# Patient Record
Sex: Male | Born: 1960 | Race: Black or African American | Hispanic: No | Smoking: Current every day smoker
Health system: Southern US, Community
[De-identification: ages and names within clinical notes are randomized; demographics above are authoritative.]

## PROBLEM LIST (undated history)

## (undated) ENCOUNTER — Ambulatory Visit: Discharge status: Absent without leave | Payer: No Typology Code available for payment source

## (undated) DIAGNOSIS — M549 Dorsalgia, unspecified: Secondary | ICD-10-CM

## (undated) DIAGNOSIS — G8929 Other chronic pain: Secondary | ICD-10-CM

## (undated) DIAGNOSIS — F329 Major depressive disorder, single episode, unspecified: Secondary | ICD-10-CM

## (undated) DIAGNOSIS — G4730 Sleep apnea, unspecified: Secondary | ICD-10-CM

## (undated) DIAGNOSIS — F4310 Post-traumatic stress disorder, unspecified: Secondary | ICD-10-CM

## (undated) DIAGNOSIS — I10 Essential (primary) hypertension: Secondary | ICD-10-CM

## (undated) DIAGNOSIS — J449 Chronic obstructive pulmonary disease, unspecified: Secondary | ICD-10-CM

## (undated) DIAGNOSIS — C801 Malignant (primary) neoplasm, unspecified: Secondary | ICD-10-CM

## (undated) DIAGNOSIS — F32A Depression, unspecified: Secondary | ICD-10-CM

## (undated) HISTORY — PX: COLONOSCOPY: SHX174

## (undated) HISTORY — PX: TONSILLECTOMY: SUR1361

## (undated) HISTORY — PX: APPENDECTOMY: SHX54

---

## 2001-02-22 ENCOUNTER — Encounter: Payer: Self-pay | Admitting: Emergency Medicine

## 2001-02-22 ENCOUNTER — Emergency Department (HOSPITAL_COMMUNITY): Admission: EM | Admit: 2001-02-22 | Discharge: 2001-02-22 | Payer: Self-pay | Admitting: Emergency Medicine

## 2001-04-25 ENCOUNTER — Emergency Department (HOSPITAL_COMMUNITY): Admission: EM | Admit: 2001-04-25 | Discharge: 2001-04-25 | Payer: Self-pay | Admitting: Emergency Medicine

## 2001-05-13 ENCOUNTER — Emergency Department (HOSPITAL_COMMUNITY): Admission: EM | Admit: 2001-05-13 | Discharge: 2001-05-13 | Payer: Self-pay | Admitting: *Deleted

## 2001-10-05 ENCOUNTER — Emergency Department (HOSPITAL_COMMUNITY): Admission: EM | Admit: 2001-10-05 | Discharge: 2001-10-05 | Payer: Self-pay | Admitting: Emergency Medicine

## 2002-03-24 ENCOUNTER — Emergency Department (HOSPITAL_COMMUNITY): Admission: EM | Admit: 2002-03-24 | Discharge: 2002-03-25 | Payer: Self-pay | Admitting: *Deleted

## 2002-04-03 ENCOUNTER — Emergency Department (HOSPITAL_COMMUNITY): Admission: EM | Admit: 2002-04-03 | Discharge: 2002-04-03 | Payer: Self-pay | Admitting: Internal Medicine

## 2003-04-05 ENCOUNTER — Encounter: Payer: Self-pay | Admitting: Emergency Medicine

## 2003-04-05 ENCOUNTER — Emergency Department (HOSPITAL_COMMUNITY): Admission: EM | Admit: 2003-04-05 | Discharge: 2003-04-05 | Payer: Self-pay | Admitting: Emergency Medicine

## 2003-11-25 ENCOUNTER — Emergency Department (HOSPITAL_COMMUNITY): Admission: EM | Admit: 2003-11-25 | Discharge: 2003-11-25 | Payer: Self-pay | Admitting: Emergency Medicine

## 2004-08-13 ENCOUNTER — Emergency Department (HOSPITAL_COMMUNITY): Admission: EM | Admit: 2004-08-13 | Discharge: 2004-08-13 | Payer: Self-pay

## 2004-08-16 ENCOUNTER — Emergency Department (HOSPITAL_COMMUNITY): Admission: EM | Admit: 2004-08-16 | Discharge: 2004-08-16 | Payer: Self-pay | Admitting: Emergency Medicine

## 2006-11-14 ENCOUNTER — Emergency Department (HOSPITAL_COMMUNITY): Admission: EM | Admit: 2006-11-14 | Discharge: 2006-11-14 | Payer: Self-pay | Admitting: Emergency Medicine

## 2007-05-17 ENCOUNTER — Emergency Department (HOSPITAL_COMMUNITY): Admission: EM | Admit: 2007-05-17 | Discharge: 2007-05-17 | Payer: Self-pay | Admitting: Emergency Medicine

## 2007-05-19 ENCOUNTER — Emergency Department (HOSPITAL_COMMUNITY): Admission: EM | Admit: 2007-05-19 | Discharge: 2007-05-19 | Payer: Self-pay | Admitting: Emergency Medicine

## 2008-02-23 ENCOUNTER — Emergency Department (HOSPITAL_COMMUNITY): Admission: EM | Admit: 2008-02-23 | Discharge: 2008-02-23 | Payer: Self-pay | Admitting: Emergency Medicine

## 2008-02-28 ENCOUNTER — Emergency Department (HOSPITAL_COMMUNITY): Admission: EM | Admit: 2008-02-28 | Discharge: 2008-02-28 | Payer: Self-pay | Admitting: Emergency Medicine

## 2008-12-23 ENCOUNTER — Emergency Department (HOSPITAL_COMMUNITY): Admission: EM | Admit: 2008-12-23 | Discharge: 2008-12-23 | Payer: Self-pay | Admitting: Emergency Medicine

## 2011-07-06 ENCOUNTER — Encounter: Payer: Self-pay | Admitting: *Deleted

## 2011-07-06 ENCOUNTER — Emergency Department (HOSPITAL_COMMUNITY)
Admission: EM | Admit: 2011-07-06 | Discharge: 2011-07-07 | Disposition: A | Discharge status: Rehabilitation Facility | Payer: Non-veteran care | Attending: Emergency Medicine | Admitting: Emergency Medicine

## 2011-07-06 ENCOUNTER — Emergency Department (HOSPITAL_COMMUNITY): Payer: Non-veteran care

## 2011-07-06 DIAGNOSIS — G8929 Other chronic pain: Secondary | ICD-10-CM | POA: Insufficient documentation

## 2011-07-06 DIAGNOSIS — F17200 Nicotine dependence, unspecified, uncomplicated: Secondary | ICD-10-CM | POA: Insufficient documentation

## 2011-07-06 DIAGNOSIS — F1010 Alcohol abuse, uncomplicated: Secondary | ICD-10-CM | POA: Insufficient documentation

## 2011-07-06 DIAGNOSIS — M549 Dorsalgia, unspecified: Secondary | ICD-10-CM | POA: Insufficient documentation

## 2011-07-06 DIAGNOSIS — F1910 Other psychoactive substance abuse, uncomplicated: Secondary | ICD-10-CM

## 2011-07-06 HISTORY — DX: Essential (primary) hypertension: I10

## 2011-07-06 HISTORY — DX: Chronic obstructive pulmonary disease, unspecified: J44.9

## 2011-07-06 LAB — BASIC METABOLIC PANEL
CO2: 25 mEq/L (ref 19–32)
Chloride: 100 mEq/L (ref 96–112)
Creatinine, Ser: 0.7 mg/dL (ref 0.50–1.35)
GFR calc Af Amer: >90 mL/min (ref >90)
Potassium: 3.9 mEq/L (ref 3.5–5.1)
Sodium: 139 mEq/L (ref 135–145)

## 2011-07-06 LAB — CBC
MCHC: 35 g/dL (ref 30.0–36.0)
Platelets: 180 10*3/uL (ref 150–400)
RDW: 14.6 % (ref 11.5–15.5)
WBC: 8.9 10*3/uL (ref 4.0–10.5)

## 2011-07-06 LAB — DIFFERENTIAL
Basophils Absolute: 0.1 10*3/uL (ref 0.0–0.1)
Basophils Relative: 1 % (ref 0–1)
Lymphocytes Relative: 30 % (ref 12–46)
Neutro Abs: 5.4 10*3/uL (ref 1.7–7.7)
Neutrophils Relative %: 60 % (ref 43–77)

## 2011-07-06 LAB — URINALYSIS, ROUTINE W REFLEX MICROSCOPIC
Bilirubin Urine: NEGATIVE
Leukocytes, UA: NEGATIVE
Nitrite: NEGATIVE
Specific Gravity, Urine: 1.02 (ref 1.005–1.030)
Urobilinogen, UA: 0.2 mg/dL (ref 0.0–1.0)

## 2011-07-06 LAB — HEPATIC FUNCTION PANEL
ALT: 47 U/L (ref 0–53)
AST: 56 U/L — ABNORMAL HIGH (ref 0–37)
Alkaline Phosphatase: 88 U/L (ref 39–117)
Bilirubin, Direct: 0.1 mg/dL (ref 0.0–0.3)
Total Protein: 7.9 g/dL (ref 6.0–8.3)

## 2011-07-06 LAB — ETHANOL: Alcohol, Ethyl (B): 273 mg/dL — ABNORMAL HIGH (ref 0–11)

## 2011-07-06 LAB — RAPID URINE DRUG SCREEN, HOSP PERFORMED
Barbiturates: NOT DETECTED
Cocaine: POSITIVE — AB

## 2011-07-06 MED ORDER — THIAMINE HCL 100 MG/ML IJ SOLN
100.0000 mg | Freq: Every day | INTRAMUSCULAR | Status: DC
  Administered 2011-07-06: 100 mg via INTRAVENOUS
  Filled 2011-07-06: qty 2

## 2011-07-06 MED ORDER — FOLIC ACID 1 MG PO TABS
1.0000 mg | ORAL_TABLET | Freq: Every day | ORAL | Status: DC
  Administered 2011-07-06: 1 mg via ORAL
  Filled 2011-07-06: qty 1

## 2011-07-06 MED ORDER — ACETAMINOPHEN 325 MG PO TABS: 650.0000 mg | ORAL_TABLET | ORAL | Status: DC | PRN

## 2011-07-06 MED ORDER — VITAMIN B-1 100 MG PO TABS
100.0000 mg | ORAL_TABLET | Freq: Every day | ORAL | Status: DC
  Filled 2011-07-06: qty 1

## 2011-07-06 MED ORDER — ZOLPIDEM TARTRATE 5 MG PO TABS: 5.0000 mg | ORAL_TABLET | Freq: Every evening | ORAL | Status: DC | PRN

## 2011-07-06 MED ORDER — LORAZEPAM 1 MG PO TABS: 1.0000 mg | ORAL_TABLET | Freq: Four times a day (QID) | ORAL | Status: DC | PRN

## 2011-07-06 MED ORDER — IBUPROFEN 400 MG PO TABS: 600.0000 mg | ORAL_TABLET | Freq: Three times a day (TID) | ORAL | Status: DC | PRN

## 2011-07-06 MED ORDER — LORAZEPAM 2 MG/ML IJ SOLN: 1.0000 mg | Freq: Four times a day (QID) | INTRAMUSCULAR | Status: DC | PRN

## 2011-07-06 MED ORDER — ALUM & MAG HYDROXIDE-SIMETH 200-200-20 MG/5ML PO SUSP: 30.0000 mL | ORAL | Status: DC | PRN

## 2011-07-06 MED ORDER — LORAZEPAM 1 MG PO TABS: 1.0000 mg | ORAL_TABLET | Freq: Three times a day (TID) | ORAL | Status: DC | PRN

## 2011-07-06 MED ORDER — ONDANSETRON HCL 4 MG PO TABS: 4.0000 mg | ORAL_TABLET | Freq: Three times a day (TID) | ORAL | Status: DC | PRN

## 2011-07-06 MED ORDER — NICOTINE 21 MG/24HR TD PT24
21.0000 mg | MEDICATED_PATCH | Freq: Every day | TRANSDERMAL | Status: DC
  Administered 2011-07-06: 21 mg via TRANSDERMAL
  Filled 2011-07-06: qty 1

## 2011-07-06 MED ORDER — THERA M PLUS PO TABS
1.0000 | ORAL_TABLET | Freq: Every day | ORAL | Status: DC
  Administered 2011-07-06: 1 via ORAL
  Filled 2011-07-06: qty 1

## 2011-07-06 NOTE — ED Notes (Signed)
Resting quietly in bed with eyes closed. Resp even and unlabored. No s/s pain or discomfort noted.

## 2011-07-06 NOTE — ED Notes (Signed)
Shon Baton (ACT) is coming to see the PT.

## 2011-07-06 NOTE — ED Notes (Signed)
Pt standing eating lunch. Officer at bedside. Pt made aware of process regarding eval

## 2011-07-06 NOTE — ED Notes (Signed)
Remains A&Ox3. Skin w/p/d. Resp even and unlabored. Denies pain. NAD noted at this time.

## 2011-07-06 NOTE — ED Provider Notes (Signed)
History     CSN: [id number] Arrival date & time: 07/06/2011  1:16 PM  Chief Complaint  Patient presents with  . Psychiatric Evaluation    (Consider location/radiation/quality/duration/timing/severity/associated sxs/prior treatment) HPI Comments: Patient presents via [facility] for psychiatric evaluation. Involuntary commitment papers were filed by the patient's mother. Patient's mother states that he has a hard time remembering things and has a severe drug problem. States he uses alcohol as well as every drug on the market. CT most had a hard time remembering when he is under the influence of substances. He at times is violence. When talking to the patient he states that he intermittently has thoughts of self harm as well as harm to others although he has no specific plans or individuals - states has these thoughts when intoxicated which he was on arrival. He has no medical complaints. His last drink was today.  The history is provided by the patient and the police. No language interpreter was used.    Past Medical History  Diagnosis Date  . COPD (chronic obstructive pulmonary disease)   . Hypertension     Past Surgical History  Procedure Date  . Appendectomy   . Tonsillectomy     History reviewed. No pertinent family history.  History  Substance Use Topics  . Smoking status: Current Everyday Smoker -- 1.5 packs/day  . Smokeless tobacco: Not on file  . Alcohol Use: Yes     4 40 oz beers per day.       Review of Systems  Constitutional: Negative for fever, activity change, appetite change and fatigue.  HENT: Negative for congestion, sore throat, rhinorrhea, neck pain and neck stiffness.   Respiratory: Negative for cough, chest tightness and shortness of breath.   Cardiovascular: Negative for chest pain and palpitations.  Gastrointestinal: Negative for nausea, vomiting and abdominal pain.  Genitourinary: Negative for dysuria, urgency, frequency and flank pain.    Musculoskeletal: Positive for back pain (chronic). Negative for gait problem.  Neurological: Negative for dizziness, weakness, light-headedness and headaches.       +memory difficulty  Psychiatric/Behavioral: Positive for suicidal ideas (vague), confusion and agitation. Negative for hallucinations. The patient is not nervous/anxious.   All other systems reviewed and are negative.    Allergies  Review of patient's allergies indicates no known allergies.  Home Medications  No current outpatient prescriptions on file.  BP 155/84  Pulse 85  Temp(Src) 98.2 F (36.8 C) (Oral)  Resp 20  Ht 5\' 8"  (1.727 m)  Wt 163 lb (73.936 kg)  BMI 24.78 kg/m2  SpO2 99%  Physical Exam  Nursing note and vitals reviewed. Constitutional: He is oriented to person, place, and time. He appears well-developed and well-nourished. No distress.       Alcohol on the breath  HENT:  Head: Normocephalic and atraumatic.  Mouth/Throat: Oropharynx is clear and moist.  Eyes: Conjunctivae and EOM are normal. Pupils are equal, round, and reactive to light.  Neck: Normal range of motion. Neck supple.  Cardiovascular: Normal rate, regular rhythm, normal heart sounds and intact distal pulses.  Exam reveals no gallop and no friction rub.   No murmur heard. Pulmonary/Chest: Effort normal and breath sounds normal. No respiratory distress. He has no wheezes. He has no rales.  Abdominal: Soft. Bowel sounds are normal. There is no tenderness.  Musculoskeletal: Normal range of motion. He exhibits no tenderness.  Neurological: He is alert and oriented to person, place, and time.  Skin: Skin is warm and dry. No rash  noted.    ED Course  Procedures (including critical care time)  Labs Reviewed  BASIC METABOLIC PANEL - Abnormal; Notable for the following:    Glucose, Bld 110 (*)    All other components within normal limits  ETHANOL - Abnormal; Notable for the following:    Alcohol, Ethyl (B) 273 (*)    All other  components within normal limits  URINE RAPID DRUG SCREEN (HOSP PERFORMED) - Abnormal; Notable for the following:    Cocaine POSITIVE (*)    All other components within normal limits  HEPATIC FUNCTION PANEL - Abnormal; Notable for the following:    AST 56 (*)    All other components within normal limits  CBC  DIFFERENTIAL  URINALYSIS, ROUTINE W REFLEX MICROSCOPIC   Ct Head Wo Contrast  07/06/2011  *RADIOLOGY REPORT*  Clinical Data: 50 year old male - altered mental status.  CT HEAD WITHOUT CONTRAST  Technique:  Contiguous axial images were obtained from the base of the skull through the vertex without contrast.  Comparison: None  Findings: No acute intracranial abnormalities are identified, including mass lesion or mass effect, hydrocephalus, extra-axial fluid collection, midline shift, hemorrhage, or acute infarction.  The visualized bony calvarium is unremarkable.  IMPRESSION: No evidence of intracranial abnormality.  Original Report Authenticated By: Rosendo Gros, M.D.     1. Alcohol abuse   2. Polysubstance abuse       MDM  Medical clearance labs were obtained. Given the increased confusion I also obtained a head CT. Head CT was negative. Medical clearance labs reviewed relatively unremarkable except for an alcohol level of 270. The patient did arrive intoxicated. I feel that the majority of his symptoms are likely intoxication related. I will consult the behavioral health team for further evaluation. I do not feel the patient is a serious threat to himself or others as he only has vague thoughts when intoxicated. He has no medical complaints. The patient was brought by involuntary commitment papers. Patient has no criteria for this and the paperwork will be rescinded. When the patient discussed his overall care with the behavioral health team he states he wishes to have help with detox. At this time we are awaiting to hear back from 2 facilities on whether or not there is an inpatient bed  for which he can receive detox. Once again the patient is not a harm to himself or others. Psych holding orders were placed. Patient was also placed in ciwa precautions. Patient will be transferred to the facility who accepts him for inpatient rehabilitation        Dayton Bailiff, MD 07/06/11 2118

## 2011-07-06 NOTE — ED Notes (Signed)
Pt brought in by police for involuntary committment. Pt drinks approximately 4 40 oz beers per day. Per patients mother he has a hard time remembering things. Pt states that he likes drinking.

## 2011-07-08 ENCOUNTER — Emergency Department: Payer: Self-pay | Admitting: Emergency Medicine

## 2011-07-13 LAB — COMPREHENSIVE METABOLIC PANEL
CO2: 27
Calcium: 9.1
Creatinine, Ser: 0.63
GFR calc non Af Amer: >60
Glucose, Bld: 104 — ABNORMAL HIGH
Total Protein: 7.1

## 2011-07-13 LAB — DIFFERENTIAL
Lymphocytes Relative: 26
Lymphs Abs: 2.1
Monocytes Relative: 9
Neutro Abs: 5
Neutrophils Relative %: 61

## 2011-07-13 LAB — CBC
Hemoglobin: 13.9
MCHC: 33.7
MCV: 87.9
RBC: 4.69
RDW: 14.4 — ABNORMAL HIGH

## 2011-07-13 LAB — APTT: aPTT: 31

## 2011-07-13 LAB — PROTIME-INR
INR: 0.9
Prothrombin Time: 12.1

## 2011-12-04 ENCOUNTER — Emergency Department (HOSPITAL_COMMUNITY)
Admission: EM | Admit: 2011-12-04 | Discharge: 2011-12-05 | Disposition: A | Discharge status: Home / Self Care | Payer: Non-veteran care | Attending: Emergency Medicine | Admitting: Emergency Medicine

## 2011-12-04 ENCOUNTER — Encounter (HOSPITAL_COMMUNITY): Payer: Self-pay | Admitting: *Deleted

## 2011-12-04 DIAGNOSIS — F17200 Nicotine dependence, unspecified, uncomplicated: Secondary | ICD-10-CM | POA: Insufficient documentation

## 2011-12-04 DIAGNOSIS — F329 Major depressive disorder, single episode, unspecified: Secondary | ICD-10-CM | POA: Insufficient documentation

## 2011-12-04 DIAGNOSIS — F1010 Alcohol abuse, uncomplicated: Secondary | ICD-10-CM | POA: Insufficient documentation

## 2011-12-04 DIAGNOSIS — I10 Essential (primary) hypertension: Secondary | ICD-10-CM | POA: Insufficient documentation

## 2011-12-04 DIAGNOSIS — J4489 Other specified chronic obstructive pulmonary disease: Secondary | ICD-10-CM | POA: Insufficient documentation

## 2011-12-04 DIAGNOSIS — F1410 Cocaine abuse, uncomplicated: Secondary | ICD-10-CM | POA: Insufficient documentation

## 2011-12-04 DIAGNOSIS — J449 Chronic obstructive pulmonary disease, unspecified: Secondary | ICD-10-CM | POA: Insufficient documentation

## 2011-12-04 DIAGNOSIS — F3289 Other specified depressive episodes: Secondary | ICD-10-CM | POA: Insufficient documentation

## 2011-12-04 HISTORY — DX: Post-traumatic stress disorder, unspecified: F43.10

## 2011-12-04 HISTORY — DX: Depression, unspecified: F32.A

## 2011-12-04 HISTORY — DX: Major depressive disorder, single episode, unspecified: F32.9

## 2011-12-04 LAB — COMPREHENSIVE METABOLIC PANEL
ALT: 31 U/L (ref 0–53)
AST: 31 U/L (ref 0–37)
Alkaline Phosphatase: 88 U/L (ref 39–117)
CO2: 30 mEq/L (ref 19–32)
Calcium: 8.7 mg/dL (ref 8.4–10.5)
Chloride: 101 mEq/L (ref 96–112)
GFR calc non Af Amer: 75 mL/min — ABNORMAL LOW (ref >90)
Potassium: 3.9 mEq/L (ref 3.5–5.1)
Sodium: 138 mEq/L (ref 135–145)

## 2011-12-04 LAB — CBC
MCH: 27.9 pg (ref 26.0–34.0)
Platelets: 213 10*3/uL (ref 150–400)
RBC: 4.88 MIL/uL (ref 4.22–5.81)
WBC: 7.9 10*3/uL (ref 4.0–10.5)

## 2011-12-04 LAB — ETHANOL: Alcohol, Ethyl (B): 20 mg/dL — ABNORMAL HIGH (ref 0–11)

## 2011-12-04 LAB — RAPID URINE DRUG SCREEN, HOSP PERFORMED
Barbiturates: NOT DETECTED
Cocaine: POSITIVE — AB
Tetrahydrocannabinol: NOT DETECTED

## 2011-12-04 MED ORDER — FOLIC ACID 1 MG PO TABS
1.0000 mg | ORAL_TABLET | Freq: Every day | ORAL | Status: DC
  Administered 2011-12-04 – 2011-12-05 (×2): 1 mg via ORAL
  Filled 2011-12-04 (×2): qty 1

## 2011-12-04 MED ORDER — IBUPROFEN 600 MG PO TABS: 600.0000 mg | ORAL_TABLET | Freq: Three times a day (TID) | ORAL | Status: DC | PRN

## 2011-12-04 MED ORDER — LORAZEPAM 1 MG PO TABS
0.0000 mg | ORAL_TABLET | Freq: Four times a day (QID) | ORAL | Status: DC
  Administered 2011-12-04: 1 mg via ORAL

## 2011-12-04 MED ORDER — LORAZEPAM 1 MG PO TABS
1.0000 mg | ORAL_TABLET | Freq: Four times a day (QID) | ORAL | Status: DC | PRN
  Filled 2011-12-04: qty 1

## 2011-12-04 MED ORDER — LORAZEPAM 2 MG/ML IJ SOLN: 1.0000 mg | Freq: Four times a day (QID) | INTRAMUSCULAR | Status: DC | PRN

## 2011-12-04 MED ORDER — THIAMINE HCL 100 MG/ML IJ SOLN: 100.0000 mg | Freq: Every day | INTRAMUSCULAR | Status: DC

## 2011-12-04 MED ORDER — LORAZEPAM 1 MG PO TABS
0.0000 mg | ORAL_TABLET | Freq: Two times a day (BID) | ORAL | Status: DC
Start: 2011-12-06 — End: 2011-12-05

## 2011-12-04 MED ORDER — VITAMIN B-1 100 MG PO TABS
100.0000 mg | ORAL_TABLET | Freq: Every day | ORAL | Status: DC
  Administered 2011-12-04 – 2011-12-05 (×2): 100 mg via ORAL
  Filled 2011-12-04 (×2): qty 1

## 2011-12-04 MED ORDER — ONDANSETRON HCL 4 MG PO TABS
4.0000 mg | ORAL_TABLET | Freq: Three times a day (TID) | ORAL | Status: DC | PRN
Start: 2011-12-04 — End: 2011-12-05

## 2011-12-04 MED ORDER — NICOTINE 21 MG/24HR TD PT24
21.0000 mg | MEDICATED_PATCH | Freq: Every day | TRANSDERMAL | Status: DC
  Administered 2011-12-04 – 2011-12-05 (×2): 21 mg via TRANSDERMAL
  Filled 2011-12-04 (×2): qty 1

## 2011-12-04 MED ORDER — ALUM & MAG HYDROXIDE-SIMETH 200-200-20 MG/5ML PO SUSP: 30.0000 mL | ORAL | Status: DC | PRN

## 2011-12-04 MED ORDER — ADULT MULTIVITAMIN W/MINERALS CH
1.0000 | ORAL_TABLET | Freq: Every day | ORAL | Status: DC
  Administered 2011-12-04 – 2011-12-05 (×2): 1 via ORAL
  Filled 2011-12-04 (×2): qty 1

## 2011-12-04 NOTE — ED Notes (Signed)
This Tech watched as patient undressed. Two patient belonging bags were searched and patient was wanded by security.

## 2011-12-04 NOTE — ED Notes (Signed)
Pt states "I need to get into detox or something, I feel like I want to hurt somebody, I've been to the [place] before for 20 days"

## 2011-12-04 NOTE — ED Provider Notes (Signed)
History     CSN: [id number]  Arrival date & time 12/04/11  1023   First MD Initiated Contact with Patient 12/04/11 1046      Chief Complaint  Patient presents with  . Medical Clearance    (Consider location/radiation/quality/duration/timing/severity/associated sxs/prior treatment) HPI History provided by pt.   Pt presents w/ homicidal ideation x 3-4 days.  Does not want to hurt anyone in particular.  Attributes to being paranoid that someone is out to hurt him.  Denies SI though was hospitalized at [facility] for SI 2 months ago.  Pt is treated for depression and PTSD; does not know what medications he takes.  Has had recent medication changes.  Also requests alcohol detox.  Drinks approx one 6 pack of beer a day, most recent drink a 24oz beer this morning.  No h/o DTs.  Has been detoxed once in the past, several years ago.  Admits to using cocaine but does not abuse any other illicit drugs.    Past Medical History  Diagnosis Date  . COPD (chronic obstructive pulmonary disease)   . Hypertension   . PTSD (post-traumatic stress disorder)     Past Surgical History  Procedure Date  . Appendectomy   . Tonsillectomy     History reviewed. No pertinent family history.  History  Substance Use Topics  . Smoking status: Current Everyday Smoker -- 1.5 packs/day  . Smokeless tobacco: Not on file  . Alcohol Use: Yes     4 40 oz beers per day.       Review of Systems  All other systems reviewed and are negative.    Allergies  Review of patient's allergies indicates no known allergies.  Home Medications   Current Outpatient Rx  Name Route Sig Dispense Refill  . HYDROCODONE-ACETAMINOPHEN 5-500 MG PO TABS Oral Take 1 tablet by mouth every 6 (six) hours as needed.      BP 143/85  Pulse 90  Temp(Src) 98.2 F (36.8 C) (Oral)  Resp 16  SpO2 100%  Physical Exam  Nursing note and vitals reviewed. Constitutional: He is oriented to person, place, and time. He appears well-developed  and well-nourished. No distress.  HENT:  Head: Normocephalic and atraumatic.  Eyes:       Normal appearance  Neck: Normal range of motion.  Pulmonary/Chest: Effort normal.  Musculoskeletal: Normal range of motion.  Neurological: He is alert and oriented to person, place, and time.  Psychiatric: His behavior is normal.       Depressed mood    ED Course  Procedures (including critical care time)  Labs Reviewed  COMPREHENSIVE METABOLIC PANEL - Abnormal; Notable for the following:    GFR calc non Af Amer 75 (*)    GFR calc Af Amer 87 (*)    All other components within normal limits  ETHANOL - Abnormal; Notable for the following:    Alcohol, Ethyl (B) 192 (*)    All other components within normal limits  URINE RAPID DRUG SCREEN (HOSP PERFORMED) - Abnormal; Notable for the following:    Cocaine POSITIVE (*)    All other components within normal limits  ETHANOL - Abnormal; Notable for the following:    Alcohol, Ethyl (B) 20 (*)    All other components within normal limits  CBC   No results found.   1. Alcohol abuse       MDM  Pt presents w/ c/o homicidal ideation and is requesting alcohol detox as well.  He has been cleared medically.  ACT team consulted and holding orders including alcohol withdrawal protocol written.  Pt moved to psych ED.  Dr. Web aware of patient.         Otilio Miu, [place] 12/04/11 2001

## 2011-12-04 NOTE — ED Notes (Signed)
Pt information has been sent to the [facility] for review. CSW/ACT to follow up with [facility] to ensure pt has been added to the wait list.

## 2011-12-04 NOTE — ED Notes (Signed)
Pt in stating he wants to hurt someone, states he is hearing voices and they are telling him to hurt someone because someone will hurt him, states his thought to hurt someone is not specific, also wants ETOH detox, states he is compliant with his medication but has been drinking with it

## 2011-12-04 NOTE — BH Assessment (Signed)
Assessment Note   Jason Rojas is an 51 y.o. male. Pt here at [facility] stating he wants to hurt someone. Pt denies plan or victim. Sts he feels paranoid and has urges to harm others. Pt has no history of harm to others. States he is hearing voices and they are telling him to hurt someone because someone will hurt him. Auditory hallucinations have been on-going x3 days. He has a history of hearing voices. He is currently taking meds for the psychotic symptoms, however; unable to provide the name, dosage, frequency of the medication. Pt however, reports compliance with the unknown psychotrophic medication. He has passive suicidal thoughts. No specified plan or intent. He also wants ETOH detox. Pt drinking a 6 pack of beer daily x10 yrs. His last drink was this morning and he drank (1) 24oz beer. Patient also uses cocaine 1x per month; last used this morning. Pt is unable to contract for safety against himself and others. He has VA medical benefits and will be referred to the [facility] in [place].  LCSW verified bed availability. Staff at the [facility] state that they have approx. 3 beds available at this time. Referral in process...Marland KitchenMarland KitchenMarland Kitchen  Axis I: Psychotic Disorder NOS; Alcohol Abuse Axis II: Deferred Axis III:  Past Medical History  Diagnosis Date  . COPD (chronic obstructive pulmonary disease)   . Hypertension   . PTSD (post-traumatic stress disorder)   . Depression    Axis IV: other psychosocial or environmental problems, problems related to social environment, problems with access to health care services and problems with primary support group Axis V: 21-30 behavior considerably influenced by delusions or hallucinations OR serious impairment in judgment, communication OR inability to function in almost all areas  Past Medical History:  Past Medical History  Diagnosis Date  . COPD (chronic obstructive pulmonary disease)   . Hypertension   . PTSD (post-traumatic stress disorder)   . Depression      Past Surgical History  Procedure Date  . Appendectomy   . Tonsillectomy     Family History: History reviewed. No pertinent family history.  Social History:  reports that he has been smoking.  He does not have any smokeless tobacco history on file. He reports that he drinks alcohol. He reports that he uses illicit drugs (Cocaine).  Additional Social History:  Alcohol / Drug Use Pain Medications: Pt unable to provide names, dosages, frequency of the medications that he takes. Sts he takes "Something for HBP, voices, and vitamins". Prescriptions: Pt unable to provide names, dosages, frequency of the medications that he takes. Sts he takes "Something for HBP, voices, and vitamins". Over the Counter: Pt unable to provide names, dosages, frequency of the medications that he takes. Sts he takes "Something for HBP, voices, and vitamins". History of alcohol / drug use?: Yes Substance #1 Name of Substance 1: Alcohol 1 - Age of First Use: early 20's 1 - Amount (size/oz): 6 pack per day 1 - Frequency: daily 1 - Duration: past 10 yrs daily 1 - Last Use / Amount: 12/04/2010; "this morning"; 24ox beer Substance #2 Name of Substance 2: Cocaine 2 - Age of First Use: Pt sts, "I can't remember" 2 - Amount (size/oz): $20 worth  2 - Frequency: 1x per month 2 - Duration: on-going for several yrs per patient 2 - Last Use / Amount: 12/03/2011 Allergies: No Known Allergies  Home Medications:  Medications Prior to Admission  Medication Dose Route Frequency Provider Last Rate Last Dose  .  alum & mag hydroxide-simeth (MAALOX/MYLANTA) 200-200-20 MG/5ML suspension 30 mL  30 mL Oral PRN Otilio Miu, PA      . folic acid (FOLVITE) tablet 1 mg  1 mg Oral Daily Arie Sabina Schinlever, PA   1 mg at 12/04/11 1204  . ibuprofen (ADVIL,MOTRIN) tablet 600 mg  600 mg Oral Q8H PRN Otilio Miu, PA      . LORazepam (ATIVAN) tablet 1 mg  1 mg Oral Q6H PRN Otilio Miu, PA       Or  .  LORazepam (ATIVAN) injection 1 mg  1 mg Intravenous Q6H PRN Otilio Miu, PA      . LORazepam (ATIVAN) tablet 0-4 mg  0-4 mg Oral Q6H Arie Sabina Schinlever, PA   1 mg at 12/04/11 1210   Followed by  . LORazepam (ATIVAN) tablet 0-4 mg  0-4 mg Oral Q12H Otilio Miu, PA      . mulitivitamin with minerals tablet 1 tablet  1 tablet Oral Daily Otilio Miu, PA   1 tablet at 12/04/11 1204  . nicotine (NICODERM CQ - dosed in mg/24 hours) patch 21 mg  21 mg Transdermal Daily Otilio Miu, PA   21 mg at 12/04/11 1204  . ondansetron (ZOFRAN) tablet 4 mg  4 mg Oral Q8H PRN Otilio Miu, PA      . thiamine (VITAMIN B-1) tablet 100 mg  100 mg Oral Daily Otilio Miu, PA   100 mg at 12/04/11 1204   Or  . thiamine (B-1) injection 100 mg  100 mg Intravenous Daily Otilio Miu, PA       No current outpatient prescriptions on file as of 12/04/2011.    OB/GYN Status:  No LMP for male patient.  General Assessment Data Location of Assessment: [facility] ACT Assessment:  (No) Living Arrangements: Alone Can pt return to current living arrangement?: Yes Admission Status: Voluntary Is patient capable of signing voluntary admission?: Yes Transfer from: Acute Hospital Referral Source: Self/Family/Friend     Risk to self Suicidal Ideation: Yes-Currently Present Suicidal Intent: Yes-Currently Present Is patient at risk for suicide?: Yes Suicidal Plan?: No Access to Means: No What has been your use of drugs/alcohol within the last 12 months?:  (pt reports daily alcohol use and cocaine use 1x per month) Previous Attempts/Gestures: No How many times?:  (0) Other Self Harm Risks:  (n/a) Triggers for Past Attempts:  (no previous attempts or gestures to harm self) Intentional Self Injurious Behavior: None Family Suicide History: No Recent stressful life event(s):  (pt denies) Persecutory voices/beliefs?: No Depression: Yes Depression Symptoms:  Despondent;Loss of interest in usual pleasures Substance abuse history and/or treatment for substance abuse?: Yes Suicide prevention information given to non-admitted patients: Not applicable  Risk to Others Homicidal Ideation: Yes-Currently Present Thoughts of Harm to Others: Yes-Currently Present Comment - Thoughts of Harm to Others:  (on-going thoughts to harm others x3 days;Aud. hallucinations) Current Homicidal Intent: Yes-Currently Present Current Homicidal Plan: No Access to Homicidal Means: No Identified Victim:  (no victim) History of harm to others?: No Assessment of Violence:  (pt calm and cooperative here in the ED; no hx of violence) Violent Behavior Description:  (n/a) Does patient have access to weapons?: No Criminal Charges Pending?: No Does patient have a court date: No  Psychosis Hallucinations: Auditory ("voices tell me to kill myself and others"; pt has hx) Delusions: Unspecified (Auditory hallucinations to harm others)  Mental Status Report Appear/Hygiene: Disheveled Eye Contact: Fair  Motor Activity: Freedom of movement Speech: Logical/coherent Level of Consciousness: Alert Mood: Depressed;Preoccupied Affect: Appropriate to circumstance Anxiety Level: Minimal Thought Processes: Relevant Judgement: Impaired Orientation: Person;Place;Time;Situation Obsessive Compulsive Thoughts/Behaviors: None  Cognitive Functioning Concentration: Decreased Memory: Recent Intact IQ: Average Insight: Poor Impulse Control: Good Appetite: Good Weight Loss:  (n/a) Weight Gain:  (sts he weighed 175; now 186) Sleep: Decreased Total Hours of Sleep:  (3.5 to 4 hrs per night) Vegetative Symptoms: None  Prior Inpatient Therapy Prior Inpatient Therapy: Yes Prior Therapy Dates:  (4 months ago-[facility]; 1 month ago [facility] [place]) Prior Therapy Facilty/Provider(s):  ([facility] and [facility]) Reason for Treatment:  (suicidal thoughts, depression, substance abuse)  Prior Outpatient  Therapy Prior Outpatient Therapy: Yes Prior Therapy Dates:  ("several months ago" and "distant past") Prior Therapy Facilty/Provider(s):  ([facility] ) Reason for Treatment:  (Auditory Hallucinations)  ADL Screening (condition at time of admission) Patient's cognitive ability adequate to safely complete daily activities?: Yes Patient able to express need for assistance with ADLs?: Yes Independently performs ADLs?: Yes Weakness of Legs: None Weakness of Arms/Hands: None  Home Assistive Devices/Equipment Home Assistive Devices/Equipment: None    Abuse/Neglect Assessment (Assessment to be complete while patient is alone) Physical Abuse: Denies Verbal Abuse: Denies Sexual Abuse: Denies Exploitation of patient/patient's resources: Denies Self-Neglect: Denies Values / Beliefs Cultural Requests During Hospitalization: None Spiritual Requests During Hospitalization: None   Advance Directives (For Healthcare) Advance Directive: Patient does not have advance directive Nutrition Screen Diet: Regular Unintentional weight loss greater than 10lbs within the last month: No (pt reports wt. gain only. sts he went from 175 to 186) Dysphagia: No Home Tube Feeding or Total Parenteral Nutrition (TPN): No Patient appears severely malnourished: No Pregnant or Lactating: No  Additional Information 1:1 In Past 12 Months?: No CIRT Risk: No Elopement Risk: No Does patient have medical clearance?: Yes     Disposition:  Disposition Disposition of Patient: Inpatient treatment program;Referred to Type of inpatient treatment program: Adult Patient referred to:  (pending referral to [facility] medical center)  On Site Evaluation by:   Reviewed with Physician:     Melynda Ripple [facility] 12/04/2011 5:46 PM

## 2011-12-04 NOTE — ED Provider Notes (Signed)
Patient requesting detox from alcohol. Feels vaguely homicidal and suicidal , however it does not feel homicidal and does not want to hurt any specific individual. Homicidal and suicidal ideations come from inability to get up with his alcohol problem . He feels that if he can get help he will no longer be suicidal or homicidal. Patient is presently alert active and pleasant Glasgow Coma Score 15  Doug Sou, MD 12/04/11 1734

## 2011-12-05 NOTE — Discharge Instructions (Signed)
Do not use alcohol or cocaine. [facility] you intend to do. Also, see referral/resource guide provided.  Follow up with primary care doctor in coming week  - have them also recheck your blood pressure as it is high. Return to ER if worse, new symptoms, severe depression, hearing voices, thoughts of harm to self or others, other concern.     Alcohol Intoxication You have alcohol intoxication when the amount of alcohol that you have consumed has impaired your ability to mentally and physically function. There are a variety of factors that contribute to the level at which alcohol intoxication can occur, such as age, gender, weight, frequency of alcohol consumption, medication use, and the presence of other medical conditions, such as diabetes, seizures, or heart conditions. The blood alcohol level test measures the concentration of alcohol in your blood. In most states, your blood alcohol level must be lower than 80 mg/dL (1.61%) to legally drive. However, many dangerous effects of alcohol can occur at much lower levels. Alcohol directly impairs the normal chemical activity of the brain and is said to be a chemical depressant. Alcohol can cause drowsiness, stupor, respiratory failure, and coma. Other physical effects can include headache, vomiting, vomiting of blood, abdominal pain, a fast heartbeat, difficulty breathing, anxiety, and amnesia. Alcohol intoxication can also lead to dangerous and life-threatening activities, such as fighting, dangerous operation of vehicles or heavy machinery, and risky sexual behavior. Alcohol can be especially dangerous when taken with other drugs. Some of these drugs are:  Sedatives.   Painkillers.   Marijuana.   Tranquilizers.   Antihistamines.   Muscle relaxants.   Seizure medicine.  Many of the effects of acute alcohol intoxication are temporary. However, repeated alcohol intoxication can lead to severe medical  illnesses. If you have alcohol intoxication, you should:  Stay hydrated. Drink enough water and fluids to keep your urine clear or pale yellow. Avoid excessive caffeine because this can further lead to dehydration.   Eat a healthy diet. You may have residual nausea, headache, and loss of appetite, but it is still important that you maintain good nutrition. You can start with clear liquids.   Take nonsteroidal anti-inflammatory medications as needed for headaches, but make sure to do so with small meals. You should avoid acetaminophen for several days after having alcohol intoxication because the combination of alcohol and acetaminophen can be toxic to your liver.  If you have frequent alcohol intoxication, ask your friends and family if they think you have a drinking problem. For further help, contact:  Your caregiver.   Alcoholics Anonymous (AA).   A drug or alcohol rehabilitation program.  SEEK MEDICAL CARE IF:   You have persistent vomiting.   You have persistent pain in any part of your body.   You do not feel better after a few days.  SEEK IMMEDIATE MEDICAL CARE IF:   You become shaky or tremble when you try to stop drinking.   You shake uncontrollably (seizure).   You throw up (vomit) blood. This may be bright red or it may look like black coffee grounds.   You have blood in the stool. This may be bright red or appear as a black, tarry, bad smelling stool.   You become lightheaded or faint.  ANY OF THESE SYMPTOMS MAY REPRESENT A SERIOUS PROBLEM THAT IS AN EMERGENCY. Do not wait to see if the symptoms will go away. Get medical help right away. Call your local emergency  services (911 in [place] DO NOT drive yourself to the hospital. MAKE SURE YOU:   Understand these instructions.   Will watch your condition.   Will get help right away if you are not doing well or get worse.  Document Released: 06/27/2005 Document Revised: 09/06/2011 Document Reviewed: 03/06/2010 [facility]  Patient Information 2012 [place], [place].    Alcohol Problems Most adults who drink alcohol drink in moderation (not a lot) are at low risk for developing problems related to their drinking. However, all drinkers, including low-risk drinkers, should know about the health risks connected with drinking alcohol. RECOMMENDATIONS FOR LOW-RISK DRINKING  Drink in moderation. Moderate drinking is defined as follows:   Men - no more than 2 drinks per day.   Nonpregnant women - no more than 1 drink per day.   Over age 63 - no more than 1 drink per day.  A standard drink is 12 grams of pure alcohol, which is equal to a 12 ounce bottle of beer or wine cooler, a 5 ounce glass of wine, or 1.5 ounces of distilled spirits (such as whiskey, brandy, vodka, or rum).  ABSTAIN FROM (DO NOT DRINK) ALCOHOL:  When pregnant or considering pregnancy.   When taking a medication that interacts with alcohol.   If you are alcohol dependent.   A medical condition that prohibits drinking alcohol (such as ulcer, liver disease, or heart disease).  DISCUSS WITH YOUR CAREGIVER:  If you are at risk for coronary heart disease, discuss the potential benefits and risks of alcohol use: Light to moderate drinking is associated with lower rates of coronary heart disease in certain populations (for example, men over age 47 and postmenopausal women). Infrequent or nondrinkers are advised not to begin light to moderate drinking to reduce the risk of coronary heart disease so as to avoid creating an alcohol-related problem. Similar protective effects can likely be gained through proper diet and exercise.   Women and the elderly have smaller amounts of body water than men. As a result women and the elderly achieve a higher blood alcohol concentration after drinking the same amount of alcohol.   Exposing a fetus to alcohol can cause a broad range of birth defects referred to as Fetal Alcohol Syndrome (FAS) or Alcohol-Related Birth  Defects (ARBD). Although FAS/ARBD is connected with excessive alcohol consumption during pregnancy, studies also have reported neurobehavioral problems in infants born to mothers reporting drinking an average of 1 drink per day during pregnancy.   Heavier drinking (the consumption of more than 4 drinks per occasion by men and more than 3 drinks per occasion by women) impairs learning (cognitive) and psychomotor functions and increases the risk of alcohol-related problems, including accidents and injuries.  CAGE QUESTIONS:   Have you ever felt that you should Cut down on your drinking?   Have people Annoyed you by criticizing your drinking?   Have you ever felt bad or Guilty about your drinking?   Have you ever had a drink first thing in the morning to steady your nerves or get rid of a hangover (Eye opener)?  If you answered positively to any of these questions: You may be at risk for alcohol-related problems if alcohol consumption is:   Men: Greater than 14 drinks per week or more than 4 drinks per occasion.   Women: Greater than 7 drinks per week or more than 3 drinks per occasion.  Do you or your family have a medical history of alcohol-related problems, such as:  Blackouts.  Sexual dysfunction.   Depression.   Trauma.   Liver dysfunction.   Sleep disorders.   Hypertension.   Chronic abdominal pain.   Has your drinking ever caused you problems, such as problems with your family, problems with your work (or school) performance, or accidents/injuries?   Do you have a compulsion to drink or a preoccupation with drinking?   Do you have poor control or are you unable to stop drinking once you have started?   Do you have to drink to avoid withdrawal symptoms?   Do you have problems with withdrawal such as tremors, nausea, sweats, or mood disturbances?   Does it take more alcohol than in the past to get you high?   Do you feel a strong urge to drink?   Do you change your  plans so that you can have a drink?   Do you ever drink in the morning to relieve the shakes or a hangover?  If you have answered a number of the previous questions positively, it may be time for you to talk to your caregivers, family, and friends and see if they think you have a problem. Alcoholism is a chemical dependency that keeps getting worse and will eventually destroy your health and relationships. Many alcoholics end up dead, impoverished, or in prison. This is often the end result of all chemical dependency.  Do not be discouraged if you are not ready to take action immediately.   Decisions to change behavior often involve up and down desires to change and feeling like you cannot decide.   Try to think more seriously about your drinking behavior.   Think of the reasons to quit.  WHERE TO GO FOR ADDITIONAL INFORMATION   The National Institute on Alcohol Abuse and Alcoholism (NIAAA)www.niaaa.nih.gov   [employer] on Alcoholism and Drug Dependence (NCADD)www.ncadd.org   American Society of Addiction Medicine (ASAM)www.https://anderson-johnson.com/  Document Released: 09/17/2005 Document Revised: 09/06/2011 Document Reviewed: 05/05/2008 [facility] Patient Information 2012 [place], [place].    Cocaine Abuse PROBLEMS FROM USING COCAINE:   Highly addictive.   Illegal.   Risk of sudden death.   Heart disease.   Irregular heart beat.   High blood pressure.   Damage to nose and lungs.   Severe agitation.   Hallucinations.   Violent behavior.   Paranoia.   Sexual dysfunction.  Most cocaine users deny that they have a problem with addiction. The biggest problem is admitting that you are dependent on cocaine. Those trying to quit using it may experience depression and withdrawal symptoms. Other withdrawal symptoms include fatigue, suicidal thoughts, sleepiness, restlessness, anxiety, and increased craving for cocaine. There are medications available to help prevent depression associated  with stopping cocaine. Most users will find a support group or treatment program helpful in coming off and staying off cocaine. The best chance to cure cocaine addiction is to go into group therapy and to be in a drug-free environment. It is very important to develop healthy relationships and avoid socializing with people who use or deal drugs. Eat well, and give your body the proper rest and healthy exercise it needs. You may need medication to help treat withdrawal symptoms. Call your caregiver or a drug treatment center for more help.  You may also want to call the [facility] at 800-662-HELP in the [place] SEEK IMMEDIATE MEDICAL CARE IF:  You develop severe chest pain.   You develop shortness of breath.   You develop extreme agitation.  Document Released: 10/25/2004 Document  Revised: 09/06/2011 Document Reviewed: 07/20/2009 [facility] Patient Information 2012 [place], [place].   Hypertension As your heart beats, it forces blood through your arteries. This force is your blood pressure. If the pressure is too high, it is called hypertension (HTN) or high blood pressure. HTN is dangerous because you may have it and not know it. High blood pressure may mean that your heart has to work harder to pump blood. Your arteries may be narrow or stiff. The extra work puts you at risk for heart disease, stroke, and other problems.  Blood pressure consists of two numbers, a higher number over a lower, 110/72, for example. It is stated as "110 over 72." The ideal is below 120 for the top number (systolic) and under 80 for the bottom (diastolic). Write down your blood pressure today. You should pay close attention to your blood pressure if you have certain conditions such as:  Heart failure.   Prior heart attack.   Diabetes   Chronic kidney disease.   Prior stroke.   Multiple risk factors for heart disease.  To see if you have HTN, your blood pressure should be measured while you are  seated with your arm held at the level of the heart. It should be measured at least twice. A one-time elevated blood pressure reading (especially in the Emergency Department) does not mean that you need treatment. There may be conditions in which the blood pressure is different between your right and left arms. It is important to see your caregiver soon for a recheck. Most people have essential hypertension which means that there is not a specific cause. This type of high blood pressure may be lowered by changing lifestyle factors such as:  Stress.   Smoking.   Lack of exercise.   Excessive weight.   Drug/tobacco/alcohol use.   Eating less salt.  Most people do not have symptoms from high blood pressure until it has caused damage to the body. Effective treatment can often prevent, delay or reduce that damage. TREATMENT  When a cause has been identified, treatment for high blood pressure is directed at the cause. There are a large number of medications to treat HTN. These fall into several categories, and your caregiver will help you select the medicines that are best for you. Medications may have side effects. You should review side effects with your caregiver. If your blood pressure stays high after you have made lifestyle changes or started on medicines,   Your medication(s) may need to be changed.   Other problems may need to be addressed.   Be certain you understand your prescriptions, and know how and when to take your medicine.   Be sure to follow up with your caregiver within the time frame advised (usually within two weeks) to have your blood pressure rechecked and to review your medications.   If you are taking more than one medicine to lower your blood pressure, make sure you know how and at what times they should be taken. Taking two medicines at the same time can result in blood pressure that is too low.  SEEK IMMEDIATE MEDICAL CARE IF:  You develop a severe headache,  blurred or changing vision, or confusion.   You have unusual weakness or numbness, or a faint feeling.   You have severe chest or abdominal pain, vomiting, or breathing problems.  MAKE SURE YOU:   Understand these instructions.   Will watch your condition.   Will get help right away if you are not  doing well or get worse.  Document Released: 09/17/2005 Document Revised: 09/06/2011 Document Reviewed: 05/07/2008 [facility] Patient Information 2012 [place], [place].       RESOURCE GUIDE  Dental Problems  Patients with Medicaid: [facility] [phone]                                           [phone] [address] [employer] [address] Phone:  [phone]                                                  Phone:  [phone]  If unable to pay or uninsured, contact:  [facility] or [facility]. to become qualified for the adult dental clinic.  Chronic Pain Problems Contact [facility]  [phone] Patients need to be referred by their primary care doctor.  Insufficient Money for Medicine Contact [employer] Way:  call "211" or [facility] (630) 028-9983.  No Primary Care Doctor Call [facility]  [phone] Other agencies that provide inexpensive medical care    [facility]  [phone]    [facility]  [phone]    [facility]  [phone]    [facility]  [phone]    [facility]  [phone]    [facility]  [phone]  Psychological Services [facility]  [phone] [facility] Services  [phone] [facility]   [phone] (emergency services [phone])  Substance Abuse Resources [facility]  [phone] [facility] [phone] [facility] [phone] Floydene Flock [phone] Residential & Outpatient Substance Abuse Program  ([phone]  Abuse/Neglect [facility] Child Abuse Hotline  769-154-0131 [facility] Child Abuse Hotline (218) 094-3966 (After Hours)  Emergency Shelter [facility] (947) [phone]  Maternity Homes Room at the [place] [facility] [place] [phone] [place] Alert Services [phone]  MRSA Hotline #:   450-421-7854    [facility] Resources  [facility]     United [employer]                          [facility] Dept. [address] [place]                       [address]      [address]  [place]                                                Cristobal Goldmann Phone:  [phone]                                   Phone:  [phone]  Phone:  [phone]  [facility] Phone:  [phone]  [facility] Child Abuse Hotline 9781968662 [phone] (After Hours)

## 2011-12-05 NOTE — ED Provider Notes (Addendum)
Pt w etoh abuse, ?SI. Alert, content. No new c/o. Vitals stable. telepsych consult ordered.   Suzi Roots, MD 12/05/11 [phone]   Act team indicates now sober, no thoughts of harm to self or others. rec d/c.  telepsych eval completed. Psychiatrist indicated pt w clear, not delusional thought process. Is now sober. Denies any intent or wish to harm himself or others. Psychiatrist clears for d/c home. Pt states he will follow up through via and their etoh rehab program. No tremor or shakes, vitals stable. Pt agreeable w d/c plan.   Suzi Roots, MD 12/05/11 [phone]

## 2011-12-05 NOTE — Discharge Planning (Signed)
CSW spoke with patient who reported no longer being suicidal or homicidal. Patient expressed desire to enter [place] program at the [facility]. CSW informed patient that he could not enter that program from the Emergency Department and we could provide him with that information. Patient expressed appreciation.  Patient denied SI/HI/AVH. CSW requested telepsych for recommendations.  Ileene Hutchinson , MSW, LCSWA 12/05/2011 12:35 PM [phone]

## 2011-12-05 NOTE — ED Provider Notes (Signed)
Medical screening examination/treatment/procedure(s) were conducted as a shared visit with non-physician practitioner(s) and myself.  I personally evaluated the patient during the encounter  Doug Sou, MD 12/05/11 [phone]

## 2012-03-13 IMAGING — CT CT HEAD W/O CM
1 series · 16 of 30 positions shown, 20 images · non-contrast
Comparison: None

CLINICAL DATA: 50-year-old male - altered mental status.

CT HEAD WITHOUT CONTRAST
TECHNIQUE: Contiguous axial images were obtained from the base of
the skull through the vertex without contrast.

[Series 2: headtrauma 4.8 h37s · axial · 0.49mm/px · z∈[+50,+207]mm · 16 of 36 slices shown, 20 images]
[im 2/36  brain]
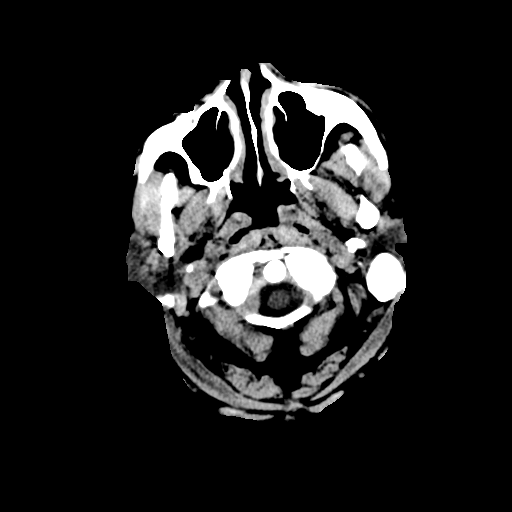
[im 2/36  bone]
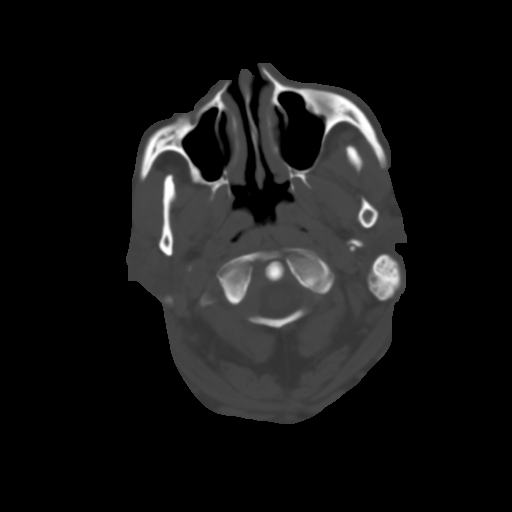
[im 4/36  brain]
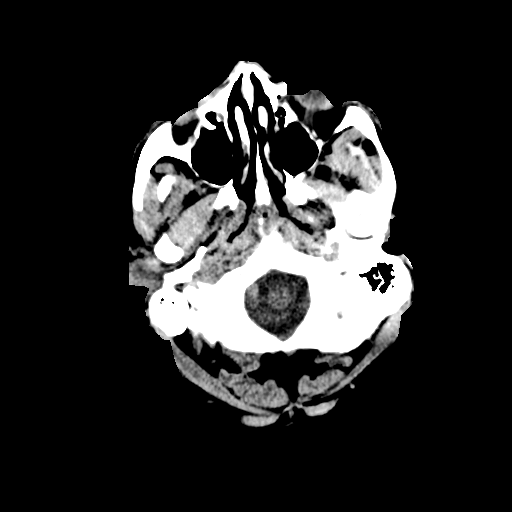
[im 7/36  brain]
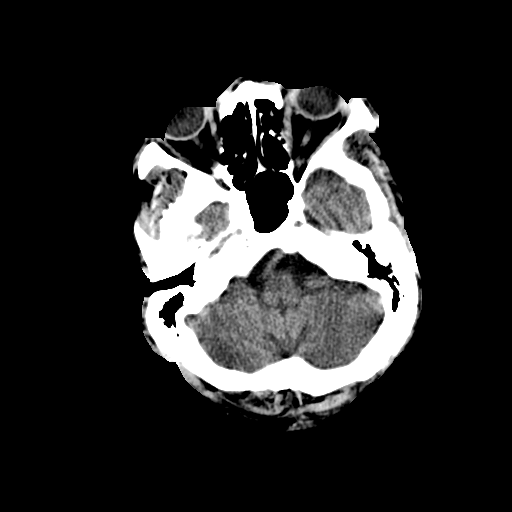
[im 9/36  brain]
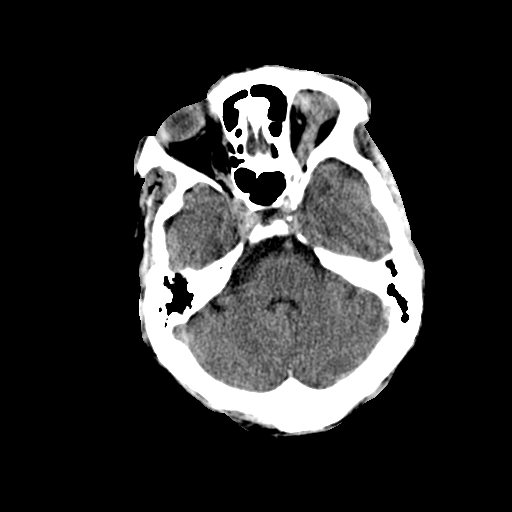
[im 10/36  brain]
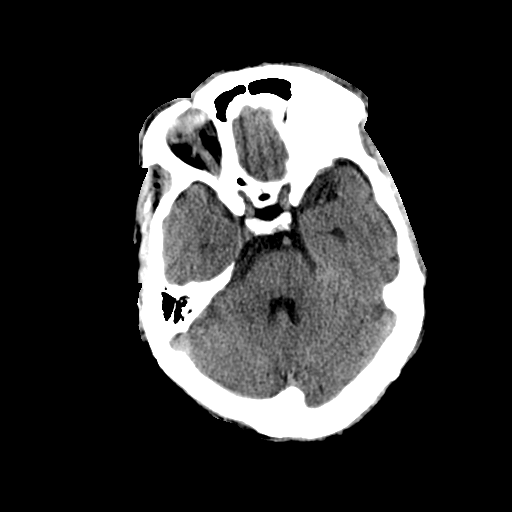
[im 10/36  bone]
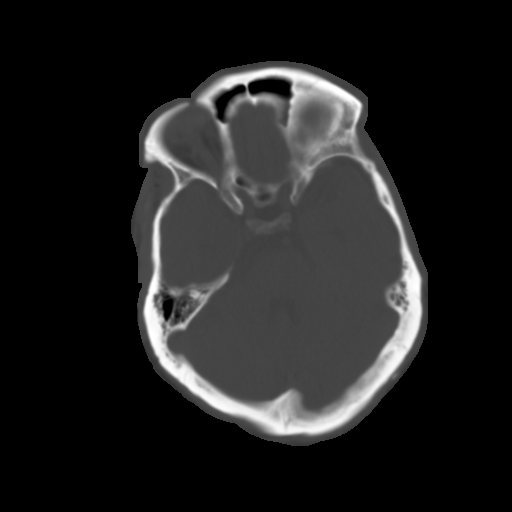
[im 13/36  brain]
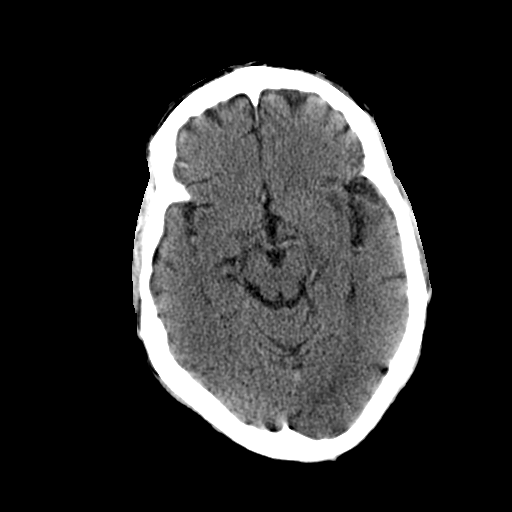
[im 15/36  brain]
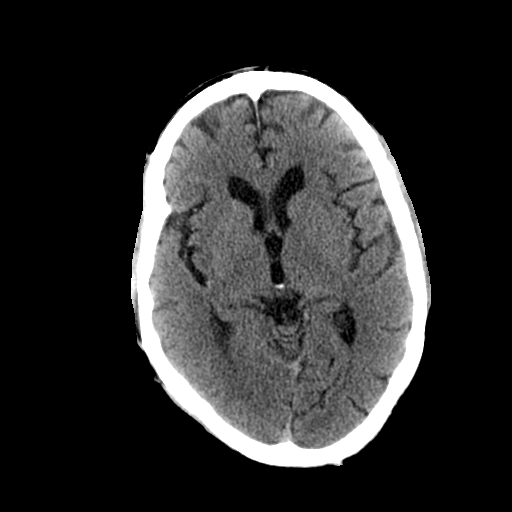
[im 17/36  brain]
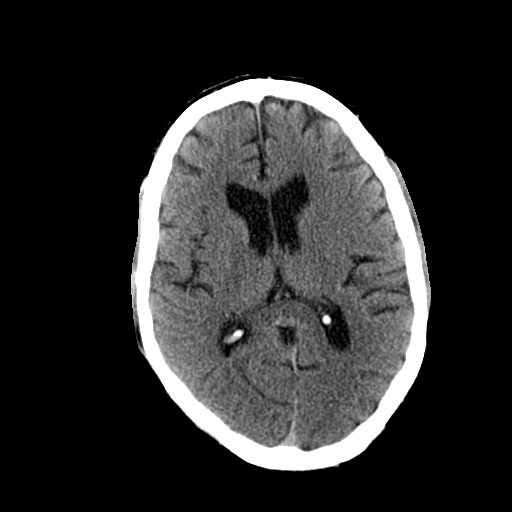
[im 19/36  brain]
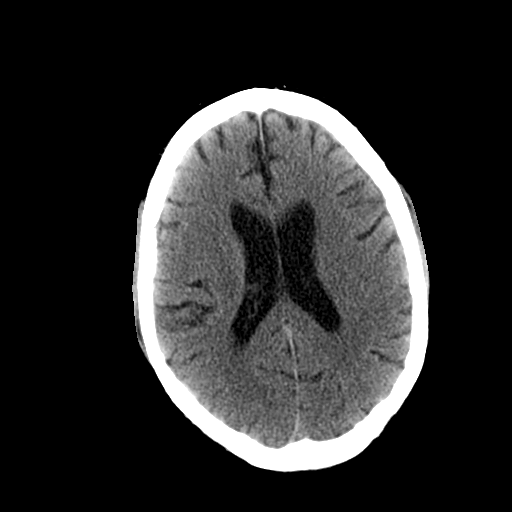
[im 19/36  bone]
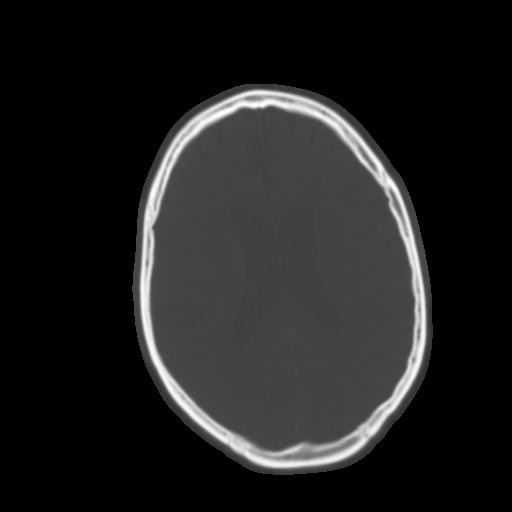
[im 21/36  brain]
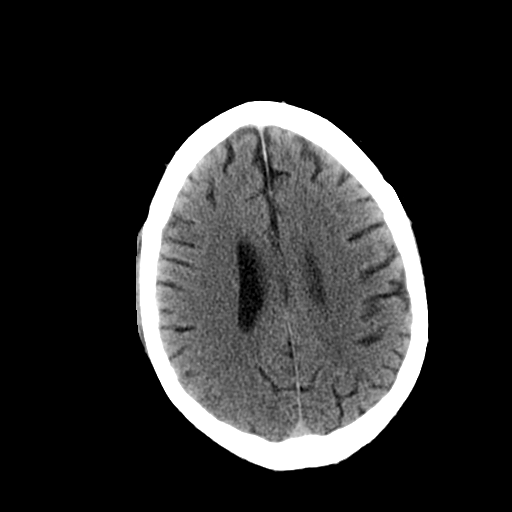
[im 23/36  brain]
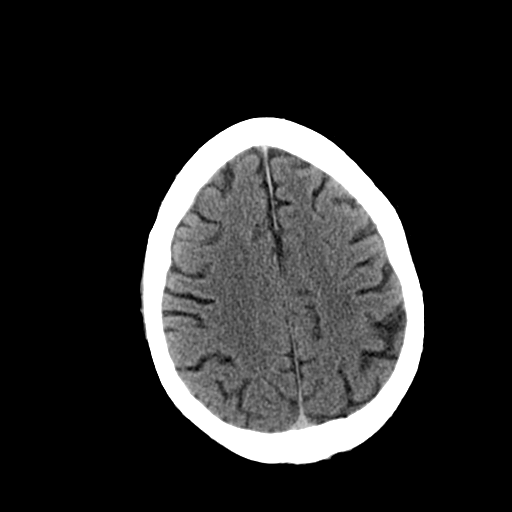
[im 26/36  brain]
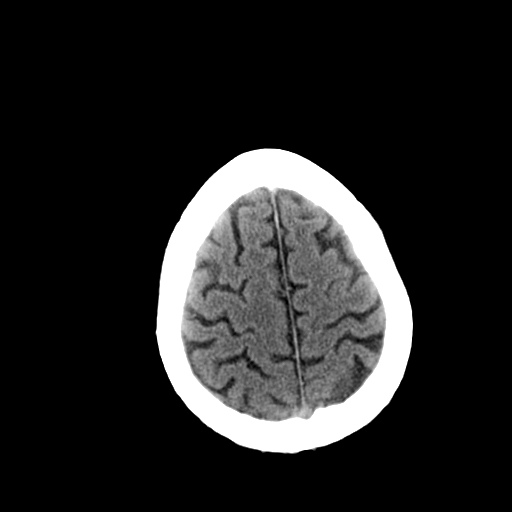
[im 27/36  brain]
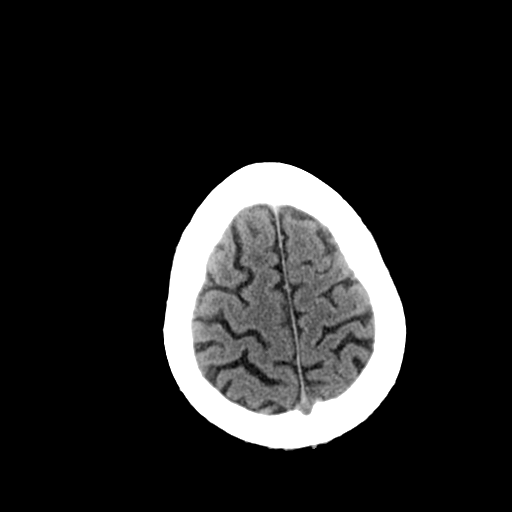
[im 27/36  bone]
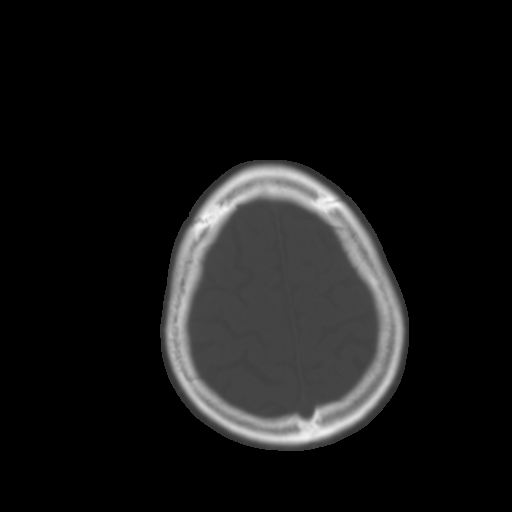
[im 29/36  brain]
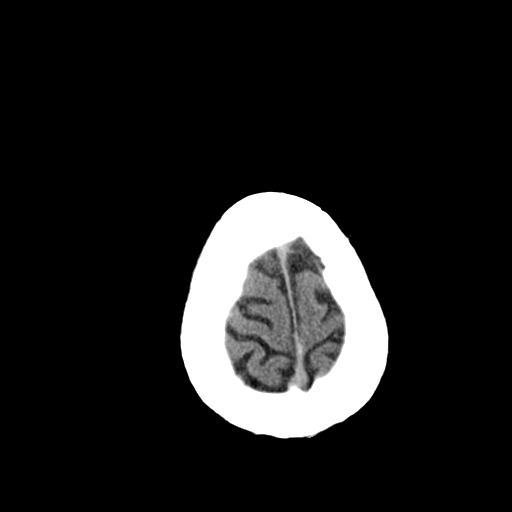
[im 32/36  brain]
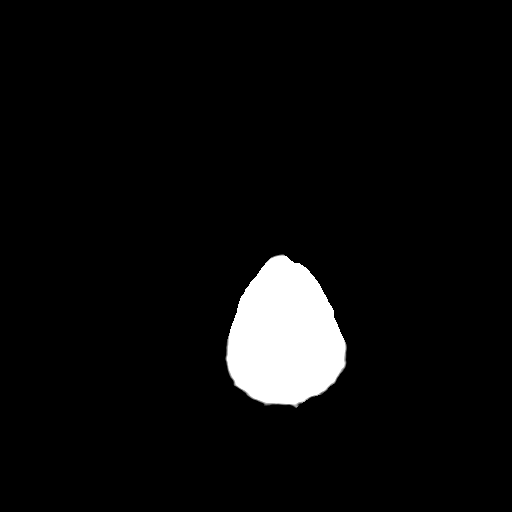
[im 34/36  brain]
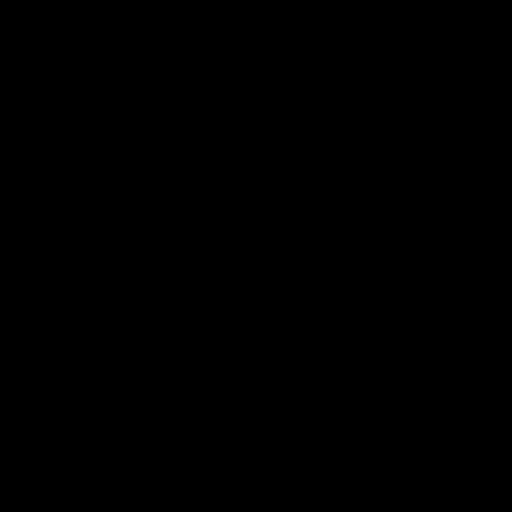

[16 of 30 positions shown; findings below may reference images not displayed]

FINDINGS: No acute intracranial abnormalities are identified,
including mass lesion or mass effect, hydrocephalus, extra-axial
fluid collection, midline shift, hemorrhage, or acute infarction.

The visualized bony calvarium is unremarkable.
IMPRESSION: No evidence of intracranial abnormality.

## 2012-09-26 ENCOUNTER — Encounter (HOSPITAL_COMMUNITY): Payer: Self-pay | Admitting: *Deleted

## 2012-09-26 ENCOUNTER — Emergency Department (HOSPITAL_COMMUNITY)
Admission: EM | Admit: 2012-09-26 | Discharge: 2012-09-26 | Disposition: A | Discharge status: Home / Self Care | Payer: Non-veteran care | Attending: Emergency Medicine | Admitting: Emergency Medicine

## 2012-09-26 DIAGNOSIS — R21 Rash and other nonspecific skin eruption: Secondary | ICD-10-CM | POA: Insufficient documentation

## 2012-09-26 DIAGNOSIS — I10 Essential (primary) hypertension: Secondary | ICD-10-CM | POA: Insufficient documentation

## 2012-09-26 DIAGNOSIS — F3289 Other specified depressive episodes: Secondary | ICD-10-CM | POA: Insufficient documentation

## 2012-09-26 DIAGNOSIS — F17200 Nicotine dependence, unspecified, uncomplicated: Secondary | ICD-10-CM | POA: Insufficient documentation

## 2012-09-26 DIAGNOSIS — Z79899 Other long term (current) drug therapy: Secondary | ICD-10-CM | POA: Insufficient documentation

## 2012-09-26 DIAGNOSIS — L299 Pruritus, unspecified: Secondary | ICD-10-CM | POA: Insufficient documentation

## 2012-09-26 DIAGNOSIS — F329 Major depressive disorder, single episode, unspecified: Secondary | ICD-10-CM | POA: Insufficient documentation

## 2012-09-26 DIAGNOSIS — Z8659 Personal history of other mental and behavioral disorders: Secondary | ICD-10-CM | POA: Insufficient documentation

## 2012-09-26 DIAGNOSIS — T7840XA Allergy, unspecified, initial encounter: Secondary | ICD-10-CM

## 2012-09-26 DIAGNOSIS — J4489 Other specified chronic obstructive pulmonary disease: Secondary | ICD-10-CM | POA: Insufficient documentation

## 2012-09-26 DIAGNOSIS — J449 Chronic obstructive pulmonary disease, unspecified: Secondary | ICD-10-CM | POA: Insufficient documentation

## 2012-09-26 MED ORDER — SODIUM CHLORIDE 0.9 % IV BOLUS (SEPSIS): 1000.0000 mL | Freq: Once | INTRAVENOUS | Status: DC

## 2012-09-26 MED ORDER — METHYLPREDNISOLONE SODIUM SUCC 125 MG IJ SOLR
125.0000 mg | Freq: Once | INTRAMUSCULAR | Status: AC
  Administered 2012-09-26: 125 mg via INTRAVENOUS
  Filled 2012-09-26: qty 2

## 2012-09-26 MED ORDER — FAMOTIDINE IN NACL 20-0.9 MG/50ML-% IV SOLN
20.0000 mg | Freq: Once | INTRAVENOUS | Status: AC
  Administered 2012-09-26: 20 mg via INTRAVENOUS
  Filled 2012-09-26: qty 50

## 2012-09-26 MED ORDER — DIPHENHYDRAMINE HCL 50 MG/ML IJ SOLN
25.0000 mg | Freq: Once | INTRAMUSCULAR | Status: AC
  Administered 2012-09-26: 25 mg via INTRAVENOUS
  Filled 2012-09-26: qty 1

## 2012-09-26 NOTE — ED Provider Notes (Signed)
History   This chart was scribed for Donnetta Hutching, MD, by Frederik Pear, ER scribe. The patient was seen in room APA19/APA19 and the patient's care was started at 1837.    CSN: [id number]  Arrival date & time 09/26/12  7829   First MD Initiated Contact with Patient 09/26/12 1837      Chief Complaint  Patient presents with  . Allergic Reaction    (Consider location/radiation/quality/duration/timing/severity/associated sxs/prior treatment) HPI Jason Rojas is a 51 y.o. male who presents to the Emergency Department complaining of an allergic reaction with associated itching and rash to an unknown substance. He denies any SOB. He denies any h/o of similar symptoms. He reports that he took Benadryl at home with no relief.   Past Medical History  Diagnosis Date  . COPD (chronic obstructive pulmonary disease)   . Hypertension   . PTSD (post-traumatic stress disorder)   . Depression     Past Surgical History  Procedure Date  . Appendectomy   . Tonsillectomy     No family history on file.  History  Substance Use Topics  . Smoking status: Current Every Day Smoker -- 1.5 packs/day  . Smokeless tobacco: Not on file  . Alcohol Use: Yes     Comment: 4 40 oz beers per day.       Review of Systems  Skin: Positive for rash.  All other systems reviewed and are negative.    Allergies  Review of patient's allergies indicates no known allergies.  Home Medications   Current Outpatient Rx  Name  Route  Sig  Dispense  Refill  . CITALOPRAM HYDROBROMIDE 40 MG PO TABS   Oral   Take 40 mg by mouth daily.         Marland Kitchen HYDROCHLOROTHIAZIDE 25 MG PO TABS   Oral   Take 25 mg by mouth daily.         Marland Kitchen HYDROCODONE-ACETAMINOPHEN 5-500 MG PO TABS   Oral   Take 1 tablet by mouth every 6 (six) hours as needed.         Marland Kitchen OLANZAPINE 20 MG PO TABS   Oral   Take 20 mg by mouth at bedtime.         Marland Kitchen PRAZOSIN HCL 1 MG PO CAPS   Oral   Take 1 mg by mouth at bedtime.         .  THIAMINE HCL 100 MG PO TABS   Oral   Take 100 mg by mouth daily.         . TRAZODONE HCL 100 MG PO TABS   Oral   Take 100 mg by mouth at bedtime.           BP 136/90  Pulse 112  Temp 98 F (36.7 C) (Oral)  Resp 16  Ht 5\' 7"  (1.702 m)  Wt 177 lb (80.287 kg)  BMI 27.72 kg/m2  SpO2 100%  Physical Exam  Nursing note and vitals reviewed. Constitutional: He is oriented to person, place, and time. He appears well-developed and well-nourished.  HENT:  Head: Normocephalic and atraumatic.  Eyes: Conjunctivae normal and EOM are normal. Pupils are equal, round, and reactive to light.  Neck: Normal range of motion. Neck supple.  Cardiovascular: Normal rate, regular rhythm and normal heart sounds.   Pulmonary/Chest: Effort normal and breath sounds normal.       He has normal respirations.   Abdominal: Soft. Bowel sounds are normal.  Musculoskeletal: Normal range of motion.  Neurological:  He is alert and oriented to person, place, and time.  Skin: Skin is warm and dry. There is erythema.       He is very erythemas. He has diffuse urticarial wheals.  Psychiatric: He has a normal mood and affect.    ED Course  Procedures (including critical care time)  DIAGNOSTIC STUDIES: Oxygen Saturation is 100% on room air, normal by my interpretation.    COORDINATION OF CARE:  18:55- Discussed planned course of treatment with the patient, who is agreeable at this time.  19:00- Medication Orders- sodium chloride 0.9% bolus 1,000 mL-Once, methylPrednisolone sodium usccinate (SOLU-MEDROL) 125 mg/ 2 mL injection, famotidine (Pepcid) IVPB 20 mg- Once, diphenhydramine (Benadryl) injection 25 mg- Once.  Labs Reviewed - No data to display No results found.   No diagnosis found.    MDM  Allergic reaction to unknown allergen. Patient feels much better after IV  SoluMedrol, Benadryl, Pepcid.  I personally performed the services described in this documentation, which was scribed in my  presence. The recorded information has been reviewed and is accurate.        Donnetta Hutching, MD 09/26/12 2059

## 2012-09-26 NOTE — ED Notes (Signed)
Rash and itching began one hour PTA> NAD

## 2012-12-25 ENCOUNTER — Emergency Department (HOSPITAL_COMMUNITY)
Admission: EM | Admit: 2012-12-25 | Discharge: 2012-12-26 | Disposition: A | Discharge status: Home / Self Care | Payer: Non-veteran care | Attending: Emergency Medicine | Admitting: Emergency Medicine

## 2012-12-25 ENCOUNTER — Encounter (HOSPITAL_COMMUNITY): Payer: Self-pay | Admitting: Emergency Medicine

## 2012-12-25 DIAGNOSIS — F1910 Other psychoactive substance abuse, uncomplicated: Secondary | ICD-10-CM | POA: Diagnosis not present

## 2012-12-25 DIAGNOSIS — J449 Chronic obstructive pulmonary disease, unspecified: Secondary | ICD-10-CM | POA: Diagnosis not present

## 2012-12-25 DIAGNOSIS — F17200 Nicotine dependence, unspecified, uncomplicated: Secondary | ICD-10-CM | POA: Insufficient documentation

## 2012-12-25 DIAGNOSIS — F329 Major depressive disorder, single episode, unspecified: Secondary | ICD-10-CM | POA: Diagnosis not present

## 2012-12-25 DIAGNOSIS — F1010 Alcohol abuse, uncomplicated: Secondary | ICD-10-CM | POA: Insufficient documentation

## 2012-12-25 DIAGNOSIS — Z79899 Other long term (current) drug therapy: Secondary | ICD-10-CM | POA: Insufficient documentation

## 2012-12-25 DIAGNOSIS — I10 Essential (primary) hypertension: Secondary | ICD-10-CM | POA: Insufficient documentation

## 2012-12-25 DIAGNOSIS — F4310 Post-traumatic stress disorder, unspecified: Secondary | ICD-10-CM | POA: Diagnosis not present

## 2012-12-25 DIAGNOSIS — Z7289 Other problems related to lifestyle: Secondary | ICD-10-CM

## 2012-12-25 DIAGNOSIS — F3289 Other specified depressive episodes: Secondary | ICD-10-CM | POA: Insufficient documentation

## 2012-12-25 DIAGNOSIS — Z046 Encounter for general psychiatric examination, requested by authority: Secondary | ICD-10-CM | POA: Diagnosis present

## 2012-12-25 DIAGNOSIS — J4489 Other specified chronic obstructive pulmonary disease: Secondary | ICD-10-CM | POA: Insufficient documentation

## 2012-12-25 LAB — CBC WITH DIFFERENTIAL/PLATELET
Basophils Relative: 0 % (ref 0–1)
Hemoglobin: 15.6 g/dL (ref 13.0–17.0)
Lymphocytes Relative: 31 % (ref 12–46)
Lymphs Abs: 3.3 10*3/uL (ref 0.7–4.0)
MCHC: 34.4 g/dL (ref 30.0–36.0)
Monocytes Relative: 5 % (ref 3–12)
Neutro Abs: 6.7 10*3/uL (ref 1.7–7.7)
Neutrophils Relative %: 63 % (ref 43–77)
RBC: 5.27 MIL/uL (ref 4.22–5.81)
WBC: 10.7 10*3/uL — ABNORMAL HIGH (ref 4.0–10.5)

## 2012-12-25 NOTE — ED Notes (Signed)
Patient here with [facility] department on [employer]. Mother took papers out on patient. Mother reports patient is dangerous to himself and others and reports he uses drugs and alcohol.

## 2012-12-25 NOTE — ED Provider Notes (Signed)
History     CSN: [id number]  Arrival date & time 12/25/12  2253   First MD Initiated Contact with Patient 12/25/12 2317      Chief Complaint  Patient presents with  . V70.1    (Consider location/radiation/quality/duration/timing/severity/associated sxs/prior treatment) HPI Jason Rojas is a 52 y.o. male who presents to the Emergency Department  Brought in on IVC paperwork in the company of [facility] Department with c/o use of cocaine and etoh and danger to self and others. Patient is not suicidal, homicidal and not psychotic.     Past Medical History  Diagnosis Date  . COPD (chronic obstructive pulmonary disease)   . Hypertension   . PTSD (post-traumatic stress disorder)   . Depression     Past Surgical History  Procedure Laterality Date  . Appendectomy    . Tonsillectomy      History reviewed. No pertinent family history.  History  Substance Use Topics  . Smoking status: Current Every Day Smoker -- 1.50 packs/day  . Smokeless tobacco: Not on file  . Alcohol Use: Yes     Comment: 4 40 oz beers per day.       Review of Systems  Constitutional: Negative for fever.       10 Systems reviewed and are negative for acute change except as noted in the HPI.  HENT: Negative for congestion.   Eyes: Negative for discharge and redness.  Respiratory: Negative for cough and shortness of breath.   Cardiovascular: Negative for chest pain.  Gastrointestinal: Negative for vomiting and abdominal pain.  Musculoskeletal: Negative for back pain.  Skin: Negative for rash.  Neurological: Negative for syncope, numbness and headaches.  Psychiatric/Behavioral:       No behavior change.    Allergies  Review of patient's allergies indicates no known allergies.  Home Medications   Current Outpatient Rx  Name  Route  Sig  Dispense  Refill  . citalopram (CELEXA) 40 MG tablet   Oral   Take 40 mg by mouth daily.         . hydrochlorothiazide (HYDRODIURIL) 25 MG tablet   Oral  Take 25 mg by mouth every morning.          Marland Kitchen OLANZapine (ZYPREXA) 20 MG tablet   Oral   Take 20 mg by mouth at bedtime.         . prazosin (MINIPRESS) 1 MG capsule   Oral   Take 1 mg by mouth at bedtime.           BP 146/97  Pulse 92  Temp(Src) 98.1 F (36.7 C) (Oral)  Resp 20  SpO2 100%  Physical Exam  Nursing note and vitals reviewed. Constitutional: He appears well-developed and well-nourished.  Awake, alert, nontoxic appearance.  HENT:  Head: Normocephalic and atraumatic.  Mouth/Throat: Oropharynx is clear and moist.  Eyes: EOM are normal. Pupils are equal, round, and reactive to light. Right eye exhibits no discharge. Left eye exhibits no discharge.  Neck: Normal range of motion. Neck supple.  Cardiovascular: Normal rate and intact distal pulses.   Pulmonary/Chest: Effort normal and breath sounds normal. He exhibits no tenderness.  Abdominal: Soft. Bowel sounds are normal. There is no tenderness. There is no rebound.  Musculoskeletal: He exhibits no tenderness.  Baseline ROM, no obvious new focal weakness.  Neurological:  Mental status and motor strength appears baseline for patient and situation.  Skin: No rash noted.  Psychiatric:  Patient is not suicidal, homicidal or psychotic. He does not  meet criteria for placement. He is not interested in help for his alcohol and cocaine use.    ED Course  Procedures (including critical care time) Results for orders placed during the hospital encounter of 12/25/12  ETHANOL      Result Value Range   Alcohol, Ethyl (B) 322 (*) 0 - 11 mg/dL  CBC WITH DIFFERENTIAL      Result Value Range   WBC 10.7 (*) 4.0 - 10.5 K/uL   RBC 5.27  4.22 - 5.81 MIL/uL   Hemoglobin 15.6  13.0 - 17.0 g/dL   HCT 56.2  13.0 - 86.5 %   MCV 86.0  78.0 - 100.0 fL   MCH 29.6  26.0 - 34.0 pg   MCHC 34.4  30.0 - 36.0 g/dL   RDW 78.4  69.6 - 29.5 %   Platelets 239  150 - 400 K/uL   Neutrophils Relative 63  43 - 77 %   Neutro Abs 6.7  1.7 -  7.7 K/uL   Lymphocytes Relative 31  12 - 46 %   Lymphs Abs 3.3  0.7 - 4.0 K/uL   Monocytes Relative 5  3 - 12 %   Monocytes Absolute 0.5  0.1 - 1.0 K/uL   Eosinophils Relative 1  0 - 5 %   Eosinophils Absolute 0.1  0.0 - 0.7 K/uL   Basophils Relative 0  0 - 1 %   Basophils Absolute 0.0  0.0 - 0.1 K/uL  BASIC METABOLIC PANEL      Result Value Range   Sodium 138  135 - 145 mEq/L   Potassium 3.9  3.5 - 5.1 mEq/L   Chloride 100  96 - 112 mEq/L   CO2 26  19 - 32 mEq/L   Glucose, Bld 99  70 - 99 mg/dL   BUN 9  6 - 23 mg/dL   Creatinine, Ser 2.84  0.50 - 1.35 mg/dL   Calcium 9.6  8.4 - 13.2 mg/dL   GFR calc non Af Amer >90  >90 mL/min   GFR calc Af Amer >90  >90 mL/min  URINE RAPID DRUG SCREEN (HOSP PERFORMED)      Result Value Range   Opiates NONE DETECTED  NONE DETECTED   Cocaine POSITIVE (*) NONE DETECTED   Benzodiazepines NONE DETECTED  NONE DETECTED   Amphetamines NONE DETECTED  NONE DETECTED   Tetrahydrocannabinol NONE DETECTED  NONE DETECTED   Barbiturates NONE DETECTED  NONE DETECTED       1241 Patient is not interested in help for his alcohol and cocaine use. Rescinded paperwork.  MDM  Patient brought in on IVC paperwork for cocaine and ETOH abuse. While patient does use cocaine and drink, he is not suicidal, homicidal or psychotic. He does not meet criteria for admission. He will be returned home by [facility]. Pt stable in ED with no significant deterioration in condition.The patient appears reasonably screened and/or stabilized for discharge and I doubt any other medical condition or other [facility] requiring further screening, evaluation, or treatment in the ED at this time prior to discharge. MDM Reviewed: nursing note and vitals Interpretation: labs           Nicoletta Dress. Colon Branch, MD 12/26/12 [phone]

## 2012-12-26 LAB — BASIC METABOLIC PANEL
BUN: 9 mg/dL (ref 6–23)
Chloride: 100 mEq/L (ref 96–112)
GFR calc Af Amer: >90 mL/min (ref >90)
Potassium: 3.9 mEq/L (ref 3.5–5.1)

## 2012-12-26 LAB — ETHANOL: Alcohol, Ethyl (B): 322 mg/dL — ABNORMAL HIGH (ref 0–11)

## 2012-12-26 LAB — RAPID URINE DRUG SCREEN, HOSP PERFORMED
Amphetamines: NOT DETECTED
Benzodiazepines: NOT DETECTED

## 2012-12-26 MED ORDER — NICOTINE 14 MG/24HR TD PT24
14.0000 mg | MEDICATED_PATCH | Freq: Once | TRANSDERMAL | Status: DC
  Administered 2012-12-26: 14 mg via TRANSDERMAL
  Filled 2012-12-26: qty 1

## 2012-12-26 NOTE — ED Notes (Signed)
Paper resent as requested by Dr. Colon Branch. [employer] called to give patient ride home.

## 2012-12-26 NOTE — ED Notes (Signed)
Patient reports he killed [facility], describing how he killed him.

## 2012-12-26 NOTE — ED Notes (Signed)
Meal tray given to patient at patient request and MD approval.

## 2012-12-26 NOTE — ED Notes (Signed)
Patient discharged home.

## 2013-06-22 ENCOUNTER — Encounter (HOSPITAL_COMMUNITY): Payer: Self-pay | Admitting: *Deleted

## 2013-06-22 ENCOUNTER — Emergency Department (HOSPITAL_COMMUNITY)
Admission: EM | Admit: 2013-06-22 | Discharge: 2013-06-22 | Disposition: A | Discharge status: Home / Self Care | Payer: Non-veteran care | Attending: Emergency Medicine | Admitting: Emergency Medicine

## 2013-06-22 DIAGNOSIS — R040 Epistaxis: Secondary | ICD-10-CM | POA: Insufficient documentation

## 2013-06-22 DIAGNOSIS — Z79899 Other long term (current) drug therapy: Secondary | ICD-10-CM | POA: Insufficient documentation

## 2013-06-22 DIAGNOSIS — I10 Essential (primary) hypertension: Secondary | ICD-10-CM | POA: Insufficient documentation

## 2013-06-22 DIAGNOSIS — F17200 Nicotine dependence, unspecified, uncomplicated: Secondary | ICD-10-CM | POA: Insufficient documentation

## 2013-06-22 DIAGNOSIS — F4310 Post-traumatic stress disorder, unspecified: Secondary | ICD-10-CM | POA: Insufficient documentation

## 2013-06-22 DIAGNOSIS — J449 Chronic obstructive pulmonary disease, unspecified: Secondary | ICD-10-CM | POA: Insufficient documentation

## 2013-06-22 DIAGNOSIS — J4489 Other specified chronic obstructive pulmonary disease: Secondary | ICD-10-CM | POA: Insufficient documentation

## 2013-06-22 DIAGNOSIS — F329 Major depressive disorder, single episode, unspecified: Secondary | ICD-10-CM | POA: Insufficient documentation

## 2013-06-22 DIAGNOSIS — F3289 Other specified depressive episodes: Secondary | ICD-10-CM | POA: Insufficient documentation

## 2013-06-22 MED ORDER — OXYMETAZOLINE HCL 0.05 % NA SOLN
NASAL | Status: AC
  Filled 2013-06-22: qty 15

## 2013-06-22 MED ORDER — OXYMETAZOLINE HCL 0.05 % NA SOLN
1.0000 | Freq: Once | NASAL | Status: AC
  Administered 2013-06-22: 1 via NASAL

## 2013-06-22 NOTE — ED Notes (Signed)
Nosebleed earlier, no bleeding noted at this time, pt admits to drinking 5-6 beers today

## 2013-06-22 NOTE — ED Notes (Signed)
Nose bleed ,no bleeding at present.  No injury

## 2013-06-24 NOTE — ED Provider Notes (Signed)
CSN: [id number]     Arrival date & time 06/22/13  1937 History   First MD Initiated Contact with Patient 06/22/13 2046     Chief Complaint  Patient presents with  . Epistaxis   (Consider location/radiation/quality/duration/timing/severity/associated sxs/prior Treatment) HPI Comments: Jason Rojas is a 52 y.o. male who presents to the Emergency Department complaining of sudden onset of nose bleed.  States that his nose was bleeding from both nostrils just prior to ED arrival and episode was brief but he was concerned because it was bleeding from both sides of his nose.  States that he has frequent episodes of nose bleeds.  He denies facial trauma, headaches, dizziness, vomiting or bleeding disorder although he does admit to drinking alcohol earlier today.  States the bleeding resolved spotaneoulsy  Patient is a 52 y.o. male presenting with nosebleeds. The history is provided by the patient.  Epistaxis Location:  Bilateral Severity:  Mild Timing: episodic. Progression:  Resolved Chronicity:  Recurrent Context: hypertension   Context: not anticoagulants, not aspirin use, not bleeding disorder, not drug use, not foreign body, not nose picking, not thrombocytopenia and not trauma   Context comment:  Daily alcohol use Relieved by:  Nothing Worsened by:  Nothing tried Ineffective treatments:  None tried Associated symptoms: congestion   Associated symptoms: no blood in oropharynx, no cough, no dizziness, no facial pain, no fever, no headaches, no sinus pain, no sneezing, no sore throat and no syncope   Risk factors: alcohol use and frequent nosebleeds   Risk factors: no change in medication and no recent nasal surgery     Past Medical History  Diagnosis Date  . COPD (chronic obstructive pulmonary disease)   . Hypertension   . PTSD (post-traumatic stress disorder)   . Depression    Past Surgical History  Procedure Laterality Date  . Appendectomy    . Tonsillectomy     History  reviewed. No pertinent family history. History  Substance Use Topics  . Smoking status: Current Every Day Smoker -- 1.50 packs/day  . Smokeless tobacco: Not on file  . Alcohol Use: Yes     Comment: 4 40 oz beers per day.     Review of Systems  Constitutional: Negative for fever, chills, activity change and appetite change.  HENT: Positive for nosebleeds and congestion. Negative for sore throat, facial swelling, rhinorrhea, sneezing, trouble swallowing, neck pain and neck stiffness.   Eyes: Negative for visual disturbance.  Respiratory: Negative for cough, chest tightness, shortness of breath, wheezing and stridor.   Cardiovascular: Negative for syncope.  Gastrointestinal: Negative for nausea, vomiting and abdominal pain.  Skin: Negative.  Negative for rash.  Neurological: Negative for dizziness, weakness, numbness and headaches.  Hematological: Negative for adenopathy. Does not bruise/bleed easily.  Psychiatric/Behavioral: Negative for confusion.  All other systems reviewed and are negative.    Allergies  Review of patient's allergies indicates no known allergies.  Home Medications   Current Outpatient Rx  Name  Route  Sig  Dispense  Refill  . citalopram (CELEXA) 40 MG tablet   Oral   Take 40 mg by mouth daily.         . hydrochlorothiazide (HYDRODIURIL) 25 MG tablet   Oral   Take 25 mg by mouth every morning.          Marland Kitchen OLANZapine (ZYPREXA) 20 MG tablet   Oral   Take 20 mg by mouth at bedtime.         . prazosin (MINIPRESS) 1 MG  capsule   Oral   Take 1 mg by mouth at bedtime.          BP 124/82  Pulse 83  Temp(Src) 98.1 F (36.7 C) (Oral)  Resp 20  Ht 5\' 7"  (1.702 m)  Wt 170 lb (77.111 kg)  BMI 26.62 kg/m2  SpO2 97% Physical Exam  Nursing note and vitals reviewed. Constitutional: He is oriented to person, place, and time. He appears well-developed and well-nourished. No distress.  Patient is alert, but smells of ETOH  HENT:  Head: Normocephalic  and atraumatic.  Nose: Mucosal edema present. No sinus tenderness, septal deviation or nasal septal hematoma.  No foreign bodies.  Mouth/Throat: Uvula is midline, oropharynx is clear and moist and mucous membranes are normal.  Dried blood to bilateral nares.  Mild edema.  No active epistaxis.    Neck: Normal range of motion. Neck supple.  Cardiovascular: Normal rate, regular rhythm, normal heart sounds and intact distal pulses.   No murmur heard. Pulmonary/Chest: Effort normal and breath sounds normal. No respiratory distress.  Musculoskeletal: Normal range of motion.  Lymphadenopathy:    He has no cervical adenopathy.  Neurological: He is alert and oriented to person, place, and time. He exhibits normal muscle tone. Coordination normal.  Skin: Skin is warm and dry.    ED Course  Procedures (including critical care time) Labs Review Labs Reviewed - No data to display Imaging Review No results found.  MDM   1. Epistaxis    Patient is alert, well appearing. Ambulates with a steady gait.  No active epistaxis, BP is wnml.  Advised to stop drinking, avoid blowing his nose and to use saline nasal spray.  Patient agrees to return here if the sx's worsen      Jason Fosco L. Reginna Sermeno, PA-C 06/24/13 0122

## 2013-06-25 NOTE — ED Provider Notes (Signed)
Medical screening examination/treatment/procedure(s) were performed by non-physician practitioner and as supervising physician I was immediately available for consultation/collaboration.  Myliah Medel R. Rhett Mutschler, MD 06/25/13 1613 

## 2013-09-22 ENCOUNTER — Emergency Department (HOSPITAL_COMMUNITY)
Admission: EM | Admit: 2013-09-22 | Discharge: 2013-09-22 | Disposition: A | Discharge status: Home / Self Care | Payer: Non-veteran care | Attending: Emergency Medicine | Admitting: Emergency Medicine

## 2013-09-22 ENCOUNTER — Encounter (HOSPITAL_COMMUNITY): Payer: Self-pay | Admitting: Emergency Medicine

## 2013-09-22 DIAGNOSIS — I10 Essential (primary) hypertension: Secondary | ICD-10-CM | POA: Insufficient documentation

## 2013-09-22 DIAGNOSIS — J4489 Other specified chronic obstructive pulmonary disease: Secondary | ICD-10-CM | POA: Insufficient documentation

## 2013-09-22 DIAGNOSIS — Z8659 Personal history of other mental and behavioral disorders: Secondary | ICD-10-CM | POA: Insufficient documentation

## 2013-09-22 DIAGNOSIS — L509 Urticaria, unspecified: Secondary | ICD-10-CM | POA: Insufficient documentation

## 2013-09-22 DIAGNOSIS — J449 Chronic obstructive pulmonary disease, unspecified: Secondary | ICD-10-CM | POA: Insufficient documentation

## 2013-09-22 DIAGNOSIS — F17200 Nicotine dependence, unspecified, uncomplicated: Secondary | ICD-10-CM | POA: Insufficient documentation

## 2013-09-22 MED ORDER — DIPHENHYDRAMINE HCL 50 MG/ML IJ SOLN
50.0000 mg | Freq: Once | INTRAMUSCULAR | Status: AC
  Administered 2013-09-22: 50 mg via INTRAMUSCULAR
  Filled 2013-09-22: qty 1

## 2013-09-22 MED ORDER — DIPHENHYDRAMINE HCL 25 MG PO TABS: 25.0000 mg | ORAL_TABLET | Freq: Four times a day (QID) | ORAL | Status: DC

## 2013-09-22 MED ORDER — PREDNISONE 10 MG PO TABS: ORAL_TABLET | ORAL | Status: DC

## 2013-09-22 MED ORDER — DEXAMETHASONE SODIUM PHOSPHATE 4 MG/ML IJ SOLN: 10.0000 mg | Freq: Once | INTRAMUSCULAR | Status: DC

## 2013-09-22 MED ORDER — DEXAMETHASONE SODIUM PHOSPHATE 4 MG/ML IJ SOLN
12.0000 mg | Freq: Once | INTRAMUSCULAR | Status: AC
  Administered 2013-09-22: 12 mg via INTRAMUSCULAR
  Filled 2013-09-22: qty 3

## 2013-09-22 MED ORDER — FAMOTIDINE 20 MG PO TABS
20.0000 mg | ORAL_TABLET | Freq: Once | ORAL | Status: AC
  Administered 2013-09-22: 20 mg via ORAL
  Filled 2013-09-22: qty 1

## 2013-09-22 NOTE — ED Provider Notes (Signed)
Medical screening examination/treatment/procedure(s) were performed by non-physician practitioner and as supervising physician I was immediately available for consultation/collaboration.  EKG Interpretation   None         Lyanne Co, MD 09/22/13 [phone]

## 2013-09-22 NOTE — ED Notes (Signed)
Says he feels much better.

## 2013-09-22 NOTE — ED Notes (Signed)
Pt reports "itching all over" for the last 2 hours. Unsure of anything that may have caused.  Denies any sob.  Has taken no meds.

## 2013-09-22 NOTE — ED Notes (Signed)
Generalized itching , sudden onset. Says he has had similar sx in past and was treated here.  NO sob. Alert. No unusual foods or  Soaps.

## 2013-09-22 NOTE — ED Provider Notes (Addendum)
CSN: [id number]     Arrival date & time 09/22/13  1731 History   First MD Initiated Contact with Patient 09/22/13 1740     Chief Complaint  Patient presents with  . Pruritis   (Consider location/radiation/quality/duration/timing/severity/associated sxs/prior Treatment) HPI Comments: Jason Rojas is a 52 y.o. Male presenting with pruritis and rash which has been present for the past 2 hours.  He denies shortness of breath, facial, mouth , tongue or throat swelling.  He reports eating pork rinds prior to the rash starting.  He was seen here a year ago for similar symptoms which he states he believes also occurred after eating pork rinds.  He has taken no medicines prior to arrival. He has had no new medicines or other exposures, denies change in detergents, soaps, skin care products.       The history is provided by the patient.    Past Medical History  Diagnosis Date  . COPD (chronic obstructive pulmonary disease)   . Hypertension   . PTSD (post-traumatic stress disorder)   . Depression    Past Surgical History  Procedure Laterality Date  . Appendectomy    . Tonsillectomy     No family history on file. History  Substance Use Topics  . Smoking status: Current Every Day Smoker -- 1.50 packs/day    Types: Cigarettes  . Smokeless tobacco: Not on file  . Alcohol Use: Yes     Comment: 4 40 oz beers per day.     Review of Systems  Allergies  Review of patient's allergies indicates no known allergies.  Home Medications  No home medicines  BP 153/98  Pulse 103  Temp(Src) 98 F (36.7 C) (Oral)  Resp 20  SpO2 96% Physical Exam  Constitutional: He appears well-developed and well-nourished. No distress.  HENT:  Head: Normocephalic.  Neck: Neck supple.  Cardiovascular: Normal rate.   Pulmonary/Chest: Effort normal. He has no wheezes.  Musculoskeletal: Normal range of motion. He exhibits no edema.  Skin: Rash noted. Rash is urticarial.  Faint urticarial like raised rash  forearms, few on neck and anterior torso.  No draining, induration, excoriations more pronounced than the rash.    ED Course  Procedures (including critical care time) Labs Review Labs Reviewed - No data to display Imaging Review No results found.  EKG Interpretation   None       MDM   1. Hives    Pt was given decadron 12 mg injection, benadryl 50 mg injection and pepcid 20 mg po.  He was observed until sx completely resolved.  No sx at time of dispo.  Prescribed benadryl and prednisone 6 day taper.  Advised recheck here for any worsened sx.  Advised he may want to avoid pork rinds (he denies eating this food b/n last episode and today).    Burgess Amor, PA-C 09/22/13 2141  Burgess Amor, PA-C 09/29/13 [phone]

## 2013-10-01 NOTE — ED Provider Notes (Signed)
Medical screening examination/treatment/procedure(s) were performed by non-physician practitioner and as supervising physician I was immediately available for consultation/collaboration.  EKG Interpretation   None         Hoy Morn, MD 10/01/13 [phone]

## 2013-11-05 ENCOUNTER — Emergency Department (HOSPITAL_COMMUNITY)
Admission: EM | Admit: 2013-11-05 | Discharge: 2013-11-05 | Disposition: A | Discharge status: Home / Self Care | Payer: Non-veteran care | Attending: Emergency Medicine | Admitting: Emergency Medicine

## 2013-11-05 ENCOUNTER — Encounter (HOSPITAL_COMMUNITY): Payer: Self-pay | Admitting: Emergency Medicine

## 2013-11-05 DIAGNOSIS — Z79899 Other long term (current) drug therapy: Secondary | ICD-10-CM | POA: Diagnosis not present

## 2013-11-05 DIAGNOSIS — F17200 Nicotine dependence, unspecified, uncomplicated: Secondary | ICD-10-CM | POA: Insufficient documentation

## 2013-11-05 DIAGNOSIS — R040 Epistaxis: Secondary | ICD-10-CM | POA: Insufficient documentation

## 2013-11-05 DIAGNOSIS — IMO0002 Reserved for concepts with insufficient information to code with codable children: Secondary | ICD-10-CM | POA: Diagnosis not present

## 2013-11-05 DIAGNOSIS — I10 Essential (primary) hypertension: Secondary | ICD-10-CM | POA: Diagnosis not present

## 2013-11-05 DIAGNOSIS — J3489 Other specified disorders of nose and nasal sinuses: Secondary | ICD-10-CM | POA: Diagnosis not present

## 2013-11-05 DIAGNOSIS — F4310 Post-traumatic stress disorder, unspecified: Secondary | ICD-10-CM | POA: Diagnosis not present

## 2013-11-05 DIAGNOSIS — J4489 Other specified chronic obstructive pulmonary disease: Secondary | ICD-10-CM | POA: Insufficient documentation

## 2013-11-05 DIAGNOSIS — F3289 Other specified depressive episodes: Secondary | ICD-10-CM | POA: Diagnosis not present

## 2013-11-05 DIAGNOSIS — F329 Major depressive disorder, single episode, unspecified: Secondary | ICD-10-CM | POA: Diagnosis not present

## 2013-11-05 DIAGNOSIS — J449 Chronic obstructive pulmonary disease, unspecified: Secondary | ICD-10-CM | POA: Insufficient documentation

## 2013-11-05 MED ORDER — OXYMETAZOLINE HCL 0.05 % NA SOLN
NASAL | Status: AC
  Filled 2013-11-05: qty 15

## 2013-11-05 NOTE — Discharge Instructions (Signed)
Try not to blow your nose. Take mucinex as discussed. Follow up with your primary care doctor.  Nosebleed Nosebleeds can be caused by many conditions including trauma, infections, polyps, foreign bodies, dry mucous membranes or climate, medications and air conditioning. Most nosebleeds occur in the front of the nose. It is because of this location that most nosebleeds can be controlled by pinching the nostrils gently and continuously. Do this for at least 10 to 20 minutes. The reason for this long continuous pressure is that you must hold it long enough for the blood to clot. If during that 10 to 20 minute time period, pressure is released, the process may have to be started again. The nosebleed may stop by itself, quit with pressure, need concentrated heating (cautery) or stop with pressure from packing. HOME CARE INSTRUCTIONS   If your nose was packed, try to maintain the pack inside until your caregiver removes it. If a gauze pack was used and it starts to fall out, gently replace or cut the end off. Do not cut if a balloon catheter was used to pack the nose. Otherwise, do not remove unless instructed.  Avoid blowing your nose for 12 hours after treatment. This could dislodge the pack or clot and start bleeding again.  If the bleeding starts again, sit up and bending forward, gently pinch the front half of your nose continuously for 20 minutes.  If bleeding was caused by dry mucous membranes, cover the inside of your nose every morning with a petroleum or antibiotic ointment. Use your little fingertip as an applicator. Do this as needed during dry weather. This will keep the mucous membranes moist and allow them to heal.  Maintain humidity in your home by using less air conditioning or using a humidifier.  Do not use aspirin or medications which make bleeding more likely. Your caregiver can give you recommendations on this.  Resume normal activities as able but try to avoid straining, lifting or  bending at the waist for several days.  If the nosebleeds become recurrent and the cause is unknown, your caregiver may suggest laboratory tests. SEEK IMMEDIATE MEDICAL CARE IF:   Bleeding recurs and cannot be controlled.  There is unusual bleeding from or bruising on other parts of the body.  You have a fever.  Nosebleeds continue.  There is any worsening of the condition which originally brought you in.  You become lightheaded, feel faint, become sweaty or vomit blood. MAKE SURE YOU:   Understand these instructions.  Will watch your condition.  Will get help right away if you are not doing well or get worse. Document Released: 06/27/2005 Document Revised: 12/10/2011 Document Reviewed: 08/19/2009 [facility] Patient Information 2014 [place], [place].

## 2013-11-05 NOTE — ED Provider Notes (Signed)
Medical screening examination/treatment/procedure(s) were performed by non-physician practitioner and as supervising physician I was immediately available for consultation/collaboration.  EKG Interpretation   None         Sharyon Cable, MD 11/05/13 [phone]

## 2013-11-05 NOTE — ED Provider Notes (Signed)
CSN: [id number]     Arrival date & time 11/05/13  4854 History   First MD Initiated Contact with Patient 11/05/13 [phone]     Chief Complaint  Patient presents with  . Epistaxis   (Consider location/radiation/quality/duration/timing/severity/associated sxs/prior Treatment) HPI Comments: Patient is a 53 year old male with a past medical history of COPD, hypertension, PTSD and depression who presents to the emergency department complaining of intermittent nose bleed x2 days. Patient states he's had a cold over the past couple days, has been blowing his nose. States after 20-40 minutes he is able to stop the bleeding, however nosebleed returns, especially after he blows his nose. States he can feel it dripping to the back of his throat. Denies headaches, dizziness, lightheadedness, fever, chills or weakness. States he has had similar episodes in the past.  Patient is a 53 y.o. male presenting with nosebleeds. The history is provided by the patient.  Epistaxis Associated symptoms: congestion     Past Medical History  Diagnosis Date  . COPD (chronic obstructive pulmonary disease)   . Hypertension   . PTSD (post-traumatic stress disorder)   . Depression    Past Surgical History  Procedure Laterality Date  . Appendectomy    . Tonsillectomy     No family history on file. History  Substance Use Topics  . Smoking status: Current Every Day Smoker -- 1.50 packs/day    Types: Cigarettes  . Smokeless tobacco: Not on file  . Alcohol Use: Yes     Comment: 4 40 oz beers per day.     Review of Systems  HENT: Positive for congestion, nosebleeds, postnasal drip and rhinorrhea.   All other systems reviewed and are negative.    Allergies  Review of patient's allergies indicates no known allergies.  Home Medications   Current Outpatient Rx  Name  Route  Sig  Dispense  Refill  . citalopram (CELEXA) 40 MG tablet   Oral   Take 40 mg by mouth daily.         . diphenhydrAMINE (BENADRYL) 25 MG  tablet   Oral   Take 1 tablet (25 mg total) by mouth every 6 (six) hours.   12 tablet   0   . hydrochlorothiazide (HYDRODIURIL) 25 MG tablet   Oral   Take 25 mg by mouth every morning.          Marland Kitchen OLANZapine (ZYPREXA) 20 MG tablet   Oral   Take 20 mg by mouth at bedtime.         . prazosin (MINIPRESS) 1 MG capsule   Oral   Take 1 mg by mouth at bedtime.         . predniSONE (DELTASONE) 10 MG tablet      6, 5, 4, 3, 2 then 1 tablet by mouth daily for 6 days total.   21 tablet   0    BP 142/87  Pulse 90  Temp(Src) 98.7 F (37.1 C) (Oral)  Resp 20  Ht 5\' 7"  (1.702 m)  Wt 170 lb (77.111 kg)  BMI 26.62 kg/m2  SpO2 97% Physical Exam  Nursing note and vitals reviewed. Constitutional: He is oriented to person, place, and time. He appears well-developed and well-nourished. No distress.  HENT:  Head: Normocephalic and atraumatic.  Nose: Mucosal edema present. No sinus tenderness or nasal septal hematoma. Epistaxis is observed.  Mouth/Throat: Uvula is midline and oropharynx is clear and moist.  Post nasal drip noted. Bleeding from anterior right and left nares. Nares patent.  Eyes: Conjunctivae are normal.  Neck: Normal range of motion. Neck supple.  Cardiovascular: Normal rate, regular rhythm and normal heart sounds.   Pulmonary/Chest: Effort normal and breath sounds normal.  Musculoskeletal: Normal range of motion. He exhibits no edema.  Neurological: He is alert and oriented to person, place, and time.  Skin: Skin is warm and dry. He is not diaphoretic.  Psychiatric: He has a normal mood and affect. His behavior is normal.    ED Course  EPISTAXIS MANAGEMENT Date/Time: 11/05/2013 9:17 AM Performed by: Illene Labrador Authorized by: Illene Labrador Consent: Verbal consent obtained. Consent given by: patient Patient understanding: patient states understanding of the procedure being performed Patient identity confirmed: verbally with patient Patient sedated:  no Treatment site: right anterior and left anterior Repair method: nasal balloon Post-procedure assessment: bleeding stopped Treatment complexity: simple Patient tolerance: Patient tolerated the procedure well with no immediate complications.   (including critical care time) Labs Review Labs Reviewed - No data to display Imaging Review No results found.  EKG Interpretation   None       MDM   1. Epistaxis    Pt presenting with epistaxis. Bleeding noted from bilateral nares. Afrin used, nasal balloon placed bilateral, 5.5 cm. Bleeding controlled after about 30 minutes. Superficial blood vessels on right nasal septum visible. Left nare normal. Advised him to not blow nose hard, take mucinex for his cold. VSS, BP 142/87. He is stable for discharge. Return precautions given. Patient states understanding of treatment care plan and is agreeable.     Illene Labrador, PA-C 11/05/13 [phone]

## 2013-11-05 NOTE — ED Notes (Signed)
Pt complain of nose bleed for two days. States he has had a cold

## 2014-10-20 ENCOUNTER — Encounter (HOSPITAL_COMMUNITY): Payer: Self-pay | Admitting: *Deleted

## 2014-10-20 ENCOUNTER — Emergency Department (HOSPITAL_COMMUNITY)
Admission: EM | Admit: 2014-10-20 | Discharge: 2014-10-20 | Disposition: A | Discharge status: Home / Self Care | Payer: Non-veteran care | Attending: Emergency Medicine | Admitting: Emergency Medicine

## 2014-10-20 DIAGNOSIS — Z79899 Other long term (current) drug therapy: Secondary | ICD-10-CM | POA: Diagnosis not present

## 2014-10-20 DIAGNOSIS — Z7952 Long term (current) use of systemic steroids: Secondary | ICD-10-CM | POA: Diagnosis not present

## 2014-10-20 DIAGNOSIS — Z720 Tobacco use: Secondary | ICD-10-CM | POA: Insufficient documentation

## 2014-10-20 DIAGNOSIS — F4310 Post-traumatic stress disorder, unspecified: Secondary | ICD-10-CM | POA: Diagnosis not present

## 2014-10-20 DIAGNOSIS — F329 Major depressive disorder, single episode, unspecified: Secondary | ICD-10-CM | POA: Diagnosis not present

## 2014-10-20 DIAGNOSIS — I10 Essential (primary) hypertension: Secondary | ICD-10-CM | POA: Insufficient documentation

## 2014-10-20 DIAGNOSIS — R55 Syncope and collapse: Secondary | ICD-10-CM | POA: Insufficient documentation

## 2014-10-20 LAB — BASIC METABOLIC PANEL
Anion gap: 4 — ABNORMAL LOW (ref 5–15)
BUN: 11 mg/dL (ref 6–23)
CALCIUM: 9 mg/dL (ref 8.4–10.5)
CHLORIDE: 106 meq/L (ref 96–112)
CO2: 27 mmol/L (ref 19–32)
Creatinine, Ser: 0.6 mg/dL (ref 0.50–1.35)
GFR calc Af Amer: >90 mL/min (ref >90)
GFR calc non Af Amer: >90 mL/min (ref >90)
GLUCOSE: 91 mg/dL (ref 70–99)
Potassium: 4 mmol/L (ref 3.5–5.1)
SODIUM: 137 mmol/L (ref 135–145)

## 2014-10-20 LAB — CBC WITH DIFFERENTIAL/PLATELET
BASOS PCT: 1 % (ref 0–1)
Basophils Absolute: 0.1 10*3/uL (ref 0.0–0.1)
EOS ABS: 0.6 10*3/uL (ref 0.0–0.7)
Eosinophils Relative: 6 % — ABNORMAL HIGH (ref 0–5)
HEMATOCRIT: 42.1 % (ref 39.0–52.0)
HEMOGLOBIN: 14.4 g/dL (ref 13.0–17.0)
Lymphocytes Relative: 23 % (ref 12–46)
Lymphs Abs: 2.3 10*3/uL (ref 0.7–4.0)
MCH: 29.1 pg (ref 26.0–34.0)
MCHC: 34.2 g/dL (ref 30.0–36.0)
MCV: 85.2 fL (ref 78.0–100.0)
Monocytes Absolute: 0.7 10*3/uL (ref 0.1–1.0)
Monocytes Relative: 7 % (ref 3–12)
NEUTROS ABS: 6.4 10*3/uL (ref 1.7–7.7)
Neutrophils Relative %: 63 % (ref 43–77)
PLATELETS: 219 10*3/uL (ref 150–400)
RBC: 4.94 MIL/uL (ref 4.22–5.81)
RDW: 14.4 % (ref 11.5–15.5)
WBC: 10.1 10*3/uL (ref 4.0–10.5)

## 2014-10-20 MED ORDER — SODIUM CHLORIDE 0.9 % IV BOLUS (SEPSIS)
1000.0000 mL | Freq: Once | INTRAVENOUS | Status: AC
  Administered 2014-10-20: 1000 mL via INTRAVENOUS

## 2014-10-20 NOTE — Discharge Instructions (Signed)
Stop drinking alcohol excessively and smoking excessively.  Increase fluids.  Eat regular meals

## 2014-10-20 NOTE — ED Notes (Signed)
CBG 88 

## 2014-10-20 NOTE — ED Provider Notes (Signed)
CSN: [id number]     Arrival date & time 10/20/14  1318 History  This chart was scribed for Nat Christen, MD by Lowella Petties, ED Scribe. The patient was seen in room APA09/APA09. Patient's care was started at 2:00 PM.     Chief Complaint  Patient presents with  . Loss of Consciousness   The history is provided by the patient. No language interpreter was used.   HPI Comments: Jason Rojas is a 54 y.o. male with a history of HTN and COPD who presents to the Emergency Department after a Syncopal event this afternoon. He states that he was having a cigarette and a beer in a bar chair this afternoon when he passed out and fell to the floor. He states that this has happened 3 times in the past 4 weeks. He states that he sleeps with a CPAP for sleep apnea. He reports a history of baseline back and foot pain. He states that he smokes 1 PPD and he drinks 12 beers regularly.   Past Medical History  Diagnosis Date  . COPD (chronic obstructive pulmonary disease)   . Hypertension   . PTSD (post-traumatic stress disorder)   . Depression    Past Surgical History  Procedure Laterality Date  . Appendectomy    . Tonsillectomy     No family history on file. History  Substance Use Topics  . Smoking status: Current Every Day Smoker -- 1.50 packs/day    Types: Cigarettes  . Smokeless tobacco: Not on file  . Alcohol Use: Yes     Comment: 4 40 oz beers per day.     Review of Systems  Musculoskeletal: Positive for back pain.  Neurological: Positive for syncope.   A complete 10 system review of systems was obtained and all systems are negative except as noted in the HPI and PMH.    Allergies  Review of patient's allergies indicates no known allergies.  Home Medications   Prior to Admission medications   Medication Sig Start Date End Date Taking? Authorizing Provider  acetaminophen (TYLENOL) 500 MG tablet Take 500 mg by mouth every 6 (six) hours as needed for moderate pain.   Yes Historical  Provider, MD  amLODipine (NORVASC) 10 MG tablet Take 5 mg by mouth daily.   Yes Historical Provider, MD  aspirin-sod bicarb-citric acid (ALKA-SELTZER) 325 MG TBEF tablet Take 650 mg by mouth every 6 (six) hours as needed (cold).   Yes Historical Provider, MD  gabapentin (NEURONTIN) 800 MG tablet Take 800 mg by mouth 3 (three) times daily.   Yes Historical Provider, MD  methocarbamol (ROBAXIN) 750 MG tablet Take 750 mg by mouth 3 (three) times daily as needed for muscle spasms.   Yes Historical Provider, MD  sildenafil (VIAGRA) 100 MG tablet Take 100 mg by mouth daily as needed for erectile dysfunction.   Yes Historical Provider, MD  diphenhydrAMINE (BENADRYL) 25 MG tablet Take 1 tablet (25 mg total) by mouth every 6 (six) hours. Patient not taking: Reported on 10/20/2014 09/22/13   Evalee Jefferson, PA-C  predniSONE (DELTASONE) 10 MG tablet 6, 5, 4, 3, 2 then 1 tablet by mouth daily for 6 days total. Patient not taking: Reported on 10/20/2014 09/22/13   Evalee Jefferson, PA-C   Triage Vitals: BP 152/94 mmHg  Pulse 84  Temp(Src) 97.8 F (36.6 C) (Oral)  Resp 16  Ht 5\' 6"  (1.676 m)  Wt 170 lb (77.111 kg)  BMI 27.45 kg/m2  SpO2 98% Physical Exam  Constitutional:  He is oriented to person, place, and time. He appears well-developed and well-nourished.  HENT:  Head: Normocephalic and atraumatic.  Eyes: Conjunctivae and EOM are normal. Pupils are equal, round, and reactive to light. No scleral icterus.  Neck: Normal range of motion. Neck supple.  Cardiovascular: Normal rate, regular rhythm and normal heart sounds.   No murmur heard. Pulmonary/Chest: Effort normal and breath sounds normal. No respiratory distress. He has no wheezes. He has no rales. He exhibits no tenderness.  Abdominal: Soft. Bowel sounds are normal. He exhibits no distension and no mass. There is no tenderness. There is no rebound and no guarding.  Musculoskeletal: Normal range of motion. He exhibits no edema or tenderness.  Neurological:  He is alert and oriented to person, place, and time. He has normal reflexes. No cranial nerve deficit.  Skin: Skin is warm and dry.  Psychiatric: He has a normal mood and affect. His behavior is normal.  Nursing note and vitals reviewed.   ED Course  Procedures (including critical care time) DIAGNOSTIC STUDIES: Oxygen Saturation is 98% on room air, normal by my interpretation.    COORDINATION OF CARE: 2:04 PM-Discussed treatment plan which includes lab work and EKG with pt at bedside and pt agreed to plan.   Labs Review Labs Reviewed  BASIC METABOLIC PANEL - Abnormal; Notable for the following:    Anion gap 4 (*)    All other components within normal limits  CBC WITH DIFFERENTIAL - Abnormal; Notable for the following:    Eosinophils Relative 6 (*)    All other components within normal limits  CBG MONITORING, ED    Imaging Review No results found.   EKG Interpretation   Date/Time:  Wednesday October 20 2014 13:51:22 EST Ventricular Rate:  83 PR Interval:  154 QRS Duration: 91 QT Interval:  381 QTC Calculation: 448 R Axis:   33 Text Interpretation:  Sinus rhythm Borderline low voltage, extremity leads  Baseline wander in lead(s) V5 Confirmed by Clavin Ruhlman  MD, Mahir Prabhakar ([id number]) on  10/20/2014 1:59:01 PM      MDM   Final diagnoses:  Syncope, unspecified syncope type   Patient has normal physical exam. He admits to drinking alcohol and smoking cigarettes excessively. Hemoglobin stable. EKG normal. No neurological deficits  I personally performed the services described in this documentation, which was scribed in my presence. The recorded information has been reviewed and is accurate.    Nat Christen, MD 10/20/14 [phone]

## 2014-10-20 NOTE — ED Notes (Signed)
Pt states he has "fell out" 2-3 times this past week. Last passed out an hour PTA while sitting at home on a bar stool, watching television. NAD. Pt ambulated to room without difficulty.

## 2014-10-21 LAB — CBG MONITORING, ED: GLUCOSE-CAPILLARY: 88 mg/dL (ref 70–99)

## 2016-06-12 ENCOUNTER — Encounter (HOSPITAL_COMMUNITY): Payer: Self-pay | Admitting: Emergency Medicine

## 2016-06-12 ENCOUNTER — Emergency Department (HOSPITAL_COMMUNITY)
Admission: EM | Admit: 2016-06-12 | Discharge: 2016-06-12 | Disposition: A | Discharge status: Home / Self Care | Payer: Medicaid Other | Attending: Emergency Medicine | Admitting: Emergency Medicine

## 2016-06-12 DIAGNOSIS — L089 Local infection of the skin and subcutaneous tissue, unspecified: Secondary | ICD-10-CM

## 2016-06-12 DIAGNOSIS — Z79899 Other long term (current) drug therapy: Secondary | ICD-10-CM | POA: Diagnosis not present

## 2016-06-12 DIAGNOSIS — Z7982 Long term (current) use of aspirin: Secondary | ICD-10-CM | POA: Insufficient documentation

## 2016-06-12 DIAGNOSIS — I10 Essential (primary) hypertension: Secondary | ICD-10-CM | POA: Diagnosis not present

## 2016-06-12 DIAGNOSIS — J449 Chronic obstructive pulmonary disease, unspecified: Secondary | ICD-10-CM | POA: Diagnosis not present

## 2016-06-12 DIAGNOSIS — L723 Sebaceous cyst: Secondary | ICD-10-CM | POA: Insufficient documentation

## 2016-06-12 DIAGNOSIS — F17210 Nicotine dependence, cigarettes, uncomplicated: Secondary | ICD-10-CM | POA: Insufficient documentation

## 2016-06-12 HISTORY — DX: Other chronic pain: G89.29

## 2016-06-12 HISTORY — DX: Dorsalgia, unspecified: M54.9

## 2016-06-12 MED ORDER — IBUPROFEN 600 MG PO TABS: 600.0000 mg | ORAL_TABLET | Freq: Four times a day (QID) | ORAL | 0 refills | Status: DC | PRN

## 2016-06-12 MED ORDER — DOXYCYCLINE HYCLATE 100 MG PO CAPS: 100.0000 mg | ORAL_CAPSULE | Freq: Two times a day (BID) | ORAL | 0 refills | Status: AC

## 2016-06-12 MED ORDER — IBUPROFEN 800 MG PO TABS
800.0000 mg | ORAL_TABLET | Freq: Once | ORAL | Status: AC
  Administered 2016-06-12: 800 mg via ORAL
  Filled 2016-06-12: qty 1

## 2016-06-12 MED ORDER — HYDROCODONE-ACETAMINOPHEN 5-325 MG PO TABS: 1.0000 | ORAL_TABLET | ORAL | 0 refills | Status: DC | PRN

## 2016-06-12 MED ORDER — ONDANSETRON HCL 4 MG PO TABS
4.0000 mg | ORAL_TABLET | Freq: Once | ORAL | Status: AC
  Administered 2016-06-12: 4 mg via ORAL
  Filled 2016-06-12: qty 1

## 2016-06-12 MED ORDER — LIDOCAINE HCL (PF) 1 % IJ SOLN
INTRAMUSCULAR | Status: AC
  Filled 2016-06-12: qty 5

## 2016-06-12 MED ORDER — DOXYCYCLINE HYCLATE 100 MG PO TABS
100.0000 mg | ORAL_TABLET | Freq: Once | ORAL | Status: AC
  Administered 2016-06-12: 100 mg via ORAL
  Filled 2016-06-12: qty 1

## 2016-06-12 NOTE — ED Notes (Signed)
Bandage applied, Patient given discharge instruction, verbalized understand. Patient ambulatory out of the department.  

## 2016-06-12 NOTE — ED Provider Notes (Signed)
[facility] DEPT Provider Note   CSN: [id number] Arrival date & time: 06/12/16  1502     History   Chief Complaint Chief Complaint  Patient presents with  . Abscess    HPI Jason Rojas is a 55 y.o. male.  The history is provided by the patient.  Abscess  Location:  Torso Torso abscess location:  Upper back Abscess quality: induration, painful and redness   Abscess quality: not draining   Duration:  1 week Progression:  Worsening Pain details:    Quality:  Aching and shooting   Severity:  Moderate   Duration:  1 week   Timing:  Intermittent   Progression:  Worsening Chronicity:  New Context: not diabetes and not immunosuppression   Relieved by:  Nothing Worsened by:  Nothing Ineffective treatments:  None tried Associated symptoms: no fever, no nausea and no vomiting     Past Medical History:  Diagnosis Date  . Chronic back pain   . COPD (chronic obstructive pulmonary disease) ([place])   . Depression   . Hypertension   . PTSD (post-traumatic stress disorder)     There are no active problems to display for this patient.   Past Surgical History:  Procedure Laterality Date  . APPENDECTOMY    . TONSILLECTOMY         Home Medications    Prior to Admission medications   Medication Sig Start Date End Date Taking? Authorizing Provider  amLODipine (NORVASC) 10 MG tablet Take 5 mg by mouth daily.   Yes Historical Provider, MD  aspirin-sod bicarb-citric acid (ALKA-SELTZER) 325 MG TBEF tablet Take 650 mg by mouth every 6 (six) hours as needed (cold).   Yes Historical Provider, MD  gabapentin (NEURONTIN) 800 MG tablet Take 800 mg by mouth daily as needed (for pain/nerves).    Yes Historical Provider, MD  sildenafil (VIAGRA) 100 MG tablet Take 100 mg by mouth daily as needed for erectile dysfunction.   Yes Historical Provider, MD    Family History No family history on file.  Social History Social History  Substance Use Topics  . Smoking status: Current  Every Day Smoker    Packs/day: 1.50    Types: Cigarettes  . Smokeless tobacco: Never Used  . Alcohol use Yes     Comment: 2 40 oz beers per day.      Allergies   Review of patient's allergies indicates no known allergies.   Review of Systems Review of Systems  Constitutional: Negative for fever.  Gastrointestinal: Negative for nausea and vomiting.  Musculoskeletal: Positive for back pain.  Skin:       abscess  All other systems reviewed and are negative.    Physical Exam Updated Vital Signs BP (!) 166/111 (BP Location: Right Arm)   Pulse 99   Temp 98.9 F (37.2 C) (Oral)   Resp 19   Ht 5\' 7"  (1.702 m)   Wt 74.8 kg   SpO2 98%   BMI 25.84 kg/m   Physical Exam  Constitutional: He is oriented to person, place, and time. He appears well-developed and well-nourished.  Non-toxic appearance.  HENT:  Head: Normocephalic.  Right Ear: Tympanic membrane and external ear normal.  Left Ear: Tympanic membrane and external ear normal.  Eyes: EOM and lids are normal. Pupils are equal, round, and reactive to light.  Neck: Normal range of motion. Neck supple. Carotid bruit is not present.  Cardiovascular: Normal rate, regular rhythm, normal heart sounds, intact distal pulses and normal pulses.  Pulmonary/Chest: Breath sounds normal. No respiratory distress.  Abdominal: Soft. Bowel sounds are normal. There is no tenderness. There is no guarding.  Musculoskeletal: Normal range of motion.       Back:  Lymphadenopathy:       Head (right side): No submandibular adenopathy present.       Head (left side): No submandibular adenopathy present.    He has no cervical adenopathy.  Neurological: He is alert and oriented to person, place, and time. He has normal strength. No cranial nerve deficit or sensory deficit.  Skin: Skin is warm and dry.  Large infected cyst of the upper back.  Psychiatric: He has a normal mood and affect. His speech is normal.  Nursing note and vitals  reviewed.    ED Treatments / Results  Labs (all labs ordered are listed, but only abnormal results are displayed) Labs Reviewed - No data to display  EKG  EKG Interpretation None       Radiology No results found.  Procedures .Marland KitchenIncision and Drainage Date/Time: 06/12/2016 5:04 PM Performed by: Lily Kocher Authorized by: Lily Kocher   Consent:    Consent obtained:  Verbal   Consent given by:  Patient   Risks discussed:  Bleeding and pain   Alternatives discussed:  Referral Location:    Type:  Cyst   Location:  Trunk   Trunk location:  Back Pre-procedure details:    Procedure prep: alcohol. Procedure type:    Complexity:  Complex (breaking up loculations) Procedure details:    Incision types:  Single straight   Incision depth:  Subcutaneous   Scalpel blade:  11   Wound management:  Probed and deloculated and extensive cleaning   Drainage:  Purulent   Drainage amount:  Copious   Wound treatment:  Wound left open   Packing materials:  None Post-procedure details:    Patient tolerance of procedure:  Tolerated well, no immediate complications   (including critical care time)  Medications Ordered in ED Medications - No data to display   Initial Impression / Assessment and Plan / ED Course  I have reviewed the triage vital signs and the nursing notes.  Pertinent labs & imaging results that were available during my care of the patient were reviewed by me and considered in my medical decision making (see chart for details).  Clinical Course    *I have reviewed nursing notes, vital signs, and all appropriate lab and imaging results for this patient.**  Final Clinical Impressions(s) / ED Diagnoses  Patient has a large infected sebaceous cyst of the upper back. Incision and drainage carried out. I've instructed the patient that he will need to see a surgeon to have the remaining portion of the cyst removed on. Culture has been sent to the lab. Patient started  on doxycycline, ibuprofen, and Norco for pain. Patient will start warm Epsom salt soaks. Patient is in agreement with this discharge plan.    Final diagnoses:  None    New Prescriptions New Prescriptions   No medications on file     Lily Kocher, PA-C 06/12/16 [place], MD 06/28/16 2205

## 2016-06-12 NOTE — ED Triage Notes (Signed)
Abscess to left upper back x 1 week.

## 2016-06-12 NOTE — Discharge Instructions (Signed)
Please soak in a tub of warm Epsom salt water for 15 minutes daily until the wound has healed from the inside out. You have a sebaceous cyst that was infected. You will need to have the cyst itself removed by surgeon after this begins to heal. Please use doxycycline 2 times daily with food. Use 600 mg of ibuprofen with breakfast, lunch, and dinner and bedtime. Use Norco for more severe pain.This medication may cause drowsiness. Please do not drink, drive, or participate in activity that requires concentration while taking this medication.

## 2016-06-12 NOTE — ED Notes (Signed)
Lac tray set up at the bedside

## 2016-06-15 LAB — AEROBIC CULTURE W GRAM STAIN (SUPERFICIAL SPECIMEN)

## 2016-06-15 LAB — AEROBIC CULTURE  (SUPERFICIAL SPECIMEN): CULTURE: NO GROWTH

## 2018-09-01 ENCOUNTER — Encounter: Payer: Self-pay | Admitting: Gastroenterology

## 2018-11-24 ENCOUNTER — Encounter: Payer: Self-pay | Admitting: *Deleted

## 2018-11-24 ENCOUNTER — Other Ambulatory Visit: Payer: Self-pay | Admitting: *Deleted

## 2018-11-24 ENCOUNTER — Encounter: Payer: Self-pay | Admitting: Gastroenterology

## 2018-11-24 ENCOUNTER — Ambulatory Visit (INDEPENDENT_AMBULATORY_CARE_PROVIDER_SITE_OTHER): Payer: No Typology Code available for payment source | Admitting: Gastroenterology

## 2018-11-24 VITALS — BP 149/92 | HR 87 | Temp 98.8°F | Ht 66.5 in | Wt 170.4 lb

## 2018-11-24 DIAGNOSIS — R634 Abnormal weight loss: Secondary | ICD-10-CM | POA: Insufficient documentation

## 2018-11-24 DIAGNOSIS — Z86010 Personal history of colonic polyps: Secondary | ICD-10-CM | POA: Diagnosis not present

## 2018-11-24 NOTE — Patient Instructions (Signed)
We have scheduled you for a colonoscopy with possible upper endoscopy in the near future with Dr. Gala Romney.  Further recommendations to follow!  It was a pleasure to see you today. I strive to create trusting relationships with patients to provide genuine, compassionate, and quality care. I value your feedback. If you receive a survey regarding your visit,  I greatly appreciate you taking time to fill this out.   Annitta Needs, PhD, ANP-BC [facility]

## 2018-11-24 NOTE — Assessment & Plan Note (Signed)
Pleasant 58 year old male with history of multiple adenomas in 2015 and adenomas in 2010 at outside facility per outside records. Due for surveillance now. No concerning lower GI signs/symptoms.   Proceed with TCS with Dr. Gala Romney in near future: the risks, benefits, and alternatives have been discussed with the patient in detail. The patient states understanding and desires to proceed. Propofol due to polypharmacy

## 2018-11-24 NOTE — Progress Notes (Signed)
Primary Care Physician:  [facility] Primary Gastroenterologist:  Dr. Gala Romney   Chief Complaint  Patient presents with  . Consult    TCS/EGD. last tcs ? 4 yrs ago. Had polyps.    HPI:   Jason Rojas is a 58 y.o. male presenting today at the request of the [facility] secondary to need for surveillance colonoscopy. Per notes received, he has history of adenomatous polyps in 2010 and 2015 at outside facilities. Multiple adenomas in 2015. He has had unintentional weight loss per notes with request for EGD at time of colonoscopy.    States his baseline weight is 180. He had lost down to 160 a few months ago but now 170. Noted loss of appetite, but improved some. Didn't feel like eating breakfast but did eat lunch. No N/V. No abdominal pain. No reflux. No dysphagia. No changes in bowel habits. No rectal bleeding.   Past Medical History:  Diagnosis Date  . Chronic back pain   . COPD (chronic obstructive pulmonary disease) ([place])   . Depression   . Hypertension   . PTSD (post-traumatic stress disorder)     Past Surgical History:  Procedure Laterality Date  . APPENDECTOMY    . COLONOSCOPY     2010 and 2015, adenomas  . TONSILLECTOMY      Current Outpatient Medications  Medication Sig Dispense Refill  . amLODipine (NORVASC) 10 MG tablet Take 5 mg by mouth daily.    Marland Kitchen gabapentin (NEURONTIN) 800 MG tablet Take 800 mg by mouth daily as needed (for pain/nerves).     Marland Kitchen ibuprofen (ADVIL,MOTRIN) 200 MG tablet Take 200 mg by mouth every 6 (six) hours as needed.    . sildenafil (VIAGRA) 100 MG tablet Take 100 mg by mouth daily as needed for erectile dysfunction.     No current facility-administered medications for this visit.     Allergies as of 11/24/2018  . (No Known Allergies)    Family History  Problem Relation Age of Onset  . Colon cancer Neg Hx     Social History   Socioeconomic History  . Marital status: Divorced    Spouse name: Not on file  . Number of  children: Not on file  . Years of education: Not on file  . Highest education level: Not on file  Occupational History  . Not on file  Social Needs  . Financial resource strain: Not on file  . Food insecurity:    Worry: Not on file    Inability: Not on file  . Transportation needs:    Medical: Not on file    Non-medical: Not on file  Tobacco Use  . Smoking status: Current Every Day Smoker    Packs/day: 1.50    Types: Cigarettes  . Smokeless tobacco: Never Used  Substance and Sexual Activity  . Alcohol use: Yes    Comment: 4-5 beers daily  . Drug use: No    Types: Cocaine    Comment: history, none in 3-4 years   . Sexual activity: Never  Lifestyle  . Physical activity:    Days per week: Not on file    Minutes per session: Not on file  . Stress: Not on file  Relationships  . Social connections:    Talks on phone: Not on file    Gets together: Not on file    Attends religious service: Not on file    Active member of club or organization: Not on file  Attends meetings of clubs or organizations: Not on file    Relationship status: Not on file  . Intimate partner violence:    Fear of current or ex partner: Not on file    Emotionally abused: Not on file    Physically abused: Not on file    Forced sexual activity: Not on file  Other Topics Concern  . Not on file  Social History Narrative  . Not on file    Review of Systems: Gen: Denies any fever, chills, fatigue, weight loss, lack of appetite.  CV: Denies chest pain, heart palpitations, peripheral edema, syncope.  Resp: Denies shortness of breath at rest or with exertion. Denies wheezing or cough.  GI: see HPI GU : Denies urinary burning, urinary frequency, urinary hesitancy MS: Denies joint pain, muscle weakness, cramps, or limitation of movement.  Derm: Denies rash, itching, dry skin Psych: Denies depression, anxiety, memory loss, and confusion Heme: Denies bruising, bleeding, and enlarged lymph  nodes.  Physical Exam: BP (!) 149/92   Pulse 87   Temp 98.8 F (37.1 C) (Oral)   Ht 5' 6.5" (1.689 m)   Wt 170 lb 6.4 oz (77.3 kg)   BMI 27.09 kg/m  General:   Alert and oriented. Pleasant and cooperative. Well-nourished and well-developed.  Head:  Normocephalic and atraumatic. Eyes:  Without icterus, sclera clear and conjunctiva pink.  Ears:  Normal auditory acuity. Nose:  No deformity, discharge,  or lesions. Mouth:  No deformity or lesions, oral mucosa pink.  Lungs:  Clear to auscultation bilaterally. No wheezes, rales, or rhonchi. No distress.  Heart:  S1, S2 present without murmurs appreciated.  Abdomen:  +BS, soft, non-tender and non-distended. No HSM noted. No guarding or rebound. No masses appreciated.  Rectal:  Deferred  Msk:  Symmetrical without gross deformities. Normal posture. Extremities:  Without edema. Neurologic:  Alert and  oriented x4 Psych:  Alert and cooperative. Normal mood and affect.

## 2018-11-24 NOTE — Assessment & Plan Note (Signed)
Reported loss of weight per outside notes. Baseline 180, losing down to 160 several months ago but now regaining and up to 170. Appetite waxes and wanes. Only vague loss of appetite noted without any other signs/symptoms. Will pursue possible EGD at time of colonoscopy.  Proceed with possible upper endoscopy in the near future with Dr. Gala Romney. The risks, benefits, and alternatives have been discussed in detail with patient. They have stated understanding and desire to proceed.  Propofol due to polypharmacy

## 2018-11-25 ENCOUNTER — Telehealth: Payer: Self-pay | Admitting: *Deleted

## 2018-11-25 NOTE — Telephone Encounter (Signed)
Pre-op scheduled for 01/15/2019 at 11:00am. Letter mailed. Called pt and male answered stating pt was NA.

## 2018-11-25 NOTE — Progress Notes (Signed)
cc'd to pcp 

## 2019-01-08 ENCOUNTER — Telehealth: Payer: Self-pay | Admitting: *Deleted

## 2019-01-08 NOTE — Telephone Encounter (Signed)
LMTCB

## 2019-01-12 NOTE — Telephone Encounter (Signed)
The [place] auth expires May 23, 2019. Is he still going to need a new auth?

## 2019-01-12 NOTE — Telephone Encounter (Signed)
Called patient and he is r/s'd for 6/22 at 12pm. I have mailed new instructions/pre-op appt to patient. Confirmed mailing address.   Manuela Schwartz, patient [facility] will expire prior to procedure being done. Thanks

## 2019-01-15 ENCOUNTER — Other Ambulatory Visit (HOSPITAL_COMMUNITY): Payer: Non-veteran care

## 2019-03-13 ENCOUNTER — Encounter (HOSPITAL_COMMUNITY): Payer: Self-pay

## 2019-03-13 ENCOUNTER — Other Ambulatory Visit: Payer: Self-pay

## 2019-03-16 ENCOUNTER — Other Ambulatory Visit: Payer: Self-pay

## 2019-03-16 ENCOUNTER — Encounter (HOSPITAL_COMMUNITY)
Admission: RE | Admit: 2019-03-16 | Discharge: 2019-03-16 | Disposition: A | Discharge status: Home / Self Care | Payer: Non-veteran care | Source: Ambulatory Visit | Attending: Internal Medicine | Admitting: Internal Medicine

## 2019-03-19 ENCOUNTER — Other Ambulatory Visit: Payer: Self-pay

## 2019-03-19 ENCOUNTER — Other Ambulatory Visit (HOSPITAL_COMMUNITY)
Admission: RE | Admit: 2019-03-19 | Discharge: 2019-03-19 | Disposition: A | Discharge status: Home / Self Care | Payer: No Typology Code available for payment source | Source: Ambulatory Visit | Attending: Internal Medicine | Admitting: Internal Medicine

## 2019-03-19 DIAGNOSIS — Z1159 Encounter for screening for other viral diseases: Secondary | ICD-10-CM | POA: Insufficient documentation

## 2019-03-20 LAB — NOVEL CORONAVIRUS, NAA (HOSP ORDER, SEND-OUT TO REF LAB; TAT 18-24 HRS): SARS-CoV-2, NAA: NOT DETECTED

## 2019-03-23 ENCOUNTER — Encounter (HOSPITAL_COMMUNITY): Payer: Self-pay

## 2019-03-23 ENCOUNTER — Ambulatory Visit (HOSPITAL_COMMUNITY): Payer: No Typology Code available for payment source | Admitting: Certified Registered Nurse Anesthetist

## 2019-03-23 ENCOUNTER — Encounter (HOSPITAL_COMMUNITY)
Admission: RE | Disposition: A | Discharge status: Home / Self Care | Payer: Self-pay | Source: Home / Self Care | Attending: Internal Medicine

## 2019-03-23 ENCOUNTER — Ambulatory Visit (HOSPITAL_COMMUNITY)
Admission: RE | Admit: 2019-03-23 | Discharge: 2019-03-23 | Disposition: A | Discharge status: Home / Self Care | Payer: No Typology Code available for payment source | Attending: Internal Medicine | Admitting: Internal Medicine

## 2019-03-23 ENCOUNTER — Other Ambulatory Visit: Payer: Self-pay

## 2019-03-23 DIAGNOSIS — R634 Abnormal weight loss: Secondary | ICD-10-CM | POA: Diagnosis present

## 2019-03-23 DIAGNOSIS — J449 Chronic obstructive pulmonary disease, unspecified: Secondary | ICD-10-CM | POA: Insufficient documentation

## 2019-03-23 DIAGNOSIS — K210 Gastro-esophageal reflux disease with esophagitis: Secondary | ICD-10-CM | POA: Diagnosis not present

## 2019-03-23 DIAGNOSIS — Z79899 Other long term (current) drug therapy: Secondary | ICD-10-CM | POA: Insufficient documentation

## 2019-03-23 DIAGNOSIS — K209 Esophagitis, unspecified: Secondary | ICD-10-CM

## 2019-03-23 DIAGNOSIS — D122 Benign neoplasm of ascending colon: Secondary | ICD-10-CM | POA: Diagnosis not present

## 2019-03-23 DIAGNOSIS — F17210 Nicotine dependence, cigarettes, uncomplicated: Secondary | ICD-10-CM | POA: Insufficient documentation

## 2019-03-23 DIAGNOSIS — K635 Polyp of colon: Secondary | ICD-10-CM

## 2019-03-23 DIAGNOSIS — K648 Other hemorrhoids: Secondary | ICD-10-CM | POA: Insufficient documentation

## 2019-03-23 DIAGNOSIS — K2950 Unspecified chronic gastritis without bleeding: Secondary | ICD-10-CM | POA: Insufficient documentation

## 2019-03-23 DIAGNOSIS — G4730 Sleep apnea, unspecified: Secondary | ICD-10-CM | POA: Insufficient documentation

## 2019-03-23 DIAGNOSIS — Z86010 Personal history of colonic polyps: Secondary | ICD-10-CM | POA: Diagnosis not present

## 2019-03-23 DIAGNOSIS — K269 Duodenal ulcer, unspecified as acute or chronic, without hemorrhage or perforation: Secondary | ICD-10-CM

## 2019-03-23 DIAGNOSIS — R630 Anorexia: Secondary | ICD-10-CM | POA: Diagnosis not present

## 2019-03-23 DIAGNOSIS — I10 Essential (primary) hypertension: Secondary | ICD-10-CM | POA: Insufficient documentation

## 2019-03-23 DIAGNOSIS — Z1211 Encounter for screening for malignant neoplasm of colon: Secondary | ICD-10-CM | POA: Diagnosis not present

## 2019-03-23 HISTORY — DX: Sleep apnea, unspecified: G47.30

## 2019-03-23 HISTORY — PX: BIOPSY: SHX5522

## 2019-03-23 HISTORY — PX: POLYPECTOMY: SHX5525

## 2019-03-23 HISTORY — PX: ESOPHAGOGASTRODUODENOSCOPY (EGD) WITH PROPOFOL: SHX5813

## 2019-03-23 HISTORY — PX: COLONOSCOPY WITH PROPOFOL: SHX5780

## 2019-03-23 SURGERY — COLONOSCOPY WITH PROPOFOL
Anesthesia: Monitor Anesthesia Care

## 2019-03-23 MED ORDER — HYDROMORPHONE HCL 1 MG/ML IJ SOLN: 0.2500 mg | INTRAMUSCULAR | Status: DC | PRN

## 2019-03-23 MED ORDER — PROPOFOL 500 MG/50ML IV EMUL
INTRAVENOUS | Status: DC | PRN
  Administered 2019-03-23: 100 ug/kg/min via INTRAVENOUS

## 2019-03-23 MED ORDER — PROPOFOL 10 MG/ML IV BOLUS
INTRAVENOUS | Status: DC | PRN
  Administered 2019-03-23 (×3): 50 mg via INTRAVENOUS

## 2019-03-23 MED ORDER — CHLORHEXIDINE GLUCONATE CLOTH 2 % EX PADS: 6.0000 | MEDICATED_PAD | Freq: Once | CUTANEOUS | Status: DC

## 2019-03-23 MED ORDER — FENTANYL CITRATE (PF) 100 MCG/2ML IJ SOLN
INTRAMUSCULAR | Status: DC | PRN
  Administered 2019-03-23: 50 ug via INTRAVENOUS

## 2019-03-23 MED ORDER — HYDROCODONE-ACETAMINOPHEN 7.5-325 MG PO TABS
1.0000 | ORAL_TABLET | Freq: Once | ORAL | Status: AC | PRN
  Administered 2019-03-23: 11:00:00 1 via ORAL
  Filled 2019-03-23: qty 1

## 2019-03-23 MED ORDER — LIDOCAINE HCL (CARDIAC) PF 100 MG/5ML IV SOSY
PREFILLED_SYRINGE | INTRAVENOUS | Status: DC | PRN
  Administered 2019-03-23: 50 mg via INTRAVENOUS

## 2019-03-23 MED ORDER — LACTATED RINGERS IV SOLN: INTRAVENOUS | Status: DC

## 2019-03-23 MED ORDER — PROMETHAZINE HCL 25 MG/ML IJ SOLN: 6.2500 mg | INTRAMUSCULAR | Status: DC | PRN

## 2019-03-23 MED ORDER — FENTANYL CITRATE (PF) 100 MCG/2ML IJ SOLN
INTRAMUSCULAR | Status: AC
  Filled 2019-03-23: qty 2

## 2019-03-23 MED ORDER — PROPOFOL 500 MG/50ML IV EMUL: INTRAVENOUS | Status: DC | PRN

## 2019-03-23 MED ORDER — LACTATED RINGERS IV SOLN
INTRAVENOUS | Status: DC
  Administered 2019-03-23: 10:00:00 via INTRAVENOUS

## 2019-03-23 MED ORDER — PROPOFOL 10 MG/ML IV BOLUS
INTRAVENOUS | Status: AC
Start: 2019-03-23 — End: ?
  Filled 2019-03-23: qty 40

## 2019-03-23 MED ORDER — MEPERIDINE HCL 50 MG/ML IJ SOLN: 6.2500 mg | INTRAMUSCULAR | Status: DC | PRN

## 2019-03-23 NOTE — Anesthesia Postprocedure Evaluation (Signed)
Anesthesia Post Note  Patient: Jason Rojas  Procedure(s) Performed: COLONOSCOPY WITH PROPOFOL (N/A ) ESOPHAGOGASTRODUODENOSCOPY (EGD) WITH PROPOFOL (N/A ) BIOPSY POLYPECTOMY  Patient location during evaluation: Endoscopy Anesthesia Type: MAC Level of consciousness: awake and alert Pain management: pain level controlled Vital Signs Assessment: post-procedure vital signs reviewed and stable Respiratory status: spontaneous breathing, nonlabored ventilation, respiratory function stable and patient connected to nasal cannula oxygen Cardiovascular status: blood pressure returned to baseline and stable Postop Assessment: no apparent nausea or vomiting Anesthetic complications: no     Last Vitals:  Vitals:   03/23/19 1132 03/23/19 1133  BP: (!) 184/92   Pulse: (!) 54 65  Resp: 16 18  Temp:    SpO2: 98% 98%    Last Pain:  Vitals:   03/23/19 1106  TempSrc:   PainSc: 0-No pain                 Talitha Givens

## 2019-03-23 NOTE — Op Note (Signed)
[facility] Patient Name: Jason Rojas Procedure Date: 03/23/2019 10:05 AM MRN: [id number] Date of Birth: May 09, 1961 Attending MD: Norvel Richards , MD CSN: [id number] Age: 58 Admit Type: Outpatient Procedure:                Upper GI endoscopy Indications:              Weight loss Providers:                Norvel Richards, MD, Otis Peak B. Sharon Seller, RN,                            Raphael Gibney, Technician Referring MD:              Medicines:                Propofol per Anesthesia Complications:            No immediate complications. Estimated Blood Loss:     Estimated blood loss was minimal. Procedure:                Pre-Anesthesia Assessment:                           - Prior to the procedure, a History and Physical                            was performed, and patient medications and                            allergies were reviewed. The patient's tolerance of                            previous anesthesia was also reviewed. The risks                            and benefits of the procedure and the sedation                            options and risks were discussed with the patient.                            All questions were answered, and informed consent                            was obtained. Prior Anticoagulants: The patient has                            taken no previous anticoagulant or antiplatelet                            agents. ASA Grade Assessment: II - A patient with                            mild systemic disease. After reviewing the risks  and benefits, the patient was deemed in                            satisfactory condition to undergo the procedure.                           After obtaining informed consent, the endoscope was                            passed under direct vision. Throughout the                            procedure, the patient's blood pressure, pulse, and                            oxygen saturations  were monitored continuously. The                            [id number]-H190 ([id number]) scope was introduced through the                            mouth, and advanced to the second part of duodenum.                            The upper GI endoscopy was accomplished without                            difficulty. The patient tolerated the procedure                            well. Scope In: 10:28:36 AM Scope Out: 10:33:22 AM Total Procedure Duration: 0 hours 4 minutes 46 seconds  Findings:      Erosions within 5 mm the GE junction. Multiple. Accentuated Z line. No       Barrett's epithelium seen. No varices seen.      Diffuse mucosal changes were found in the entire examined stomach. Some       fish scale/snakeskin appearance of the gastric mucosa. Mottling of the       mucosa present.      Multiple diffuse erosions were found in the duodenal bulb. These       extended into the proximal second portion. No ulcer. Biopsies of the       abnormal gastric mucosa taken for histologic study. Impression:               -Mild erosive reflux esophagitis.                           -Abnormal gastric mucosa. Status post biopsy                            mucosal changes in the stomach.                           - Duodenal erosions.                           -  No specimens collected. Moderate Sedation:      Moderate (conscious) sedation was personally administered by an       anesthesia professional. The following parameters were monitored: oxygen       saturation, heart rate, blood pressure, respiratory rate, EKG, adequacy       of pulmonary ventilation, and response to care. Recommendation:           - Patient has a contact number available for                            emergencies. The signs and symptoms of potential                            delayed complications were discussed with the                            patient. Return to normal activities tomorrow.                            Written discharge  instructions were provided to the                            patient.                           - Resume previous diet.                           - Continue present medications.                           -Follow-up on pathology.- See colonoscopy report. Procedure Code(s):        --- Professional ---                           [id number], Esophagogastroduodenoscopy, flexible,                            transoral; diagnostic, including collection of                            specimen(s) by brushing or washing, when performed                            (separate procedure) Diagnosis Code(s):        --- Professional ---                           K20.9, Esophagitis, unspecified                           K31.89, Other diseases of stomach and duodenum                           K26.9, Duodenal ulcer, unspecified as acute or  chronic, without hemorrhage or perforation                           R63.4, Abnormal weight loss CPT copyright 2019 American [employer] Association. All rights reserved. The codes documented in this report are preliminary and upon coder review may  be revised to meet current compliance requirements. Cristopher Estimable. Jahred Tatar, MD Norvel Richards, MD 03/23/2019 10:38:49 AM This report has been signed electronically. Number of Addenda: 0

## 2019-03-23 NOTE — H&P (Signed)
@LOGO @   Primary Care Physician:  [facility] Primary Gastroenterologist:  Dr. Gala Rojas  Pre-Procedure History & Physical: HPI:  Jason Rojas is a 58 y.o. male here for colonoscopy given history of polyps and EGD for weight loss/anorexia.  Patient denies dysphagia.  Past Medical History:  Diagnosis Date  . Chronic back pain   . COPD (chronic obstructive pulmonary disease) ([place])   . Depression   . Hypertension   . PTSD (post-traumatic stress disorder)   . Sleep apnea     Past Surgical History:  Procedure Laterality Date  . APPENDECTOMY    . COLONOSCOPY     2010 and 2015, adenomas  . TONSILLECTOMY      Prior to Admission medications   Medication Sig Start Date End Date Taking? Authorizing Provider  amLODipine (NORVASC) 5 MG tablet Take 5 mg by mouth daily.    Yes [provider]  carvedilol (COREG) 25 MG tablet Take 12.5 mg by mouth 2 (two) times daily with a meal.   Yes [provider]  gabapentin (NEURONTIN) 800 MG tablet Take 800 mg by mouth 3 (three) times daily as needed (for pain/nerves).    Yes [provider]  ibuprofen (ADVIL) 600 MG tablet Take 600 mg by mouth 3 (three) times daily.    Yes [provider]  sildenafil (VIAGRA) 100 MG tablet Take 100 mg by mouth daily as needed for erectile dysfunction.   Yes [provider]    Allergies as of 11/24/2018  . (No Known Allergies)    Family History  Problem Relation Age of Onset  . Colon cancer Neg Hx     Social History   Socioeconomic History  . Marital status: Divorced    Spouse name: Not on file  . Number of children: Not on file  . Years of education: Not on file  . Highest education level: Not on file  Occupational History  . Not on file  Social Needs  . Financial resource strain: Not on file  . Food insecurity    Worry: Not on file    Inability: Not on file  . Transportation needs    Medical: Not on file    Non-medical: Not on file  Tobacco Use   . Smoking status: Current Every Day Smoker    Packs/day: 1.50    Types: Cigarettes  . Smokeless tobacco: Never Used  Substance and Sexual Activity  . Alcohol use: Yes    Comment: 4-5 beers daily  . Drug use: No    Types: Cocaine    Comment: history, none in 3-4 years   . Sexual activity: Never  Lifestyle  . Physical activity    Days per week: Not on file    Minutes per session: Not on file  . Stress: Not on file  Relationships  . Social Herbalist on phone: Not on file    Gets together: Not on file    Attends religious service: Not on file    Active member of club or organization: Not on file    Attends meetings of clubs or organizations: Not on file    Relationship status: Not on file  . Intimate partner violence    Fear of current or ex partner: Not on file    Emotionally abused: Not on file    Physically abused: Not on file    Forced sexual activity: Not on file  Other Topics Concern  . Not on file  Social History  Narrative  . Not on file    Review of Systems: See HPI, otherwise negative ROS  Physical Exam: There were no vitals taken for this visit. General:   Alert,  Well-developed, well-nourished, pleasant and cooperative in NAD Mouth:  No deformity or lesions. Neck:  Supple; no masses or thyromegaly. No significant cervical adenopathy. Lungs:  Clear throughout to auscultation.   No wheezes, crackles, or rhonchi. No acute distress. Heart:  Regular rate and rhythm; no murmurs, clicks, rubs,  or gallops. Abdomen: Non-distended, normal bowel sounds.  Soft and nontender without appreciable mass or hepatosplenomegaly.  Pulses:  Normal pulses noted. Extremities:  Without clubbing or edema.  Impression/Plan: 58 year old [name] presents for surveillance colonoscopy given history of colonic polyps on multiple occasions.  Also what weight loss/anorexia.  Colonoscopy and EGD now being done.  Patient denies dysphagia. The risks, benefits,  limitations, imponderables and alternatives regarding both EGD and colonoscopy have been reviewed with the patient. Questions have been answered. All parties agreeable.      Notice: This dictation was prepared with Dragon dictation along with smaller phrase technology. Any transcriptional errors that result from this process are unintentional and may not be corrected upon review.

## 2019-03-23 NOTE — Transfer of Care (Signed)
Immediate Anesthesia Transfer of Care Note  Patient: Jason Rojas  Procedure(s) Performed: COLONOSCOPY WITH PROPOFOL (N/A ) ESOPHAGOGASTRODUODENOSCOPY (EGD) WITH PROPOFOL (N/A ) BIOPSY POLYPECTOMY  Patient Location: PACU  Anesthesia Type:MAC  Level of Consciousness: awake, alert  and oriented  Airway & Oxygen Therapy: Patient Spontanous Breathing and Patient connected to nasal cannula oxygen  Post-op Assessment: Report given to RN, Post -op Vital signs reviewed and stable and Patient moving all extremities X 4  Post vital signs: Reviewed and stable  Last Vitals:  Vitals Value Taken Time  BP 140/76 03/23/19 1106  Temp    Pulse 65 03/23/19 1107  Resp 16 03/23/19 1107  SpO2 100 % 03/23/19 1107  Vitals shown include unvalidated device data.  Last Pain:  Vitals:   03/23/19 1024  TempSrc:   PainSc: 0-No pain         Complications: No apparent anesthesia complications

## 2019-03-23 NOTE — Anesthesia Preprocedure Evaluation (Signed)
Anesthesia Evaluation    Airway Mallampati: III       Dental  (+) Teeth Intact   Pulmonary sleep apnea , COPD, Current Smoker,     + decreased breath sounds      Cardiovascular hypertension,  Rhythm:regular     Neuro/Psych PSYCHIATRIC DISORDERS Anxiety Depression    GI/Hepatic   Endo/Other    Renal/GU      Musculoskeletal   Abdominal   Peds  Hematology   Anesthesia Other Findings COPD, inhalers, ongoing tobacco abuse 1 ppd Elevated BP today- took labetalol, "forgot the norvasc"  Reproductive/Obstetrics                             Anesthesia Physical Anesthesia Plan  ASA: III  Anesthesia Plan: MAC   Post-op Pain Management:    Induction:   PONV Risk Score and Plan:   Airway Management Planned:   Additional Equipment:   Intra-op Plan:   Post-operative Plan:   Informed Consent: I have reviewed the patients History and Physical, chart, labs and discussed the procedure including the risks, benefits and alternatives for the proposed anesthesia with the patient or authorized representative who has indicated his/her understanding and acceptance.       Plan Discussed with: Anesthesiologist  Anesthesia Plan Comments:         Anesthesia Quick Evaluation

## 2019-03-23 NOTE — Op Note (Signed)
[facility] Patient Name: Jason Rojas Procedure Date: 03/23/2019 10:36 AM MRN: [id number] Date of Birth: Apr 10, 1961 Attending MD: Norvel Richards , MD CSN: [id number] Age: 58 Admit Type: Outpatient Procedure:                Colonoscopy Indications:              High risk colon cancer surveillance: Personal                            history of colonic polyps Providers:                Norvel Richards, MD, Otis Peak B. Sharon Seller, RN,                            Raphael Gibney, Technician Referring MD:              Medicines:                Propofol per Anesthesia Complications:            No immediate complications. Estimated Blood Loss:     Estimated blood loss was minimal. Procedure:                Pre-Anesthesia Assessment:                           - Prior to the procedure, a History and Physical                            was performed, and patient medications and                            allergies were reviewed. The patient's tolerance of                            previous anesthesia was also reviewed. The risks                            and benefits of the procedure and the sedation                            options and risks were discussed with the patient.                            All questions were answered, and informed consent                            was obtained. Prior Anticoagulants: The patient has                            taken no previous anticoagulant or antiplatelet                            agents. ASA Grade Assessment: II - A patient with  mild systemic disease. After reviewing the risks                            and benefits, the patient was deemed in                            satisfactory condition to undergo the procedure.                           After obtaining informed consent, the colonoscope                            was passed under direct vision. Throughout the                            procedure, the  patient's blood pressure, pulse, and                            oxygen saturations were monitored continuously. The                            CF-HQ190L ([id number]) scope was introduced through                            the anus and advanced to the the cecum, identified                            by appendiceal orifice and ileocecal valve. The                            colonoscopy was performed without difficulty. The                            patient tolerated the procedure well. The quality                            of the bowel preparation was adequate. The                            ileocecal valve, appendiceal orifice, and rectum                            were photographed. The entire colon was well                            visualized. Scope In: 10:40:09 AM Scope Out: 10:59:13 AM Scope Withdrawal Time: 0 hours 14 minutes 32 seconds  Total Procedure Duration: 0 hours 19 minutes 4 seconds  Findings:      The perianal and digital rectal examinations were normal.      A 11 mm polyp was found in the ascending colon proximal ascending colon.       The polyp was semi-pedunculated. The polyp was removed with a hot snare.       Resection was complete, and retrieval was complete. Estimated blood  loss: none.      A 4 mm polyp was found in the cecum. The polyp was sessile. The polyp       was removed with a cold snare. Resection and retrieval were complete.       Estimated blood loss was minimal. This polyp was submitted with the       asending colon polyp.      Non-bleeding internal hemorrhoids were found during retroflexion. The       hemorrhoids were moderate and small.      The exam was otherwise without abnormality on direct and retroflexion       views. Impression:               - One 11 mm polyp in the ascending colon in the                            proximal ascending colon, removed with a hot snare.                            Resected and retrieved.                            - One 4 mm polyp in the cecum, removed with a cold                            snare. Resected and retrieved.                           - Non-bleeding internal hemorrhoids.                           - The examination was otherwise normal on direct                            and retroflexion views. Moderate Sedation:      Moderate (conscious) sedation was personally administered by an       anesthesia professional. The following parameters were monitored: oxygen       saturation, heart rate, blood pressure, respiratory rate, EKG, adequacy       of pulmonary ventilation, and response to care. Recommendation:           - Patient has a contact number available for                            emergencies. The signs and symptoms of potential                            delayed complications were discussed with the                            patient. Return to normal activities tomorrow.                            Written discharge instructions were provided to the  patient.                           - Resume previous diet.                           - Continue present medications.                           - Repeat colonoscopy date to be determined after                            pending pathology results are reviewed for                            surveillance based on pathology results.                           - Return to GI office (date not yet determined).                            See EGD report. Procedure Code(s):        --- Professional ---                           [phone], Colonoscopy, flexible; with removal of                            tumor(s), polyp(s), or other lesion(s) by snare                            technique Diagnosis Code(s):        --- Professional ---                           Z86.010, Personal history of colonic polyps                           K63.5, Polyp of colon                           K64.8, Other hemorrhoids CPT copyright 2019  American Medical Association. All rights reserved. The codes documented in this report are preliminary and upon coder review may  be revised to meet current compliance requirements. Cristopher Estimable. Crosley Stejskal, MD Norvel Richards, MD 03/23/2019 11:10:18 AM This report has been signed electronically. Number of Addenda: 0

## 2019-03-23 NOTE — Discharge Instructions (Signed)
Monitored Anesthesia Care, Care After These instructions provide you with information about caring for yourself after your procedure. Your health care provider may also give you more specific instructions. Your treatment has been planned according to current medical practices, but problems sometimes occur. Call your health care provider if you have any problems or questions after your procedure. What can I expect after the procedure? After your procedure, you may:  Feel sleepy for several hours.  Feel clumsy and have poor balance for several hours.  Feel forgetful about what happened after the procedure.  Have poor judgment for several hours.  Feel nauseous or vomit.  Have a sore throat if you had a breathing tube during the procedure. Follow these instructions at home: For at least 24 hours after the procedure:      Have a responsible adult stay with you. It is important to have someone help care for you until you are awake and alert.  Rest as needed.  Do not: ? Participate in activities in which you could fall or become injured. ? Drive. ? Use heavy machinery. ? Drink alcohol. ? Take sleeping pills or medicines that cause drowsiness. ? Make important decisions or sign legal documents. ? Take care of children on your own. Eating and drinking  Follow the diet that is recommended by your health care provider.  If you vomit, drink water, juice, or soup when you can drink without vomiting.  Make sure you have little or no nausea before eating solid foods. General instructions  Take over-the-counter and prescription medicines only as told by your health care provider.  If you have sleep apnea, surgery and certain medicines can increase your risk for breathing problems. Follow instructions from your health care provider about wearing your sleep device: ? Anytime you are sleeping, including during daytime naps. ? While taking prescription pain medicines, sleeping medicines,  or medicines that make you drowsy.  If you smoke, do not smoke without supervision.  Keep all follow-up visits as told by your health care provider. This is important. Contact a health care provider if:  You keep feeling nauseous or you keep vomiting.  You feel light-headed.  You develop a rash.  You have a fever. Get help right away if:  You have trouble breathing. Summary  For several hours after your procedure, you may feel sleepy and have poor judgment.  Have a responsible adult stay with you for at least 24 hours or until you are awake and alert. This information is not intended to replace advice given to you by your health care provider. Make sure you discuss any questions you have with your health care provider. Document Released: 01/08/2016 Document Revised: 05/03/2017 Document Reviewed: 01/08/2016 Elsevier Interactive Patient Education  2019 [employer].     EGD Discharge instructions Please read the instructions outlined below and refer to this sheet in the next few weeks. These discharge instructions provide you with general information on caring for yourself after you leave the hospital. Your doctor may also give you specific instructions. While your treatment has been planned according to the most current medical practices available, unavoidable complications occasionally occur. If you have any problems or questions after discharge, please call your doctor. ACTIVITY  You may resume your regular activity but move at a slower pace for the next 24 hours.   Take frequent rest periods for the next 24 hours.   Walking will help expel (get rid of) the air and reduce the bloated feeling in your abdomen.  No driving for 24 hours (because of the anesthesia (medicine) used during the test).   You may shower.   Do not sign any important legal documents or operate any machinery for 24 hours (because of the anesthesia used during the test).  NUTRITION  Drink plenty of  fluids.   You may resume your normal diet.   Begin with a light meal and progress to your normal diet.   Avoid alcoholic beverages for 24 hours or as instructed by your caregiver.  MEDICATIONS  You may resume your normal medications unless your caregiver tells you otherwise.  WHAT YOU CAN EXPECT TODAY  You may experience abdominal discomfort such as a feeling of fullness or gas pains.  FOLLOW-UP  Your doctor will discuss the results of your test with you.  SEEK IMMEDIATE MEDICAL ATTENTION IF ANY OF THE FOLLOWING OCCUR:  Excessive nausea (feeling sick to your stomach) and/or vomiting.   Severe abdominal pain and distention (swelling).   Trouble swallowing.   Temperature over 101 F (37.8 C).   Rectal bleeding or vomiting of blood.    Colonoscopy Discharge Instructions  Read the instructions outlined below and refer to this sheet in the next few weeks. These discharge instructions provide you with general information on caring for yourself after you leave the hospital. Your doctor may also give you specific instructions. While your treatment has been planned according to the most current medical practices available, unavoidable complications occasionally occur. If you have any problems or questions after discharge, call Dr. Gala Romney at [phone]. ACTIVITY  You may resume your regular activity, but move at a slower pace for the next 24 hours.   Take frequent rest periods for the next 24 hours.   Walking will help get rid of the air and reduce the bloated feeling in your belly (abdomen).   No driving for 24 hours (because of the medicine (anesthesia) used during the test).    Do not sign any important legal documents or operate any machinery for 24 hours (because of the anesthesia used during the test).  NUTRITION  Drink plenty of fluids.   You may resume your normal diet as instructed by your doctor.   Begin with a light meal and progress to your normal diet. Heavy or  fried foods are harder to digest and may make you feel sick to your stomach (nauseated).   Avoid alcoholic beverages for 24 hours or as instructed.  MEDICATIONS  You may resume your normal medications unless your doctor tells you otherwise.  WHAT YOU CAN EXPECT TODAY  Some feelings of bloating in the abdomen.   Passage of more gas than usual.   Spotting of blood in your stool or on the toilet paper.  IF YOU HAD POLYPS REMOVED DURING THE COLONOSCOPY:  No aspirin products for 7 days or as instructed.   No alcohol for 7 days or as instructed.   Eat a soft diet for the next 24 hours.  FINDING OUT THE RESULTS OF YOUR TEST Not all test results are available during your visit. If your test results are not back during the visit, make an appointment with your caregiver to find out the results. Do not assume everything is normal if you have not heard from your caregiver or the medical facility. It is important for you to follow up on all of your test results.  SEEK IMMEDIATE MEDICAL ATTENTION IF:  You have more than a spotting of blood in your stool.   Your belly is  swollen (abdominal distention).   You are nauseated or vomiting.   You have a temperature over 101.   You have abdominal pain or discomfort that is severe or gets worse throughout the day.    Colon polyp information provided  Further recommendations to follow pending review of pathology report  Called [phone] to speak to Ms. Lady Deutscher but got no answer.

## 2019-03-26 ENCOUNTER — Encounter (HOSPITAL_COMMUNITY): Payer: Self-pay | Admitting: Internal Medicine

## 2019-03-26 ENCOUNTER — Encounter: Payer: Self-pay | Admitting: Internal Medicine

## 2020-04-17 ENCOUNTER — Encounter (HOSPITAL_COMMUNITY): Payer: Self-pay | Admitting: *Deleted

## 2020-04-17 ENCOUNTER — Other Ambulatory Visit: Payer: Self-pay

## 2020-04-17 ENCOUNTER — Emergency Department (HOSPITAL_COMMUNITY)
Admission: EM | Admit: 2020-04-17 | Discharge: 2020-04-17 | Disposition: A | Discharge status: Home / Self Care | Payer: No Typology Code available for payment source | Attending: Emergency Medicine | Admitting: Emergency Medicine

## 2020-04-17 DIAGNOSIS — Z79899 Other long term (current) drug therapy: Secondary | ICD-10-CM | POA: Diagnosis not present

## 2020-04-17 DIAGNOSIS — J449 Chronic obstructive pulmonary disease, unspecified: Secondary | ICD-10-CM | POA: Diagnosis not present

## 2020-04-17 DIAGNOSIS — I10 Essential (primary) hypertension: Secondary | ICD-10-CM | POA: Insufficient documentation

## 2020-04-17 DIAGNOSIS — F17210 Nicotine dependence, cigarettes, uncomplicated: Secondary | ICD-10-CM | POA: Insufficient documentation

## 2020-04-17 DIAGNOSIS — R21 Rash and other nonspecific skin eruption: Secondary | ICD-10-CM | POA: Insufficient documentation

## 2020-04-17 MED ORDER — PREDNISONE 20 MG PO TABS
40.0000 mg | ORAL_TABLET | Freq: Once | ORAL | Status: AC
  Administered 2020-04-17: 40 mg via ORAL
  Filled 2020-04-17: qty 2

## 2020-04-17 MED ORDER — PREDNISONE 10 MG PO TABS: ORAL_TABLET | ORAL | 0 refills | Status: DC

## 2020-04-17 MED ORDER — DIPHENHYDRAMINE HCL 25 MG PO CAPS
25.0000 mg | ORAL_CAPSULE | Freq: Once | ORAL | Status: AC
  Administered 2020-04-17: 25 mg via ORAL
  Filled 2020-04-17: qty 1

## 2020-04-17 MED ORDER — DIPHENHYDRAMINE HCL 25 MG PO TABS: 25.0000 mg | ORAL_TABLET | Freq: Four times a day (QID) | ORAL | 0 refills | Status: DC | PRN

## 2020-04-17 NOTE — ED Triage Notes (Signed)
Patient presents to the ED with generalized itching rash since this morning.  Patient does not report shortness of breath.

## 2020-04-17 NOTE — ED Provider Notes (Signed)
[facility] Provider Note   CSN: [id number] Arrival date & time: 04/17/20  1404     History Chief Complaint  Patient presents with  . Rash    Jason Rojas is a 59 y.o. male with a history of COPD, hypertension, PTSD, & sleep apnea who presents to the ED with complaints of generalized rash to his entire body- worse to upper/lower extremities that began this AM.  He states the rash is very pruritic, it is not painful.  There are no alleviating or aggravating factors.  No intervention prior to arrival.  He states that yesterday he did go to a watermelon patch to pick some watermelons, nothing else atypical for the past few days.  He denies new products, medications, or foods.  He denies dyspnea, wheezing, sensation of throat closing, trouble swallowing, fever, or chills. HPI     Past Medical History:  Diagnosis Date  . Chronic back pain   . COPD (chronic obstructive pulmonary disease) ([place])   . Depression   . Hypertension   . PTSD (post-traumatic stress disorder)   . Sleep apnea     Patient Active Problem List   Diagnosis Date Noted  . History of colonic polyps 11/24/2018  . Loss of weight 11/24/2018    Past Surgical History:  Procedure Laterality Date  . APPENDECTOMY    . BIOPSY  03/23/2019   Procedure: BIOPSY;  Surgeon: Daneil Dolin, MD;  Location: [facility];  Service: Endoscopy;;  gastric  . COLONOSCOPY     2010 and 2015, adenomas  . COLONOSCOPY WITH PROPOFOL N/A 03/23/2019   Procedure: COLONOSCOPY WITH PROPOFOL;  Surgeon: Daneil Dolin, MD;  Location: [facility];  Service: Endoscopy;  Laterality: N/A;  1:30pm  . ESOPHAGOGASTRODUODENOSCOPY (EGD) WITH PROPOFOL N/A 03/23/2019   Procedure: ESOPHAGOGASTRODUODENOSCOPY (EGD) WITH PROPOFOL;  Surgeon: Daneil Dolin, MD;  Location: [facility];  Service: Endoscopy;  Laterality: N/A;  . POLYPECTOMY  03/23/2019   Procedure: POLYPECTOMY;  Surgeon: Daneil Dolin, MD;  Location: [facility];   Service: Endoscopy;;  colon  . TONSILLECTOMY         Family History  Problem Relation Age of Onset  . Colon cancer Neg Hx     Social History   Tobacco Use  . Smoking status: Current Every Day Smoker    Packs/day: 1.50    Types: Cigarettes  . Smokeless tobacco: Never Used  Substance Use Topics  . Alcohol use: Yes    Comment: 4-5 beers daily  . Drug use: No    Types: Cocaine    Comment: history, none in 3-4 years     Home Medications Prior to Admission medications   Medication Sig Start Date End Date Taking? Authorizing Provider  amLODipine (NORVASC) 5 MG tablet Take 5 mg by mouth daily.     [provider]  carvedilol (COREG) 25 MG tablet Take 12.5 mg by mouth 2 (two) times daily with a meal.    [provider]  gabapentin (NEURONTIN) 800 MG tablet Take 800 mg by mouth 3 (three) times daily as needed (for pain/nerves).     [provider]  ibuprofen (ADVIL) 600 MG tablet Take 600 mg by mouth 3 (three) times daily.     [provider]  sildenafil (VIAGRA) 100 MG tablet Take 100 mg by mouth daily as needed for erectile dysfunction.    [provider]    Allergies    Patient has no known allergies.  Review of Systems   Review of Systems  Constitutional: Negative for chills and fever.  HENT: Negative for trouble swallowing and voice change.   Respiratory: Negative for cough and shortness of breath.   Cardiovascular: Negative for chest pain.  Gastrointestinal: Negative for abdominal pain, nausea and vomiting.  Skin: Positive for rash.  All other systems reviewed and are negative.  Physical Exam Updated Vital Signs BP 137/79 (BP Location: Right Arm)   Pulse 98   Temp 98.8 F (37.1 C) (Oral)   Resp 16   Ht 5\' 6"  (1.676 m)   Wt 72.6 kg   SpO2 98%   BMI 25.82 kg/m   Physical Exam Vitals and nursing note reviewed.  Constitutional:      General: He is not in acute distress.    Appearance: He is well-developed. He is  not toxic-appearing.  HENT:     Head: Normocephalic and atraumatic.     Mouth/Throat:     Pharynx: Uvula midline.     Comments: No signs of angioedema.  Posterior oropharynx is symmetric appearing. Patient tolerating own secretions without difficulty. No trismus. No drooling. No hot potato voice. No swelling beneath the tongue, submandibular compartment is soft.   Eyes:     General:        Right eye: No discharge.        Left eye: No discharge.     Conjunctiva/sclera: Conjunctivae normal.  Cardiovascular:     Rate and Rhythm: Normal rate and regular rhythm.  Pulmonary:     Effort: Pulmonary effort is normal. No respiratory distress.     Breath sounds: Normal breath sounds. No wheezing, rhonchi or rales.  Abdominal:     General: There is no distension.     Palpations: Abdomen is soft.     Tenderness: There is no abdominal tenderness.  Musculoskeletal:     Cervical back: Neck supple.  Skin:    General: Skin is warm and dry.     Findings: Rash present.     Comments: Maculopapular rash to the extremities x 4 and very minimally to the truck. Excoriations present. Some portions of rash appear in linear pattern. No palm/sole/mucous membrane involvement. No pustules, petechiae, or urticaria noted.  Neurological:     Mental Status: He is alert.     Comments: Clear speech.   Psychiatric:        Behavior: Behavior normal.    ED Results / Procedures / Treatments   Labs (all labs ordered are listed, but only abnormal results are displayed) Labs Reviewed - No data to display  EKG None  Radiology No results found.  Procedures Procedures (including critical care time)  Medications Ordered in ED Medications - No data to display  ED Course  I have reviewed the triage vital signs and the nursing notes.  Pertinent labs & imaging results that were available during my care of the patient were reviewed by me and considered in my medical decision making (see chart for details).     MDM Rules/Calculators/A&P                         Patient presents to the ED with complaint of rash. Patient denies any difficulty breathing or swallowing, he has a patent airway without stridor and is handling secretions without difficulty; no angioedema. No blisters, no pustules, no warmth, no draining sinus tracts, no superficial abscesses, no bullous impetigo, no vesicles, no desquamation, no target lesions with dusky purpura  or a central bulla. Not tender to touch. No concern for superimposed infection. No concern for SJS, TEN, TSS, tick borne illness, syphilis or other life-threatening condition. Suspect contact dermatitis, with his linear distribution of rash in certain areas possibly poison ivy. Will treat with steroids & benadryl with PCP and or dermatology follow up I discussed results, treatment plan, need for follow-up, and return precautions with the patient. Provided opportunity for questions, patient confirmed understanding and is in agreement with plan.   Final Clinical Impression(s) / ED Diagnoses Final diagnoses:  Rash    Rx / DC Orders ED Discharge Orders         Ordered    predniSONE (DELTASONE) 10 MG tablet     Discontinue  Reprint     04/17/20 1808    diphenhydrAMINE (BENADRYL) 25 MG tablet  Every 6 hours PRN     Discontinue  Reprint     04/17/20 1808           Braniya Farrugia, Glynda Jaeger, PA-C 04/17/20 1810    Daleen Bo, MD 04/18/20 1143

## 2020-04-17 NOTE — Discharge Instructions (Addendum)
You were seen in the emergency department today for a rash which we suspect could be a contact dermatitis.  We are sending you home with a steroid to take over the next 10 days please take this as prescribed.  We are sending you with Benadryl to take every 6 hours as needed for itching.  We have prescribed you new medication(s) today. Discuss the medications prescribed today with your pharmacist as they can have adverse effects and interactions with your other medicines including over the counter and prescribed medications. Seek medical evaluation if you start to experience new or abnormal symptoms after taking one of these medicines, seek care immediately if you start to experience difficulty breathing, feeling of your throat closing, facial swelling, or rash as these could be indications of a more serious allergic reaction  Please follow-up department provider or with a dermatologist within 3 to 5 days for reevaluation.  Return to the emergency department for new or worsening symptoms including but not limited to worsening rash, pain to rash area, involvement of your mouth/lip/tongue, involvement of your palm/soles, fever, pus draining from the rash, or any other concerns.  [facility] Dermatologists:   [facility]  3.2 [phone])  Dermatologist  [place] [address] [place]  [phone]   Dr. Michelene Gardener, MD  2.6 [phone])  Dermatologist  [place]  [phone]  [facility]  3.5 (3)  [facility]  [place]  [phone]   [place]  4.0 (4)  Dermatologist  [place]  [phone]  Lavonna Monarch MD  3.0 (2)  Dermatologist  [place]  [phone]  Katrina Stack  2.7 (6)  Dermatologist  [place]  [phone]  [place] Amy Y MD  2.0 (1)  Dermatologist  [place]  [phone]  [place]  5.0 (3)  Doctor  [address]  [phone]

## 2020-08-02 ENCOUNTER — Encounter (HOSPITAL_COMMUNITY): Payer: Self-pay | Admitting: Emergency Medicine

## 2020-08-02 ENCOUNTER — Emergency Department (HOSPITAL_COMMUNITY)
Admission: EM | Admit: 2020-08-02 | Discharge: 2020-08-02 | Disposition: A | Discharge status: Home / Self Care | Payer: No Typology Code available for payment source | Attending: Emergency Medicine | Admitting: Emergency Medicine

## 2020-08-02 ENCOUNTER — Other Ambulatory Visit: Payer: Self-pay

## 2020-08-02 DIAGNOSIS — F17210 Nicotine dependence, cigarettes, uncomplicated: Secondary | ICD-10-CM | POA: Insufficient documentation

## 2020-08-02 DIAGNOSIS — J449 Chronic obstructive pulmonary disease, unspecified: Secondary | ICD-10-CM | POA: Insufficient documentation

## 2020-08-02 DIAGNOSIS — I10 Essential (primary) hypertension: Secondary | ICD-10-CM | POA: Insufficient documentation

## 2020-08-02 DIAGNOSIS — R040 Epistaxis: Secondary | ICD-10-CM | POA: Diagnosis present

## 2020-08-02 MED ORDER — SILVER NITRATE-POT NITRATE 75-25 % EX MISC
2.0000 | Freq: Once | CUTANEOUS | Status: AC
  Administered 2020-08-02: 2 via TOPICAL

## 2020-08-02 NOTE — ED Triage Notes (Signed)
Pt states his nose started bleeding around 1pm today and then again around 3pm,

## 2020-08-02 NOTE — ED Provider Notes (Signed)
[facility] EMERGENCY DEPARTMENT Provider Note   CSN: [id number] Arrival date & time: 08/02/20  1833     History Chief Complaint  Patient presents with  . Epistaxis    Jason Rojas is a 59 y.o. male.  HPI   Patient with significant medical history of hypertension, alcohol abuse, sleep apnea, nosebleeds presents to the emergency department with chief complaint of nosebleed.  Patient states he has had a nosebleed on and off today starting at 1 PM.  He is states he was able to get it to stop after he applied cotton balls up his nose but after he took them out his nose started to bleed again.  He was able to get a stop  in the waiting room but wanted to be evaluated.  He denies recent trauma, denies bleeding disorders, but endorses that he has had nosebleeds in the past.  He also endorses that he drinks regularly drinks 3 to 4 40 ounce beers daily.  After reviewing patient's chart he had a nosebleed 5 years ago and had to get balloons placed in.  He has had no other incidence of nosebleeds.  Patient denies headaches, fever, chills, short of breath, chest pain, dumping, nausea, vomiting, diarrhea.  Past Medical History:  Diagnosis Date  . Chronic back pain   . COPD (chronic obstructive pulmonary disease) ([place])   . Depression   . Hypertension   . PTSD (post-traumatic stress disorder)   . Sleep apnea     Patient Active Problem List   Diagnosis Date Noted  . History of colonic polyps 11/24/2018  . Loss of weight 11/24/2018    Past Surgical History:  Procedure Laterality Date  . APPENDECTOMY    . BIOPSY  03/23/2019   Procedure: BIOPSY;  Surgeon: Daneil Dolin, MD;  [facility]: [facility];  Service: Endoscopy;;  gastric  . COLONOSCOPY     2010 and 2015, adenomas  . COLONOSCOPY WITH PROPOFOL N/A 03/23/2019   Procedure: COLONOSCOPY WITH PROPOFOL;  Surgeon: Daneil Dolin, MD;  Location: [facility];  Service: Endoscopy;  Laterality: N/A;  1:30pm  . ESOPHAGOGASTRODUODENOSCOPY  (EGD) WITH PROPOFOL N/A 03/23/2019   Procedure: ESOPHAGOGASTRODUODENOSCOPY (EGD) WITH PROPOFOL;  Surgeon: Daneil Dolin, MD;  [facility]: [facility];  Service: Endoscopy;  Laterality: N/A;  . POLYPECTOMY  03/23/2019   Procedure: POLYPECTOMY;  Surgeon: Daneil Dolin, MD;  Location: [facility];  Service: Endoscopy;;  colon  . TONSILLECTOMY         Family History  Problem Relation Age of Onset  . Colon cancer Neg Hx     Social History   Tobacco Use  . Smoking status: Current Every Day Smoker    Packs/day: 1.50    Types: Cigarettes  . Smokeless tobacco: Never Used  Substance Use Topics  . Alcohol use: Yes    Comment: 4-5 beers daily  . Drug use: No    Types: Cocaine    Comment: history, none in 3-4 years     Home Medications Prior to Admission medications   Medication Sig Start Date End Date Taking? Authorizing Provider  amLODipine (NORVASC) 5 MG tablet Take 5 mg by mouth daily.     [provider]  carvedilol (COREG) 25 MG tablet Take 12.5 mg by mouth 2 (two) times daily with a meal.    [provider]  diphenhydrAMINE (BENADRYL) 25 MG tablet Take 1 tablet (25 mg total) by mouth every 6 (six) hours as needed. 04/17/20  Petrucelli, Samantha R, PA-C  gabapentin (NEURONTIN) 800 MG tablet Take 800 mg by mouth 3 (three) times daily as needed (for pain/nerves).     [provider]  ibuprofen (ADVIL) 600 MG tablet Take 600 mg by mouth 3 (three) times daily.     [provider]  predniSONE (DELTASONE) 10 MG tablet Take 40 mg (4 tablets) for the first 5 days then 20 mg (2 tablets) for 5 days. 04/17/20   Petrucelli, Samantha R, PA-C  sildenafil (VIAGRA) 100 MG tablet Take 100 mg by mouth daily as needed for erectile dysfunction.    [provider]    Allergies    Patient has no known allergies.  Review of Systems   Review of Systems  Constitutional: Negative for chills and fever.  HENT: Positive for nosebleeds and postnasal drip.  Negative for congestion, tinnitus, trouble swallowing and voice change.   Respiratory: Negative for shortness of breath.   Cardiovascular: Negative for chest pain.  Gastrointestinal: Negative for abdominal pain.  Genitourinary: Negative for enuresis.  Musculoskeletal: Negative for back pain.  Skin: Negative for rash.  Neurological: Negative for dizziness.  Hematological: Does not bruise/bleed easily.    Physical Exam Updated Vital Signs BP (!) 158/97 (BP Location: Right Arm)   Pulse 97   Temp 98.4 F (36.9 C) (Oral)   Resp 20   Ht 5\' 7"  (1.702 m)   Wt 74.8 kg   SpO2 98%   BMI 25.84 kg/m   Physical Exam Vitals and nursing note reviewed.  Constitutional:      General: He is not in acute distress.    Appearance: Normal appearance. He is not ill-appearing or diaphoretic.  HENT:     Head: Normocephalic and atraumatic.     Nose: No congestion or rhinorrhea.     Comments: Patient's nose was visualized it was not actively bleeding,  he had dried blood on the exterior aspect of his nares.  His turbinates were red and erythematous and there was some slight blood noted in the posterior aspect of his turbinates.    Mouth/Throat:     Pharynx: No oropharyngeal exudate or posterior oropharyngeal erythema.  Eyes:     General: No scleral icterus.       Right eye: No discharge.        Left eye: No discharge.     Conjunctiva/sclera: Conjunctivae normal.  Cardiovascular:     Rate and Rhythm: Normal rate and regular rhythm.  Pulmonary:     Effort: Pulmonary effort is normal.     Breath sounds: Normal breath sounds.  Musculoskeletal:     Cervical back: Neck supple.     Right lower leg: No edema.     Left lower leg: No edema.  Skin:    General: Skin is warm and dry.     Coloration: Skin is not jaundiced or pale.  Neurological:     Mental Status: He is alert and oriented to person, place, and time.  Psychiatric:        Mood and Affect: Mood normal.     ED Results / Procedures /  Treatments   Labs (all labs ordered are listed, but only abnormal results are displayed) Labs Reviewed - No data to display  EKG None  Radiology No results found.  Procedures .Epistaxis Management  Date/Time: 08/02/2020 9:46 PM Performed by: Marcello Fennel, PA-C Authorized by: Marcello Fennel, PA-C   Consent:    Consent obtained:  Verbal   Consent given by:  Patient   Risks discussed:  Bleeding, infection, nasal injury and pain   Alternatives discussed:  No treatment Anesthesia (see MAR for exact dosages):    Anesthesia method:  None Procedure details:    Treatment site:  R posterior   Treatment method:  Silver nitrate   Treatment complexity:  Limited   Treatment episode: initial   Post-procedure details:    Assessment:  Bleeding stopped   Patient tolerance of procedure:  Tolerated well, no immediate complications   (including critical care time)  Medications Ordered in ED Medications  silver nitrate applicators applicator 2 Stick (has no administration in time range)    ED Course  I have reviewed the triage vital signs and the nursing notes.  Pertinent labs & imaging results that were available during my care of the patient were reviewed by me and considered in my medical decision making (see chart for details).    MDM Rules/Calculators/A&P                          Patient presents with nosebleeds.  He is alert, did not appear in acute distress, vital signs reassuring, nose was not actively bleeding.  Due to well-appearing patient, benign physical exam further lab work and imaging were warranted at this time.  After evaluating patient's nostrils he had noted blood in the posterior aspect of his right nare.  Recommended silver nitrate to stop slow bleeding.  Patient was amendable and tolerated the procedure well.  No nosebleeding after procedure.  I have low suspicion for anemia as patient states bleeding occurred today, denies chest pain, shortness of  breath, feeling fatigued, vital signs reassuring.  Low suspicion for bleeding disorder as patient denies gum bleeding, no petechia or ecchymosis noted during exam.  Low suspicion for trauma as patient denies recent trauma to the area, no gross deformities noted on exam.  I suspect nosebleeds may be secondary to alcohol use or possiblely due to cool dry weather.  Will provide patient with information how to stop nosebleeds and to follow-up with ENT for further evaluation.  Vital signs have remained stable, no indication for hospital admission.  Patient discussed with attending and they agreed with assessment and plan.  Patient given at home care as well strict return precautions.  Patient verbalized that they understood agreed to said plan.      Final Clinical Impression(s) / ED Diagnoses Final diagnoses:  Epistaxis    Rx / DC Orders ED Discharge Orders    None       Aron Baba 08/02/20 2153    Isla Pence, MD 08/02/20 2308

## 2020-08-02 NOTE — Discharge Instructions (Addendum)
Seen here for a nosebleed.  Not actively bleeding.  if bleeding occurs,  pinch on your nose and hold it for 10 minutes.  Please repeat if bleeding has not stopped.  If you are unable to stop the bleeding after 3 attempts of holding your nose for 10 minutes.  I recommend coming back to emergency department for further evaluation management.  Please refrain from blowing your nose and try to sit in areas with warm moist air as this can help with nosebleeds.  I recommend following up with ENT doctor for further evaluation management.  Come back to the emergency department if you develop chest pain, shortness of breath, severe abdominal pain, uncontrolled nausea, vomiting, diarrhea.

## 2021-01-19 ENCOUNTER — Encounter (HOSPITAL_COMMUNITY): Payer: Self-pay | Admitting: *Deleted

## 2021-01-19 ENCOUNTER — Emergency Department (HOSPITAL_COMMUNITY)
Admission: EM | Admit: 2021-01-19 | Discharge: 2021-01-19 | Disposition: A | Discharge status: Home / Self Care | Payer: No Typology Code available for payment source | Attending: Emergency Medicine | Admitting: Emergency Medicine

## 2021-01-19 ENCOUNTER — Other Ambulatory Visit: Payer: Self-pay

## 2021-01-19 DIAGNOSIS — I10 Essential (primary) hypertension: Secondary | ICD-10-CM | POA: Diagnosis not present

## 2021-01-19 DIAGNOSIS — F17210 Nicotine dependence, cigarettes, uncomplicated: Secondary | ICD-10-CM | POA: Insufficient documentation

## 2021-01-19 DIAGNOSIS — Z79899 Other long term (current) drug therapy: Secondary | ICD-10-CM | POA: Diagnosis not present

## 2021-01-19 DIAGNOSIS — W540XXA Bitten by dog, initial encounter: Secondary | ICD-10-CM | POA: Insufficient documentation

## 2021-01-19 DIAGNOSIS — Z23 Encounter for immunization: Secondary | ICD-10-CM | POA: Diagnosis not present

## 2021-01-19 DIAGNOSIS — S81859A Open bite, unspecified lower leg, initial encounter: Secondary | ICD-10-CM | POA: Diagnosis present

## 2021-01-19 DIAGNOSIS — J449 Chronic obstructive pulmonary disease, unspecified: Secondary | ICD-10-CM | POA: Diagnosis not present

## 2021-01-19 DIAGNOSIS — T148XXA Other injury of unspecified body region, initial encounter: Secondary | ICD-10-CM

## 2021-01-19 MED ORDER — TETANUS-DIPHTH-ACELL PERTUSSIS 5-2.5-18.5 LF-MCG/0.5 IM SUSY
0.5000 mL | PREFILLED_SYRINGE | Freq: Once | INTRAMUSCULAR | Status: AC
  Administered 2021-01-19: 0.5 mL via INTRAMUSCULAR
  Filled 2021-01-19: qty 0.5

## 2021-01-19 NOTE — ED Provider Notes (Signed)
[facility] EMERGENCY DEPARTMENT Provider Note   CSN: [id number] Arrival date & time: 01/19/21  1006     History Chief Complaint  Patient presents with  . Animal Bite    Jason Rojas is a 60 y.o. male presenting for evaluation after an animal bite.   Patient states 2 days ago he was bitten by his neighbor's dog on his leg.  He cannot recall which leg, neither one hurts now.  He is encouraged by his family to come into the ER to get a tetanus shot.  He does not know when his last 1 was, but thinks he has been more than 10 years.  [facility]  He denies bleeding, swelling, or pain in either leg.  Additional history obtained from chart review.  History of chronic pain, COPD, depression, hypertension, PTSD, sleep apnea.  HPI     Past Medical History:  Diagnosis Date  . Chronic back pain   . COPD (chronic obstructive pulmonary disease) ([place])   . Depression   . Hypertension   . PTSD (post-traumatic stress disorder)   . Sleep apnea     Patient Active Problem List   Diagnosis Date Noted  . History of colonic polyps 11/24/2018  . Loss of weight 11/24/2018    Past Surgical History:  Procedure Laterality Date  . APPENDECTOMY    . BIOPSY  03/23/2019   Procedure: BIOPSY;  Surgeon: Daneil Dolin, MD;  [facility];  Service: Endoscopy;;  gastric  . COLONOSCOPY     2010 and 2015, adenomas  . COLONOSCOPY WITH PROPOFOL N/A 03/23/2019   Procedure: COLONOSCOPY WITH PROPOFOL;  Surgeon: Daneil Dolin, MD;  [facility];  Service: Endoscopy;  Laterality: N/A;  1:30pm  . ESOPHAGOGASTRODUODENOSCOPY (EGD) WITH PROPOFOL N/A 03/23/2019   Procedure: ESOPHAGOGASTRODUODENOSCOPY (EGD) WITH PROPOFOL;  Surgeon: Daneil Dolin, MD;  [facility];  Service: Endoscopy;  Laterality: N/A;  . POLYPECTOMY  03/23/2019   Procedure: POLYPECTOMY;  Surgeon: Daneil Dolin, MD;  [facility];  Service: Endoscopy;;  colon  . TONSILLECTOMY          Family History  Problem Relation Age of Onset  . Colon cancer Neg Hx     Social History   Tobacco Use  . Smoking status: Current Every Day Smoker    Packs/day: 1.50    Types: Cigarettes  . Smokeless tobacco: Never Used  Substance Use Topics  . Alcohol use: Yes    Comment: 4-5 beers daily  . Drug use: No    Types: Cocaine    Comment: history, none in 3-4 years     Home Medications Prior to Admission medications   Medication Sig Start Date End Date Taking? Authorizing Provider  amLODipine (NORVASC) 5 MG tablet Take 5 mg by mouth daily.     [provider]  carvedilol (COREG) 25 MG tablet Take 12.5 mg by mouth 2 (two) times daily with a meal.    [provider]  diphenhydrAMINE (BENADRYL) 25 MG tablet Take 1 tablet (25 mg total) by mouth every 6 (six) hours as needed. 04/17/20   Petrucelli, Samantha R, PA-C  gabapentin (NEURONTIN) 800 MG tablet Take 800 mg by mouth 3 (three) times daily as needed (for pain/nerves).     [provider]  ibuprofen (ADVIL) 600 MG tablet Take 600 mg by mouth 3 (three) times daily.     [provider]  predniSONE (DELTASONE) 10  MG tablet Take 40 mg (4 tablets) for the first 5 days then 20 mg (2 tablets) for 5 days. 04/17/20   Petrucelli, Samantha R, PA-C  sildenafil (VIAGRA) 100 MG tablet Take 100 mg by mouth daily as needed for erectile dysfunction.    [provider]    Allergies    Patient has no known allergies.  Review of Systems   Review of Systems  Musculoskeletal: Negative for myalgias.  Skin: Wound: possible wound.    Physical Exam Updated Vital Signs BP (!) 157/90   Pulse 85   Temp 98.8 F (37.1 C) (Oral)   Resp 16   Ht 5\' 7"  (1.702 m)   Wt 77.1 kg   SpO2 99%   BMI 26.63 kg/m   Physical Exam Vitals and nursing note reviewed.  Constitutional:      General: He is not in acute distress.    Appearance: He is well-developed.     Comments: Sitting in the chair in NAD  HENT:      Head: Normocephalic and atraumatic.  Pulmonary:     Effort: Pulmonary effort is normal.  Abdominal:     General: There is no distension.  Musculoskeletal:        General: Normal range of motion.     Cervical back: Normal range of motion.  Skin:    General: Skin is warm.     Capillary Refill: Capillary refill takes less than 2 seconds.     Findings: No rash.     Comments: Several old superficial scrapes most consistent with branches scraping the leg. No clear puncture wound or animal bite. No tenderness palpation of the lower legs.  No erythema, warmth, induration, or signs of bleeding or drainage.  Neurological:     Mental Status: He is alert and oriented to person, place, and time.     ED Results / Procedures / Treatments   Labs (all labs ordered are listed, but only abnormal results are displayed) Labs Reviewed - No data to display  EKG None  Radiology No results found.  Procedures Procedures   Medications Ordered in ED Medications  Tdap (BOOSTRIX) injection 0.5 mL (0.5 mLs Intramuscular Given 01/19/21 1111)    ED Course  I have reviewed the triage vital signs and the nursing notes.  Pertinent labs & imaging results that were available during my care of the patient were reviewed by me and considered in my medical decision making (see chart for details).    MDM Rules/Calculators/A&P                          Patient presenting for evaluation of possible dog bite.  On exam, patient is not toxic.  He does have some superficial scrapes, however these do not appear to be from an animal bite.  However in the setting of a possible animal bite and an outdated tetanus vaccination status, will update tdap today.  As animal is known, will notify animal control and they can monitor for rabies/evaluate if animal has its rabies shots.  I do not believe patient needs rabies shots given in the ED today.  As there is no obvious wound or signs of infection, I do not believe  antibiotics would be beneficial, however I discussed close monitoring for signs of infection.  At this time, patient appears safe for discharge.  Return precautions given.  Patient states he understands and agrees to plan.  Final Clinical Impression(s) / ED Diagnoses Final  diagnoses:  Animal bite  Tetanus-diphtheria vaccination administered at current visit    Rx / DC Orders ED Discharge Orders    None       Franchot Heidelberg, PA-C 01/19/21 1120    Daleen Bo, MD 01/20/21 [phone]

## 2021-01-19 NOTE — ED Notes (Signed)
Call to animal control to notify them of what pt reported to [place], advised that no bite marks were visible.

## 2021-01-19 NOTE — Discharge Instructions (Signed)
Your tetanus shot was updated today in the ER. Animal control has been contacted so they can monitor the dog.  The dog does not get sick, you do not need to do anything else. If the dog becomes ill, you may need to return for rabies vaccines. Follow-up with your primary care doctor as needed for further evaluation of your symptoms.  If you develop redness/pain or signs of infection of the skin of your leg, return to the emergency room immediately for antibiotics. Return with any new, worsening, concerning symptoms

## 2021-01-19 NOTE — ED Triage Notes (Signed)
States he was bitten by a dog 2 days ago and states he does not know which leg he was bitten on, states he would like a tetanus shot

## 2022-02-05 ENCOUNTER — Encounter: Payer: Self-pay | Admitting: *Deleted

## 2022-06-29 ENCOUNTER — Other Ambulatory Visit (HOSPITAL_COMMUNITY): Payer: Self-pay | Admitting: Internal Medicine

## 2022-06-29 DIAGNOSIS — C61 Malignant neoplasm of prostate: Secondary | ICD-10-CM

## 2022-07-12 ENCOUNTER — Ambulatory Visit (HOSPITAL_COMMUNITY): Admission: RE | Admit: 2022-07-12 | Payer: No Typology Code available for payment source | Source: Ambulatory Visit

## 2022-07-12 ENCOUNTER — Encounter (HOSPITAL_COMMUNITY): Payer: Self-pay

## 2022-07-23 ENCOUNTER — Ambulatory Visit (HOSPITAL_COMMUNITY)
Admission: RE | Admit: 2022-07-23 | Discharge: 2022-07-23 | Disposition: A | Discharge status: Home / Self Care | Payer: No Typology Code available for payment source | Source: Ambulatory Visit | Attending: Internal Medicine | Admitting: Internal Medicine

## 2022-07-23 DIAGNOSIS — C61 Malignant neoplasm of prostate: Secondary | ICD-10-CM | POA: Diagnosis present

## 2022-07-23 MED ORDER — PIFLIFOLASTAT F 18 (PYLARIFY) INJECTION
9.0000 | Freq: Once | INTRAVENOUS | Status: AC
  Administered 2022-07-23: 9.6 via INTRAVENOUS

## 2022-08-08 ENCOUNTER — Encounter: Payer: Self-pay | Admitting: Radiation Oncology

## 2022-08-09 ENCOUNTER — Other Ambulatory Visit (HOSPITAL_COMMUNITY): Payer: Self-pay | Admitting: Radiation Oncology

## 2022-08-09 DIAGNOSIS — N5082 Scrotal pain: Secondary | ICD-10-CM

## 2022-08-20 ENCOUNTER — Ambulatory Visit (HOSPITAL_COMMUNITY)
Admission: RE | Admit: 2022-08-20 | Discharge: 2022-08-20 | Disposition: A | Discharge status: Home / Self Care | Payer: No Typology Code available for payment source | Source: Ambulatory Visit | Attending: Radiation Oncology | Admitting: Radiation Oncology

## 2022-08-20 DIAGNOSIS — N5082 Scrotal pain: Secondary | ICD-10-CM | POA: Diagnosis present

## 2022-08-22 ENCOUNTER — Other Ambulatory Visit: Payer: Self-pay | Admitting: Radiation Oncology

## 2022-08-22 ENCOUNTER — Ambulatory Visit
Admission: RE | Admit: 2022-08-22 | Discharge: 2022-08-22 | Disposition: A | Discharge status: Home / Self Care | Payer: Self-pay | Source: Ambulatory Visit | Attending: Radiation Oncology | Admitting: Radiation Oncology

## 2022-08-22 DIAGNOSIS — C61 Malignant neoplasm of prostate: Secondary | ICD-10-CM

## 2022-08-29 NOTE — Progress Notes (Signed)
Histology and Location of Primary Cancer: Metastatic Prostate Ca  Gleason Score (3+4=7), PSA 34.4 as of 04/30/2022.  Sites of Visceral and Bony Metastatic Disease: Right tenth rib lesion  Location(s) of Symptomatic Metastases: Right tenth rib   Biopsy Report:    07/23/2022 Dr. Gardenia Phlegm NM PET ([facility]) Skull To Mid Thigh CLINICAL DATA:  Prostate carcinoma with biochemical recurrence  FINDINGS: NECK No radiotracer activity in neck lymph nodes.   Incidental CT finding: None.   CHEST No radiotracer accumulation within mediastinal or hilar lymph nodes.  No suspicious pulmonary nodules on the CT scan.   Incidental CT finding: None.   ABDOMEN/PELVIS Prostate: Focal activity in the posterior LEFT aspect of the prostate gland with SUV max equal 9.3.   Lymph nodes: RIGHT external iliac lymph node measures 8 mm (173/CT series 4). There is intense radiotracer activity at this level with SUV max equal 11.1; however, the activity is favored to be within the adjacent RIGHT ureter (image 170 fused data set) rather than within the lymph node.   No periaortic retroperitoneal adenopathy.   Liver: No evidence of liver metastasis.   Incidental CT finding: None.   SKELETON Focus radiotracer activity at the junction of the RIGHT 10th transverse process and posterior RIGHT tenth rib with SUV max equal 4.6. There is a rounded peripherally sclerotic lesion at this level measuring 10 mm (image 107/CT series 4).  No additional suspicious lesions within the skeleton.   IMPRESSION: 1. Intense radiotracer activity in the posterior LEFT prostate gland consistent with primary prostate adenocarcinoma. 2. Focal radiotracer activity in the RIGHT iliac nodal station is favored to represent radiotracer within the adjacent ureter rather than a metastatic lymph node. No additional evidence of metastatic adenopathy. 3. Single focus radiotracer activity localizing to the posterior RIGHT tenth rib. Accompanying  peripherally sclerotic lesion on CT.  Solitary lesion is less likely to be prostate cancer skeletal metastasis; however, the imaging findings are suspicious for oligometastatic skeletal metastasis.  08/20/2022 Precious Bard, FNP [place] Scrotrum CLINICAL DATA:  Scrotal pain, pain at BILATERAL testes for 2 months, history of prostate cancer  FINDINGS: Right testicle Measurements: 3.6 x 2.0 x 2.9 cm. Normal echogenicity without mass or calcification. Internal blood flow present on color Doppler imaging.   Left testicle Measurements: 4.0 x 2.8 x 2.9 cm. Normal echogenicity without mass or calcification. Internal blood flow present on color Doppler imaging.   Right epididymis:  Normal in size and appearance.   Left epididymis:  Cyst at LEFT epididymal head 12 mm diameter.   Hydrocele:  None visualized.   Varicocele:  None visualized.   Pulsed Doppler interrogation of both testes demonstrates normal low resistance arterial and venous waveforms bilaterally.   IMPRESSION: 12 mm cyst at LEFT epididymal head.  Otherwise normal exam.   Past/Anticipated chemotherapy by medical oncology, if any:  NA  Pain on a scale of 0-10 is:  0 today but occasional left hip and prostate pain.  IPSS:  21 SHIM:  15   If Spine Met(s), symptoms, if any, include: Bowel/Bladder retention or incontinence (please describe):  No Numbness or weakness in extremities (please describe):  No Current Decadron regimen, if applicable: No  Ambulatory status? Walker? Wheelchair?:   Independent  SAFETY ISSUES: Prior radiation? No Pacemaker/ICD?  No Possible current pregnancy? Male Is the patient on methotrexate? No  Current Complaints / other details:  Need more information on treatment options.

## 2022-09-04 ENCOUNTER — Ambulatory Visit
Admission: RE | Admit: 2022-09-04 | Discharge: 2022-09-04 | Disposition: A | Discharge status: Home / Self Care | Payer: No Typology Code available for payment source | Source: Ambulatory Visit | Attending: Radiation Oncology | Admitting: Radiation Oncology

## 2022-09-04 ENCOUNTER — Other Ambulatory Visit: Payer: Self-pay

## 2022-09-04 VITALS — BP 152/86 | HR 86 | Temp 97.7°F | Resp 20 | Ht 67.0 in | Wt 170.8 lb

## 2022-09-04 DIAGNOSIS — Z79899 Other long term (current) drug therapy: Secondary | ICD-10-CM | POA: Diagnosis not present

## 2022-09-04 DIAGNOSIS — I10 Essential (primary) hypertension: Secondary | ICD-10-CM | POA: Diagnosis not present

## 2022-09-04 DIAGNOSIS — C61 Malignant neoplasm of prostate: Secondary | ICD-10-CM | POA: Insufficient documentation

## 2022-09-04 DIAGNOSIS — F17210 Nicotine dependence, cigarettes, uncomplicated: Secondary | ICD-10-CM | POA: Insufficient documentation

## 2022-09-04 DIAGNOSIS — J449 Chronic obstructive pulmonary disease, unspecified: Secondary | ICD-10-CM | POA: Insufficient documentation

## 2022-09-04 DIAGNOSIS — G4730 Sleep apnea, unspecified: Secondary | ICD-10-CM | POA: Diagnosis not present

## 2022-09-04 DIAGNOSIS — C7951 Secondary malignant neoplasm of bone: Secondary | ICD-10-CM | POA: Insufficient documentation

## 2022-09-04 NOTE — Progress Notes (Signed)
Radiation Oncology         [phone] ________________________________  Initial Outpatient Consultation  Name: Jason Rojas MRN: [id number]  Date: 09/04/2022  DOB: 03-25-1961  AC:ZYSAYT, Reola Mosher,*   REFERRING PHYSICIAN: Mingo Amber,*  DIAGNOSIS: 61 y.o. 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 Malignant neoplasm of prostate ([place])  C61       HISTORY OF PRESENT ILLNESS: Jason Rojas is a 61 y.o. male with a diagnosis of prostate cancer. He has a history of an elevated PSA dating back to at least 2016, at which time it was 5.15. Repeat PSA in 11/2019 showed a dramatic increase to 17 and per available notes from the [facility], he was referred to urology in 2021 and again in 2022 for a PSA of 28, but he no-showed to the appointments. His PSA increased further to 34 in 03/2022, and he complained of chronic hip and back pain. He underwent a whole-body bone scan on 06/01/22 showing a small focus of nonspecific uptake along the posterior right 11th rib and in the left grater trochanter.  The patient proceeded to transrectal ultrasound with 19 biopsies of the prostate on 06/05/22.  The prostate volume measured 58 cc.  Out of 6 right-sided core biopsies, 4 were positive, and out of 13 left-sided fragments, 2 were positive.  The maximum Gleason score was 3+4, and this was seen in the right-sided cores (with perineural invasion). The left-sided fragments were too limited to grade but showed Gleason pattern 3 and rare complex glands involving nerve bundles.  He underwent staging PSMA PET scan on 07/23/22 showing intense radiotracer activity in the posterior left prostate gland with focal radiotracer activity in the right iliac nodal station favored to represent radiotracer within the adjacent ureter rather than  metastatic lymph node and a single focus of radiotracer activity localizing to the posterior right 10th rib, with accompanying peripherally sclerotic lesion on CT.  The patient confirmed today that he started ADT with Eligard q1mo and Zytiga/prednisone daily on 08/15/22.   The patient reviewed the biopsy and imaging results with his urologist and he has kindly been referred today for discussion of potential radiation treatment options.   PREVIOUS RADIATION THERAPY: No  PAST MEDICAL HISTORY:  Past Medical History:  Diagnosis Date   Chronic back pain    COPD (chronic obstructive pulmonary disease) (HCC)    Depression    Hypertension    PTSD (post-traumatic stress disorder)    Sleep apnea       PAST SURGICAL HISTORY: Past Surgical History:  Procedure Laterality Date   APPENDECTOMY     BIOPSY  03/23/2019   Procedure: BIOPSY;  Surgeon: RDaneil Dolin MD;  [facility];  Service: Endoscopy;;  gastric   COLONOSCOPY     2010 and 2015, adenomas   COLONOSCOPY WITH PROPOFOL N/A 03/23/2019   Procedure: COLONOSCOPY WITH PROPOFOL;  Surgeon: RDaneil Dolin MD;  [facility];  Service: Endoscopy;  Laterality: N/A;  1:30pm   ESOPHAGOGASTRODUODENOSCOPY (EGD) WITH PROPOFOL N/A 03/23/2019   Procedure: ESOPHAGOGASTRODUODENOSCOPY (EGD) WITH PROPOFOL;  Surgeon: RDaneil Dolin MD;  [facility];  Service: Endoscopy;  Laterality: N/A;   POLYPECTOMY  03/23/2019   Procedure: POLYPECTOMY;  Surgeon: RDaneil Dolin MD;  [facility];  Service: Endoscopy;;  colon   TONSILLECTOMY      FAMILY HISTORY:  Family History  Problem Relation Age of Onset   Colon cancer Neg Hx     SOCIAL HISTORY:  Social History   Socioeconomic History   Marital status: Divorced    Spouse name: Not on file   Number of children: Not on file   Years of education: Not on file   Highest education level: Not on file  Occupational History   Not on file  Tobacco Use   Smoking  status: Every Day    Packs/day: 1.50    Types: Cigarettes   Smokeless tobacco: Never  Substance and Sexual Activity   Alcohol use: Yes    Comment: 4-5 beers daily   Drug use: No    Types: Cocaine    Comment: history, none in 3-4 years    Sexual activity: Never  Other Topics Concern   Not on file  Social History Narrative   Not on file   Social Determinants of Health   Financial Resource Strain: Not on file  Food Insecurity: Not on file  Transportation Needs: Not on file  Physical Activity: Not on file  Stress: Not on file  Social Connections: Not on file  Intimate Partner Violence: Not on file    ALLERGIES: Tuberculin purified protein derivative  MEDICATIONS:  Current Outpatient Medications  Medication Sig Dispense Refill   amLODipine (NORVASC) 5 MG tablet Take 5 mg by mouth daily.      carvedilol (COREG) 25 MG tablet Take 12.5 mg by mouth 2 (two) times daily with a meal.     diphenhydrAMINE (BENADRYL) 25 MG tablet Take 1 tablet (25 mg total) by mouth every 6 (six) hours as needed. 15 tablet 0   gabapentin (NEURONTIN) 800 MG tablet Take 800 mg by mouth 3 (three) times daily as needed (for pain/nerves).      ibuprofen (ADVIL) 600 MG tablet Take 600 mg by mouth 3 (three) times daily.      predniSONE (DELTASONE) 10 MG tablet Take 40 mg (4 tablets) for the first 5 days then 20 mg (2 tablets) for 5 days. 30 tablet 0   sildenafil (VIAGRA) 100 MG tablet Take 100 mg by mouth daily as needed for erectile dysfunction.     No current facility-administered medications for this encounter.    REVIEW OF SYSTEMS:  On review of systems, the patient reports that he is doing well overall. He denies any chest pain, shortness of breath, cough, fevers, chills, night sweats, unintended weight changes. He denies any bowel disturbances, and denies abdominal pain, nausea or vomiting. He denies any new musculoskeletal or joint aches or pains. His IPSS was 21, indicating severe urinary symptoms. His  SHIM was 15, indicating he has moderate erectile dysfunction. A complete review of systems is obtained and is otherwise negative.    PHYSICAL EXAM:  Wt Readings from Last 3 Encounters:  09/04/22 170 lb 12.8 oz (77.5 kg)  01/19/21 170 lb (77.1 kg)  08/02/20 165 lb (74.8 kg)   Temp Readings from Last 3 Encounters:  09/04/22 97.7 F (36.5 C)  01/19/21 98.8 F (37.1 C) (Oral)  08/02/20 98.6 F (37 C) (Oral)   BP Readings from Last 3 Encounters:  09/04/22 (!) 152/86  01/19/21 (!) 157/90  08/02/20 (!) 141/89   Pulse Readings from Last 3 Encounters:  09/04/22 86  01/19/21 85  08/02/20 (!) 101   Pain Assessment Pain Score: 0-No pain/10  In general this is  a well appearing African American male in no acute distress. He's alert and oriented x4 and appropriate throughout the examination. Cardiopulmonary assessment is negative for acute distress, and he exhibits normal effort.     KPS = 90  100 - Normal; no complaints; no evidence of disease. 90   - Able to carry on normal activity; minor signs or symptoms of disease. 80   - Normal activity with effort; some signs or symptoms of disease. 70   - Cares for self; unable to carry on normal activity or to do active work. 60   - Requires occasional assistance, but is able to care for most of his personal needs. 50   - Requires considerable assistance and frequent medical care. 73   - Disabled; requires special care and assistance. 78   - Severely disabled; hospital admission is indicated although death not imminent. 64   - Very sick; hospital admission necessary; active supportive treatment necessary. 10   - Moribund; fatal processes progressing rapidly. 0     - Dead  Karnofsky DA, Abelmann [place], Craver LS and Burchenal [facility] [phone]) The use of the nitrogen mustards in the palliative treatment of carcinoma: with particular reference to bronchogenic carcinoma Cancer 1 [phone]  LABORATORY DATA:  Lab Results  Component Value Date   WBC 10.1  10/20/2014   HGB 14.4 10/20/2014   HCT 42.1 10/20/2014   MCV 85.2 10/20/2014   PLT 219 10/20/2014   Lab Results  Component Value Date   NA 137 10/20/2014   K 4.0 10/20/2014   CL 106 10/20/2014   CO2 27 10/20/2014   Lab Results  Component Value Date   ALT 31 12/04/2011   AST 31 12/04/2011   ALKPHOS 88 12/04/2011   BILITOT 0.4 12/04/2011     RADIOGRAPHY: US SCROTUM W/DOPPLER  Result Date: 08/20/2022 CLINICAL DATA:  Scrotal pain, pain at BILATERAL testes for 2 months, history of prostate cancer EXAM: SCROTAL ULTRASOUND DOPPLER ULTRASOUND OF THE TESTICLES TECHNIQUE: Complete ultrasound examination of the testicles, epididymis, and other scrotal structures was performed. Color and spectral Doppler ultrasound were also utilized to evaluate blood flow to the testicles. COMPARISON:  None Available. FINDINGS: Right testicle Measurements: 3.6 x 2.0 x 2.9 cm. Normal echogenicity without mass or calcification. Internal blood flow present on color Doppler imaging. Left testicle Measurements: 4.0 x 2.8 x 2.9 cm. Normal echogenicity without mass or calcification. Internal blood flow present on color Doppler imaging. Right epididymis:  Normal in size and appearance. Left epididymis:  Cyst at LEFT epididymal head 12 mm diameter. Hydrocele:  None visualized. Varicocele:  None visualized. Pulsed Doppler interrogation of both testes demonstrates normal low resistance arterial and venous waveforms bilaterally. IMPRESSION: 12 mm cyst at LEFT epididymal head. Otherwise normal exam. Electronically Signed   By: Lavonia Dana M.D.   On: 08/20/2022 17:17      IMPRESSION/PLAN: 1. 61 y.o. 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 We discussed the patient's workup and outlined the nature of prostate cancer in this setting. 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 Accordingly, he is eligible for a variety of potential treatment options including prostatectomy or LT-ADT concurrent with either 8 weeks of external radiation or 5 weeks of  external radiation with an upfront brachytherapy boost. We discussed the available radiation techniques, and focused on the details and logistics of delivery. The patient is not an ideal candidate for brachytherapy boost due to his severe LUTS despite medical therapy with Flomax. Therefore, we discussed and outlined the risks, benefits, short and long-term effects associated with daily external beam radiotherapy and compared and contrasted these with prostatectomy. We discussed the role of ADT in the treatment of high risk prostate cancer and outlined the associated side effects that could be expected with this therapy. He has already started ADT with total androgen blockade (Eligard and Zytiga/prednisone) through the [facility] on 08/15/22. We also discussed the role of stereotactic body radiation (SBRT) in the treatment of oligometastatic lesions.  He appears to have a good understanding of his disease and our treatment recommendations which are of curative intent.  He was encouraged to ask questions that were answered to his stated satisfaction.   At the conclusion of our conversation, the patient is interested in moving forward with the recommended 8 week course of external beam therapy concurrent with LT-ADT and total androgen blockade which he has already started. He is also in agreement to proceed with the recommended 5 fraction course of SBRT to the solitary rib metastasis, concurrent with his prostate radiation.  We will share our discussion with his team at the [facility] and make arrangements for fiducial markers, prior to [place] in 10/2022, in anticipation of beginning his treatment approximately 2 months  after starting ADT. He will also need transportation to and from daily visits at the [facility] so we will connect him with the [place] transportation service.  We enjoyed meeting the patient today and look forward to continuing to participate in his care.   We personally spent 70 minutes in this encounter including chart review, reviewing radiological studies, meeting face-to-face with the patient, entering orders and completing documentation.    Nicholos Johns, PA-C    Tyler Pita, MD  [facility] Direct Dial: [phone]  Fax: [phone] [email or web address]  Skype  LinkedIn   This document serves as a record of services personally performed by Tyler Pita, MD and Freeman Caldron, PA-C. It was created on their behalf by Wilburn Mylar, a trained medical scribe. The creation of this record is based on the scribe's personal observations and the provider's statements to them. This document has been checked and approved by the attending provider.

## 2022-09-19 ENCOUNTER — Telehealth: Payer: Self-pay | Admitting: *Deleted

## 2022-09-19 NOTE — Telephone Encounter (Signed)
CALLED PATIENT TO INFORM OF SIM APPT. FOR 10-18-22- ARRIVAL TIME- 9:45 AM @ [facility], SPOKE WITH PATIENT'S WIFE- BARBARA PRICE AND SHE IS AWARE OF THIS APPT.

## 2022-09-27 ENCOUNTER — Telehealth: Payer: Self-pay

## 2022-09-27 NOTE — Telephone Encounter (Signed)
[name] called wanted to know when he's radiation treatment will begin and had questions about a medications that was prescribed by the [place].  RN informed [name] of next appointment with radiation oncology was scheduled for 10/18/2021 for a CT simulation planning appointment.  [name] said will contact the [place] about his medications he said he had so many appointments and doctors he wasn't sure which doctor to call.  He was appreciative of the information given and ended call.

## 2022-10-17 ENCOUNTER — Telehealth: Payer: Self-pay | Admitting: *Deleted

## 2022-10-17 DIAGNOSIS — C61 Malignant neoplasm of prostate: Secondary | ICD-10-CM | POA: Insufficient documentation

## 2022-10-17 NOTE — Telephone Encounter (Signed)
CALLED PATIENT TO REMIND OF SIM APPTS. FOR 10-18-22- ARRIVAL TIME- 9:45 AM @ [facility], SPOKE WITH PATIENT AND HE IS AWARE OF THESE APPTS.

## 2022-10-17 NOTE — Progress Notes (Signed)
  Radiation Oncology         [phone] ________________________________  Name: Jason Rojas MRN: [id number]  Date: 10/18/2022  DOB: 29-May-1961  SIMULATION AND TREATMENT PLANNING NOTE    ICD-10-CM   1. Malignant neoplasm of prostate ([place])  C61       DIAGNOSIS:  62 y.o. 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   NARRATIVE:  The patient was brought to the [place].  Identity was confirmed.  All relevant records and images related to the planned course of therapy were reviewed.  The patient freely provided informed written consent to proceed with treatment after reviewing the details related to the planned course of therapy. The consent form was witnessed and verified by the simulation staff.  Then, the patient was set-up in a stable reproducible supine position for radiation therapy.  A vacuum lock pillow device was custom fabricated to position his legs in a reproducible immobilized position.  Then, I performed a urethrogram under sterile conditions to identify the prostatic apex.  CT images were obtained.  Surface markings were placed.  The CT images were loaded into the planning software.  Then the prostate target and avoidance structures including the rectum, bladder, bowel and hips were contoured.  Treatment planning then occurred.  The radiation prescription was entered and confirmed.  A total of one complex treatment devices was fabricated. I have requested : Intensity Modulated Radiotherapy (IMRT) is medically necessary for this case for the following reason:  Rectal sparing.Marland Kitchen  PLAN:   The prostate, seminal vesicles, and pelvic lymph nodes will initially be treated to 45 Gy in 25 fractions of 1.8 Gy followed by a boost to the prostate only, to 75 Gy with 15 additional fractions of 2.0 Gy; simultaneous to a 5 fraction course of SBRT to the  solitary rib metastasis.   ________________________________  Sheral Apley Tammi Klippel, M.D.

## 2022-10-17 NOTE — Progress Notes (Signed)
  Radiation Oncology         [phone] ________________________________  Name: NIKOLI NASSER MRN: [id number]  Date: 10/18/2022  DOB: 04/16/1961  ULTRAHYPOFRACTIONATED RADIOTHERAPY  SIMULATION AND TREATMENT PLANNING NOTE    ICD-10-CM   1. Neoplasm of prostate, distant metastasis staging category M1b: metastasis to bone ([place])  C61    C79.51       DIAGNOSIS:  62 y.o. 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  Identity was confirmed.  All relevant records and images related to the planned course of therapy were reviewed.  The patient freely provided informed written consent to proceed with treatment after reviewing the details related to the planned course of therapy. The consent form was witnessed and verified by the simulation staff.  Then, the patient was set-up in a stable reproducible  supine position for radiation therapy.  A BodyFix immobilization pillow was fabricated for reproducible positioning.  Surface markings were placed.  The CT images were loaded into the planning software.  The gross target volumes (GTV) and planning target volumes (PTV) were delinieated, and avoidance structures were contoured.  Treatment planning then occurred.  The radiation prescription was entered and confirmed.  A total of two complex treatment devices were fabricated in the form of the BodyFix immobilization pillow and a neck accuform cushion.  I have requested : 3D Simulation  I have requested a DVH of the following structures: targets and all normal structures near the target including spinal cord, lungs, skin and chest wall as noted on the radiation plan to maintain doses in adherence with established limits  SPECIAL TREATMENT PROCEDURE:  The planned course of therapy using radiation  constitutes a special treatment procedure. Special care is required in the management of this patient for the following reasons. High dose per fraction requiring special monitoring for increased toxicities of treatment including daily imaging..  The special nature of the planned course of radiotherapy will require increased physician supervision and oversight to ensure patient's safety with optimal treatment outcomes.    This requires extended time and effort.    PLAN:  The patient will receive 40 Gy in 5 fractions.  ________________________________  Sheral Apley Tammi Klippel, M.D.

## 2022-10-18 ENCOUNTER — Other Ambulatory Visit: Payer: Self-pay

## 2022-10-18 ENCOUNTER — Ambulatory Visit
Admission: RE | Admit: 2022-10-18 | Discharge: 2022-10-18 | Disposition: A | Discharge status: Home / Self Care | Payer: No Typology Code available for payment source | Source: Ambulatory Visit | Attending: Radiation Oncology | Admitting: Radiation Oncology

## 2022-10-18 DIAGNOSIS — C7951 Secondary malignant neoplasm of bone: Secondary | ICD-10-CM | POA: Insufficient documentation

## 2022-10-18 DIAGNOSIS — Z510 Encounter for antineoplastic radiation therapy: Secondary | ICD-10-CM | POA: Insufficient documentation

## 2022-10-18 DIAGNOSIS — C61 Malignant neoplasm of prostate: Secondary | ICD-10-CM | POA: Insufficient documentation

## 2022-10-23 DIAGNOSIS — Z510 Encounter for antineoplastic radiation therapy: Secondary | ICD-10-CM | POA: Diagnosis not present

## 2022-10-29 ENCOUNTER — Ambulatory Visit
Admission: RE | Admit: 2022-10-29 | Discharge: 2022-10-29 | Disposition: A | Discharge status: Home / Self Care | Payer: No Typology Code available for payment source | Source: Ambulatory Visit | Attending: Radiation Oncology | Admitting: Radiation Oncology

## 2022-10-29 ENCOUNTER — Other Ambulatory Visit: Payer: Self-pay

## 2022-10-29 DIAGNOSIS — Z510 Encounter for antineoplastic radiation therapy: Secondary | ICD-10-CM | POA: Diagnosis not present

## 2022-10-29 LAB — RAD ONC ARIA SESSION SUMMARY
Course Elapsed Days: 0
Course Elapsed Days: 0
Plan Fractions Treated to Date: 1
Plan Fractions Treated to Date: 1
Plan Prescribed Dose Per Fraction: 1.8 Gy
Plan Prescribed Dose Per Fraction: 8 Gy
Plan Total Fractions Prescribed: 25
Plan Total Fractions Prescribed: 5
Plan Total Prescribed Dose: 45 Gy
Plan Total Prescribed Dose: 40 Gy
Reference Point Dosage Given to Date: 1.8 Gy
Reference Point Dosage Given to Date: 8 Gy
Reference Point Session Dosage Given: 1.8 Gy
Reference Point Session Dosage Given: 8 Gy
Session Number: 1
Session Number: 2

## 2022-10-30 ENCOUNTER — Encounter: Payer: Self-pay | Admitting: Radiation Oncology

## 2022-10-30 ENCOUNTER — Other Ambulatory Visit: Payer: Self-pay

## 2022-10-30 ENCOUNTER — Ambulatory Visit
Admission: RE | Admit: 2022-10-30 | Discharge: 2022-10-30 | Disposition: A | Discharge status: Home / Self Care | Payer: No Typology Code available for payment source | Source: Ambulatory Visit | Attending: Radiation Oncology

## 2022-10-30 ENCOUNTER — Ambulatory Visit: Payer: No Typology Code available for payment source | Admitting: Radiation Oncology

## 2022-10-30 DIAGNOSIS — Z510 Encounter for antineoplastic radiation therapy: Secondary | ICD-10-CM | POA: Diagnosis not present

## 2022-10-30 LAB — RAD ONC ARIA SESSION SUMMARY
Course Elapsed Days: 1
Plan Fractions Treated to Date: 2
Plan Prescribed Dose Per Fraction: 1.8 Gy
Plan Total Fractions Prescribed: 25
Plan Total Prescribed Dose: 45 Gy
Reference Point Dosage Given to Date: 3.6 Gy
Reference Point Session Dosage Given: 1.8 Gy
Session Number: 3

## 2022-10-30 NOTE — Patient Instructions (Signed)
Patient requested to speak to me about getting a gas card.  Upon checking he was not signed up for the [employer].  I told him the information he would need to bring in to sign up for the grant.  He vocalized understanding.

## 2022-10-31 ENCOUNTER — Ambulatory Visit
Admission: RE | Admit: 2022-10-31 | Discharge: 2022-10-31 | Disposition: A | Discharge status: Home / Self Care | Payer: No Typology Code available for payment source | Source: Ambulatory Visit | Attending: Radiation Oncology | Admitting: Radiation Oncology

## 2022-10-31 ENCOUNTER — Other Ambulatory Visit: Payer: Self-pay

## 2022-10-31 ENCOUNTER — Ambulatory Visit: Admission: RE | Admit: 2022-10-31 | Payer: No Typology Code available for payment source | Source: Ambulatory Visit

## 2022-10-31 DIAGNOSIS — C61 Malignant neoplasm of prostate: Secondary | ICD-10-CM

## 2022-10-31 DIAGNOSIS — Z510 Encounter for antineoplastic radiation therapy: Secondary | ICD-10-CM | POA: Diagnosis not present

## 2022-10-31 LAB — RAD ONC ARIA SESSION SUMMARY
Course Elapsed Days: 2
Course Elapsed Days: 2
Plan Fractions Treated to Date: 3
Plan Fractions Treated to Date: 2
Plan Prescribed Dose Per Fraction: 1.8 Gy
Plan Prescribed Dose Per Fraction: 8 Gy
Plan Total Fractions Prescribed: 25
Plan Total Fractions Prescribed: 5
Plan Total Prescribed Dose: 45 Gy
Plan Total Prescribed Dose: 40 Gy
Reference Point Dosage Given to Date: 5.4 Gy
Reference Point Dosage Given to Date: 16 Gy
Reference Point Session Dosage Given: 1.8 Gy
Reference Point Session Dosage Given: 8 Gy
Session Number: 4
Session Number: 5

## 2022-11-01 ENCOUNTER — Ambulatory Visit
Admission: RE | Admit: 2022-11-01 | Discharge: 2022-11-01 | Disposition: A | Discharge status: Home / Self Care | Payer: No Typology Code available for payment source | Source: Ambulatory Visit | Attending: Radiation Oncology | Admitting: Radiation Oncology

## 2022-11-01 ENCOUNTER — Other Ambulatory Visit: Payer: Self-pay

## 2022-11-01 ENCOUNTER — Ambulatory Visit: Payer: No Typology Code available for payment source | Admitting: Radiation Oncology

## 2022-11-01 DIAGNOSIS — C7951 Secondary malignant neoplasm of bone: Secondary | ICD-10-CM | POA: Diagnosis present

## 2022-11-01 DIAGNOSIS — C61 Malignant neoplasm of prostate: Secondary | ICD-10-CM | POA: Insufficient documentation

## 2022-11-01 LAB — RAD ONC ARIA SESSION SUMMARY
Course Elapsed Days: 3
Plan Fractions Treated to Date: 4
Plan Prescribed Dose Per Fraction: 1.8 Gy
Plan Total Fractions Prescribed: 25
Plan Total Prescribed Dose: 45 Gy
Reference Point Dosage Given to Date: 7.2 Gy
Reference Point Session Dosage Given: 1.8 Gy
Session Number: 6

## 2022-11-02 ENCOUNTER — Ambulatory Visit: Payer: No Typology Code available for payment source

## 2022-11-02 ENCOUNTER — Ambulatory Visit
Admission: RE | Admit: 2022-11-02 | Discharge: 2022-11-02 | Disposition: A | Discharge status: Home / Self Care | Payer: No Typology Code available for payment source | Source: Ambulatory Visit | Attending: Radiation Oncology | Admitting: Radiation Oncology

## 2022-11-02 ENCOUNTER — Other Ambulatory Visit: Payer: Self-pay

## 2022-11-02 DIAGNOSIS — C61 Malignant neoplasm of prostate: Secondary | ICD-10-CM

## 2022-11-02 LAB — RAD ONC ARIA SESSION SUMMARY
Course Elapsed Days: 4
Course Elapsed Days: 4
Plan Fractions Treated to Date: 3
Plan Fractions Treated to Date: 5
Plan Prescribed Dose Per Fraction: 1.8 Gy
Plan Prescribed Dose Per Fraction: 8 Gy
Plan Total Fractions Prescribed: 25
Plan Total Fractions Prescribed: 5
Plan Total Prescribed Dose: 40 Gy
Plan Total Prescribed Dose: 45 Gy
Reference Point Dosage Given to Date: 24 Gy
Reference Point Dosage Given to Date: 9 Gy
Reference Point Session Dosage Given: 1.8 Gy
Reference Point Session Dosage Given: 8 Gy
Session Number: 7
Session Number: 8

## 2022-11-05 ENCOUNTER — Ambulatory Visit
Admission: RE | Admit: 2022-11-05 | Discharge: 2022-11-05 | Disposition: A | Discharge status: Home / Self Care | Payer: No Typology Code available for payment source | Source: Ambulatory Visit | Attending: Radiation Oncology | Admitting: Radiation Oncology

## 2022-11-05 ENCOUNTER — Other Ambulatory Visit: Payer: Self-pay

## 2022-11-05 DIAGNOSIS — C61 Malignant neoplasm of prostate: Secondary | ICD-10-CM | POA: Diagnosis not present

## 2022-11-05 LAB — RAD ONC ARIA SESSION SUMMARY
Course Elapsed Days: 7
Plan Fractions Treated to Date: 6
Plan Prescribed Dose Per Fraction: 1.8 Gy
Plan Total Fractions Prescribed: 25
Plan Total Prescribed Dose: 45 Gy
Reference Point Dosage Given to Date: 10.8 Gy
Reference Point Session Dosage Given: 1.8 Gy
Session Number: 9

## 2022-11-06 ENCOUNTER — Ambulatory Visit
Admission: RE | Admit: 2022-11-06 | Discharge: 2022-11-06 | Disposition: A | Discharge status: Home / Self Care | Payer: No Typology Code available for payment source | Source: Ambulatory Visit | Attending: Radiation Oncology

## 2022-11-06 ENCOUNTER — Other Ambulatory Visit: Payer: Self-pay

## 2022-11-06 ENCOUNTER — Ambulatory Visit
Admission: RE | Admit: 2022-11-06 | Discharge: 2022-11-06 | Disposition: A | Discharge status: Home / Self Care | Payer: No Typology Code available for payment source | Source: Ambulatory Visit | Attending: Radiation Oncology | Admitting: Radiation Oncology

## 2022-11-06 DIAGNOSIS — C61 Malignant neoplasm of prostate: Secondary | ICD-10-CM | POA: Diagnosis not present

## 2022-11-06 LAB — RAD ONC ARIA SESSION SUMMARY
Course Elapsed Days: 8
Course Elapsed Days: 8
Plan Fractions Treated to Date: 7
Plan Fractions Treated to Date: 4
Plan Prescribed Dose Per Fraction: 1.8 Gy
Plan Prescribed Dose Per Fraction: 8 Gy
Plan Total Fractions Prescribed: 25
Plan Total Fractions Prescribed: 5
Plan Total Prescribed Dose: 45 Gy
Plan Total Prescribed Dose: 40 Gy
Reference Point Dosage Given to Date: 12.6 Gy
Reference Point Dosage Given to Date: 32 Gy
Reference Point Session Dosage Given: 1.8 Gy
Reference Point Session Dosage Given: 8 Gy
Session Number: 10
Session Number: 11

## 2022-11-07 ENCOUNTER — Ambulatory Visit
Admission: RE | Admit: 2022-11-07 | Discharge: 2022-11-07 | Disposition: A | Discharge status: Home / Self Care | Payer: No Typology Code available for payment source | Source: Ambulatory Visit | Attending: Radiation Oncology | Admitting: Radiation Oncology

## 2022-11-07 ENCOUNTER — Other Ambulatory Visit: Payer: Self-pay

## 2022-11-07 DIAGNOSIS — C61 Malignant neoplasm of prostate: Secondary | ICD-10-CM | POA: Diagnosis not present

## 2022-11-07 LAB — RAD ONC ARIA SESSION SUMMARY
Course Elapsed Days: 9
Plan Fractions Treated to Date: 8
Plan Prescribed Dose Per Fraction: 1.8 Gy
Plan Total Fractions Prescribed: 25
Plan Total Prescribed Dose: 45 Gy
Reference Point Dosage Given to Date: 14.4 Gy
Reference Point Session Dosage Given: 1.8 Gy
Session Number: 12

## 2022-11-08 ENCOUNTER — Ambulatory Visit
Admission: RE | Admit: 2022-11-08 | Discharge: 2022-11-08 | Discharge status: Home / Self Care | Payer: No Typology Code available for payment source | Source: Ambulatory Visit | Attending: Radiation Oncology

## 2022-11-08 ENCOUNTER — Ambulatory Visit
Admission: RE | Admit: 2022-11-08 | Discharge: 2022-11-08 | Disposition: A | Discharge status: Home / Self Care | Payer: No Typology Code available for payment source | Source: Ambulatory Visit | Attending: Radiation Oncology | Admitting: Radiation Oncology

## 2022-11-08 ENCOUNTER — Other Ambulatory Visit: Payer: Self-pay | Admitting: Radiation Oncology

## 2022-11-08 ENCOUNTER — Other Ambulatory Visit (HOSPITAL_COMMUNITY): Payer: Self-pay

## 2022-11-08 ENCOUNTER — Other Ambulatory Visit: Payer: Self-pay

## 2022-11-08 DIAGNOSIS — C61 Malignant neoplasm of prostate: Secondary | ICD-10-CM

## 2022-11-08 LAB — RAD ONC ARIA SESSION SUMMARY
Course Elapsed Days: 10
Plan Fractions Treated to Date: 9
Plan Prescribed Dose Per Fraction: 1.8 Gy
Plan Total Fractions Prescribed: 25
Plan Total Prescribed Dose: 45 Gy
Reference Point Dosage Given to Date: 16.2 Gy
Reference Point Session Dosage Given: 1.8 Gy
Session Number: 13

## 2022-11-08 MED ORDER — HYDROCORTISONE ACETATE 25 MG RE SUPP
25.0000 mg | Freq: Two times a day (BID) | RECTAL | 5 refills | Status: DC
  Filled 2022-11-08: qty 12, 6d supply, fill #0

## 2022-11-09 ENCOUNTER — Other Ambulatory Visit: Payer: Self-pay

## 2022-11-09 ENCOUNTER — Ambulatory Visit
Admission: RE | Admit: 2022-11-09 | Discharge: 2022-11-09 | Disposition: A | Discharge status: Home / Self Care | Payer: No Typology Code available for payment source | Source: Ambulatory Visit | Attending: Radiation Oncology

## 2022-11-09 DIAGNOSIS — C61 Malignant neoplasm of prostate: Secondary | ICD-10-CM | POA: Diagnosis not present

## 2022-11-09 LAB — RAD ONC ARIA SESSION SUMMARY
Course Elapsed Days: 11
Plan Fractions Treated to Date: 10
Plan Prescribed Dose Per Fraction: 1.8 Gy
Plan Total Fractions Prescribed: 25
Plan Total Prescribed Dose: 45 Gy
Reference Point Dosage Given to Date: 18 Gy
Reference Point Session Dosage Given: 1.8 Gy
Session Number: 14

## 2022-11-12 ENCOUNTER — Ambulatory Visit
Admission: RE | Admit: 2022-11-12 | Discharge: 2022-11-12 | Disposition: A | Discharge status: Home / Self Care | Payer: No Typology Code available for payment source | Source: Ambulatory Visit | Attending: Radiation Oncology

## 2022-11-12 ENCOUNTER — Other Ambulatory Visit: Payer: Self-pay

## 2022-11-12 DIAGNOSIS — C61 Malignant neoplasm of prostate: Secondary | ICD-10-CM | POA: Diagnosis not present

## 2022-11-12 LAB — RAD ONC ARIA SESSION SUMMARY
Course Elapsed Days: 14
Plan Fractions Treated to Date: 11
Plan Prescribed Dose Per Fraction: 1.8 Gy
Plan Total Fractions Prescribed: 25
Plan Total Prescribed Dose: 45 Gy
Reference Point Dosage Given to Date: 19.8 Gy
Reference Point Session Dosage Given: 1.8 Gy
Session Number: 15

## 2022-11-13 ENCOUNTER — Other Ambulatory Visit: Payer: Self-pay

## 2022-11-13 ENCOUNTER — Ambulatory Visit
Admission: RE | Admit: 2022-11-13 | Discharge: 2022-11-13 | Disposition: A | Discharge status: Home / Self Care | Payer: No Typology Code available for payment source | Source: Ambulatory Visit | Attending: Radiation Oncology | Admitting: Radiation Oncology

## 2022-11-13 DIAGNOSIS — C61 Malignant neoplasm of prostate: Secondary | ICD-10-CM | POA: Diagnosis not present

## 2022-11-13 LAB — RAD ONC ARIA SESSION SUMMARY
Course Elapsed Days: 15
Plan Fractions Treated to Date: 12
Plan Prescribed Dose Per Fraction: 1.8 Gy
Plan Total Fractions Prescribed: 25
Plan Total Prescribed Dose: 45 Gy
Reference Point Dosage Given to Date: 21.6 Gy
Reference Point Session Dosage Given: 1.8 Gy
Session Number: 16

## 2022-11-13 NOTE — Progress Notes (Signed)
Jason Rojas received radiation treatment prostate and as he was getting off the table slid down to floor no injury observed at this time.  Patient alert and verbalized that he wasn't hurt and that he just lost his footing with socks.  Radiation therapist Claire Shown present at time of incident.  When RN was called over to the [facility] 4 machine, RN observed patient in a seated position on the floor next to the radiation table.  Socks on feet and gown on.  Lighting good no obstacles in the area.  Denies any visual disturbances prior to getting off table.  Denies hitting head landed straight on his bottom.  Vitals:  98.2-86-20-158/84 100% O2 sat.   [name] was assisted to wheelchair and taking to dressing area.  He didn't want to be seen by doctor at this time.  Mr. November was advised when going into the treatment room to either have on shoes or ask for socks with rubber soles for safety purposes.  And to let us know if have any pain or discomfort.  Documented in Safety portal site. Dr. Tammi Klippel and Freeman Caldron, PA-C made aware.  Mr. Mickel was appreciative of help and asked how this writer was doing to get attention off himself.

## 2022-11-14 ENCOUNTER — Other Ambulatory Visit: Payer: Self-pay

## 2022-11-14 ENCOUNTER — Ambulatory Visit
Admission: RE | Admit: 2022-11-14 | Discharge: 2022-11-14 | Disposition: A | Discharge status: Home / Self Care | Payer: No Typology Code available for payment source | Source: Ambulatory Visit | Attending: Radiation Oncology | Admitting: Radiation Oncology

## 2022-11-14 DIAGNOSIS — C61 Malignant neoplasm of prostate: Secondary | ICD-10-CM | POA: Diagnosis not present

## 2022-11-14 LAB — RAD ONC ARIA SESSION SUMMARY
Course Elapsed Days: 16
Plan Fractions Treated to Date: 13
Plan Prescribed Dose Per Fraction: 1.8 Gy
Plan Total Fractions Prescribed: 25
Plan Total Prescribed Dose: 45 Gy
Reference Point Dosage Given to Date: 23.4 Gy
Reference Point Session Dosage Given: 1.8 Gy
Session Number: 17

## 2022-11-15 ENCOUNTER — Other Ambulatory Visit: Payer: Self-pay

## 2022-11-15 ENCOUNTER — Ambulatory Visit
Admission: RE | Admit: 2022-11-15 | Discharge: 2022-11-15 | Disposition: A | Discharge status: Home / Self Care | Payer: No Typology Code available for payment source | Source: Ambulatory Visit | Attending: Radiation Oncology | Admitting: Radiation Oncology

## 2022-11-15 ENCOUNTER — Ambulatory Visit: Payer: No Typology Code available for payment source

## 2022-11-15 DIAGNOSIS — C61 Malignant neoplasm of prostate: Secondary | ICD-10-CM | POA: Diagnosis not present

## 2022-11-15 LAB — RAD ONC ARIA SESSION SUMMARY
Course Elapsed Days: 17
Plan Fractions Treated to Date: 14
Plan Prescribed Dose Per Fraction: 1.8 Gy
Plan Total Fractions Prescribed: 25
Plan Total Prescribed Dose: 45 Gy
Reference Point Dosage Given to Date: 25.2 Gy
Reference Point Session Dosage Given: 1.8 Gy
Session Number: 18

## 2022-11-16 ENCOUNTER — Other Ambulatory Visit: Payer: Self-pay

## 2022-11-16 ENCOUNTER — Ambulatory Visit
Admission: RE | Admit: 2022-11-16 | Discharge: 2022-11-16 | Disposition: A | Discharge status: Home / Self Care | Payer: No Typology Code available for payment source | Source: Ambulatory Visit | Attending: Radiation Oncology | Admitting: Radiation Oncology

## 2022-11-16 ENCOUNTER — Ambulatory Visit: Payer: No Typology Code available for payment source

## 2022-11-16 DIAGNOSIS — C61 Malignant neoplasm of prostate: Secondary | ICD-10-CM | POA: Diagnosis not present

## 2022-11-16 LAB — RAD ONC ARIA SESSION SUMMARY
Course Elapsed Days: 18
Plan Fractions Treated to Date: 15
Plan Prescribed Dose Per Fraction: 1.8 Gy
Plan Total Fractions Prescribed: 25
Plan Total Prescribed Dose: 45 Gy
Reference Point Dosage Given to Date: 27 Gy
Reference Point Session Dosage Given: 1.8 Gy
Session Number: 19

## 2022-11-19 ENCOUNTER — Ambulatory Visit
Admission: RE | Admit: 2022-11-19 | Discharge: 2022-11-19 | Disposition: A | Discharge status: Home / Self Care | Payer: No Typology Code available for payment source | Source: Ambulatory Visit | Attending: Radiation Oncology | Admitting: Radiation Oncology

## 2022-11-19 ENCOUNTER — Other Ambulatory Visit: Payer: Self-pay

## 2022-11-19 DIAGNOSIS — C61 Malignant neoplasm of prostate: Secondary | ICD-10-CM | POA: Diagnosis not present

## 2022-11-19 LAB — RAD ONC ARIA SESSION SUMMARY
Course Elapsed Days: 21
Plan Fractions Treated to Date: 16
Plan Prescribed Dose Per Fraction: 1.8 Gy
Plan Total Fractions Prescribed: 25
Plan Total Prescribed Dose: 45 Gy
Reference Point Dosage Given to Date: 28.8 Gy
Reference Point Session Dosage Given: 1.8 Gy
Session Number: 20

## 2022-11-20 ENCOUNTER — Ambulatory Visit
Admission: RE | Admit: 2022-11-20 | Discharge: 2022-11-20 | Disposition: A | Discharge status: Home / Self Care | Payer: No Typology Code available for payment source | Source: Ambulatory Visit | Attending: Radiation Oncology

## 2022-11-20 ENCOUNTER — Other Ambulatory Visit: Payer: Self-pay

## 2022-11-20 DIAGNOSIS — C61 Malignant neoplasm of prostate: Secondary | ICD-10-CM | POA: Diagnosis not present

## 2022-11-20 LAB — RAD ONC ARIA SESSION SUMMARY
Course Elapsed Days: 22
Plan Fractions Treated to Date: 17
Plan Prescribed Dose Per Fraction: 1.8 Gy
Plan Total Fractions Prescribed: 25
Plan Total Prescribed Dose: 45 Gy
Reference Point Dosage Given to Date: 30.6 Gy
Reference Point Session Dosage Given: 1.8 Gy
Session Number: 21

## 2022-11-21 ENCOUNTER — Other Ambulatory Visit: Payer: Self-pay

## 2022-11-21 ENCOUNTER — Ambulatory Visit
Admission: RE | Admit: 2022-11-21 | Discharge: 2022-11-21 | Disposition: A | Discharge status: Home / Self Care | Payer: No Typology Code available for payment source | Source: Ambulatory Visit | Attending: Radiation Oncology | Admitting: Radiation Oncology

## 2022-11-21 DIAGNOSIS — C61 Malignant neoplasm of prostate: Secondary | ICD-10-CM | POA: Diagnosis not present

## 2022-11-21 LAB — RAD ONC ARIA SESSION SUMMARY
Course Elapsed Days: 23
Plan Fractions Treated to Date: 18
Plan Prescribed Dose Per Fraction: 1.8 Gy
Plan Total Fractions Prescribed: 25
Plan Total Prescribed Dose: 45 Gy
Reference Point Dosage Given to Date: 32.4 Gy
Reference Point Session Dosage Given: 1.8 Gy
Session Number: 22

## 2022-11-22 ENCOUNTER — Ambulatory Visit
Admission: RE | Admit: 2022-11-22 | Discharge: 2022-11-22 | Disposition: A | Discharge status: Home / Self Care | Payer: No Typology Code available for payment source | Source: Ambulatory Visit | Attending: Radiation Oncology | Admitting: Radiation Oncology

## 2022-11-22 ENCOUNTER — Other Ambulatory Visit: Payer: Self-pay

## 2022-11-22 DIAGNOSIS — C61 Malignant neoplasm of prostate: Secondary | ICD-10-CM | POA: Diagnosis not present

## 2022-11-22 LAB — RAD ONC ARIA SESSION SUMMARY
Course Elapsed Days: 24
Plan Fractions Treated to Date: 19
Plan Prescribed Dose Per Fraction: 1.8 Gy
Plan Total Fractions Prescribed: 25
Plan Total Prescribed Dose: 45 Gy
Reference Point Dosage Given to Date: 34.2 Gy
Reference Point Session Dosage Given: 1.8 Gy
Session Number: 23

## 2022-11-23 ENCOUNTER — Ambulatory Visit
Admission: RE | Admit: 2022-11-23 | Discharge: 2022-11-23 | Disposition: A | Discharge status: Home / Self Care | Payer: No Typology Code available for payment source | Source: Ambulatory Visit | Attending: Radiation Oncology | Admitting: Radiation Oncology

## 2022-11-23 ENCOUNTER — Other Ambulatory Visit: Payer: Self-pay

## 2022-11-23 ENCOUNTER — Other Ambulatory Visit: Payer: Self-pay | Admitting: Radiation Oncology

## 2022-11-23 ENCOUNTER — Other Ambulatory Visit (HOSPITAL_COMMUNITY): Payer: Self-pay

## 2022-11-23 DIAGNOSIS — C61 Malignant neoplasm of prostate: Secondary | ICD-10-CM | POA: Diagnosis not present

## 2022-11-23 LAB — RAD ONC ARIA SESSION SUMMARY
Course Elapsed Days: 25
Plan Fractions Treated to Date: 20
Plan Prescribed Dose Per Fraction: 1.8 Gy
Plan Total Fractions Prescribed: 25
Plan Total Prescribed Dose: 45 Gy
Reference Point Dosage Given to Date: 36 Gy
Reference Point Session Dosage Given: 1.8 Gy
Session Number: 24

## 2022-11-23 MED ORDER — TAMSULOSIN HCL 0.4 MG PO CAPS
0.4000 mg | ORAL_CAPSULE | Freq: Every day | ORAL | 5 refills | Status: DC
  Filled 2022-11-23: qty 30, 30d supply, fill #0

## 2022-11-25 ENCOUNTER — Emergency Department (HOSPITAL_COMMUNITY)
Admission: EM | Admit: 2022-11-25 | Discharge: 2022-11-25 | Disposition: A | Discharge status: Home / Self Care | Payer: No Typology Code available for payment source | Attending: Emergency Medicine | Admitting: Emergency Medicine

## 2022-11-25 ENCOUNTER — Encounter (HOSPITAL_COMMUNITY): Payer: Self-pay

## 2022-11-25 ENCOUNTER — Other Ambulatory Visit: Payer: Self-pay

## 2022-11-25 DIAGNOSIS — I10 Essential (primary) hypertension: Secondary | ICD-10-CM | POA: Diagnosis not present

## 2022-11-25 DIAGNOSIS — K649 Unspecified hemorrhoids: Secondary | ICD-10-CM | POA: Diagnosis present

## 2022-11-25 DIAGNOSIS — C61 Malignant neoplasm of prostate: Secondary | ICD-10-CM | POA: Insufficient documentation

## 2022-11-25 DIAGNOSIS — Z79899 Other long term (current) drug therapy: Secondary | ICD-10-CM | POA: Insufficient documentation

## 2022-11-25 DIAGNOSIS — K644 Residual hemorrhoidal skin tags: Secondary | ICD-10-CM | POA: Insufficient documentation

## 2022-11-25 DIAGNOSIS — J449 Chronic obstructive pulmonary disease, unspecified: Secondary | ICD-10-CM | POA: Insufficient documentation

## 2022-11-25 HISTORY — DX: Malignant (primary) neoplasm, unspecified: C80.1

## 2022-11-25 LAB — CBC
HCT: 35.7 % — ABNORMAL LOW (ref 39.0–52.0)
Hemoglobin: 12.4 g/dL — ABNORMAL LOW (ref 13.0–17.0)
MCH: 29.5 pg (ref 26.0–34.0)
MCHC: 34.7 g/dL (ref 30.0–36.0)
MCV: 84.8 fL (ref 80.0–100.0)
Platelets: 123 10*3/uL — ABNORMAL LOW (ref 150–400)
RBC: 4.21 MIL/uL — ABNORMAL LOW (ref 4.22–5.81)
RDW: 14.8 % (ref 11.5–15.5)
WBC: 7 10*3/uL (ref 4.0–10.5)
nRBC: 0 % (ref 0.0–0.2)

## 2022-11-25 LAB — BASIC METABOLIC PANEL
Anion gap: 13 (ref 5–15)
BUN: 6 mg/dL — ABNORMAL LOW (ref 8–23)
CO2: 24 mmol/L (ref 22–32)
Calcium: 9 mg/dL (ref 8.9–10.3)
Chloride: 93 mmol/L — ABNORMAL LOW (ref 98–111)
Creatinine, Ser: 0.62 mg/dL (ref 0.61–1.24)
GFR, Estimated: >60 mL/min (ref >60)
Glucose, Bld: 102 mg/dL — ABNORMAL HIGH (ref 70–99)
Potassium: 3.1 mmol/L — ABNORMAL LOW (ref 3.5–5.1)
Sodium: 130 mmol/L — ABNORMAL LOW (ref 135–145)

## 2022-11-25 MED ORDER — LIDOCAINE 5 % EX OINT: 1.0000 | TOPICAL_OINTMENT | CUTANEOUS | 0 refills | Status: DC | PRN

## 2022-11-25 MED ORDER — LIDOCAINE HCL URETHRAL/MUCOSAL 2 % EX GEL
1.0000 | Freq: Once | CUTANEOUS | Status: AC
  Administered 2022-11-25: 1 via TOPICAL
  Filled 2022-11-25: qty 11

## 2022-11-25 MED ORDER — AMLODIPINE BESYLATE 5 MG PO TABS
5.0000 mg | ORAL_TABLET | Freq: Once | ORAL | Status: AC
  Administered 2022-11-25: 5 mg via ORAL
  Filled 2022-11-25: qty 1

## 2022-11-25 MED ORDER — HYDROCODONE-ACETAMINOPHEN 5-325 MG PO TABS
1.0000 | ORAL_TABLET | Freq: Once | ORAL | Status: AC
  Administered 2022-11-25: 1 via ORAL
  Filled 2022-11-25: qty 1

## 2022-11-25 MED ORDER — CARVEDILOL 12.5 MG PO TABS
25.0000 mg | ORAL_TABLET | Freq: Two times a day (BID) | ORAL | Status: DC
  Administered 2022-11-25: 25 mg via ORAL
  Filled 2022-11-25: qty 2

## 2022-11-25 MED ORDER — POTASSIUM CHLORIDE CRYS ER 20 MEQ PO TBCR
40.0000 meq | EXTENDED_RELEASE_TABLET | Freq: Once | ORAL | Status: AC
  Administered 2022-11-25: 40 meq via ORAL
  Filled 2022-11-25: qty 2

## 2022-11-25 MED ORDER — HYDROCORTISONE (PERIANAL) 2.5 % EX CREA: 1.0000 | TOPICAL_CREAM | Freq: Two times a day (BID) | CUTANEOUS | 0 refills | Status: DC

## 2022-11-25 MED ORDER — POLYETHYLENE GLYCOL 3350 17 G PO PACK: 17.0000 g | PACK | Freq: Every day | ORAL | 0 refills | Status: DC

## 2022-11-25 NOTE — ED Triage Notes (Signed)
Pt c/o hemorrhoids that started again due to the radiation for prostate Cax1 mo. Pt states they are bleeding a little. Pt states he's having trouble with [id number]

## 2022-11-25 NOTE — Discharge Instructions (Signed)
Take the medications as prescribed to help with the pain associated with your hemorrhoids.  Continue warm sitz bath several times per day.  Follow-up with a GI doctor for further evaluation and treatment of your hemorrhoids.  Return to the ER for fevers worsening symptoms

## 2022-11-25 NOTE — ED Provider Notes (Signed)
[facility] Provider Note   CSN: [id number] Arrival date & time: 11/25/22  [id number]     History  Chief Complaint  Patient presents with   Hemorrhoids    Jason Rojas is a 63 y.o. male.  HPI   Patient has a history of COPD, hypertension, prostate cancer currently undergoing radiation treatment.  Pt states he has had issues with hemorrhoids for the last month.  Pt has been trying over the counter medications.  Pt had increasing pain in the rectal area this morning after having a bowel movement so he came to the ED. patient states he has noticed blood in his stool.  He denies any abdominal pain or vomiting.  He denies any fevers or chills.  Patient does have a history of hypertension but did not take his medications this morning.  Patient has not seen anyone specifically for the hemorrhoids and has just tried topical over-the-counter medications.  Home Medications Prior to Admission medications   Medication Sig Start Date End Date Taking? Authorizing Provider  hydrocortisone (ANUSOL-HC) 2.5 % rectal cream Place 1 Application rectally 2 (two) times daily. 11/25/22  Yes Dorie Rank, MD  lidocaine (XYLOCAINE) 5 % ointment Apply 1 Application topically as needed for moderate pain. 11/25/22  Yes Dorie Rank, MD  polyethylene glycol (MIRALAX) 17 g packet Take 17 g by mouth daily. 11/25/22  Yes Dorie Rank, MD  amLODipine (NORVASC) 5 MG tablet Take 5 mg by mouth daily.     [provider]  carvedilol (COREG) 25 MG tablet Take 12.5 mg by mouth 2 (two) times daily with a meal.    [provider]  diphenhydrAMINE (BENADRYL) 25 MG tablet Take 1 tablet (25 mg total) by mouth every 6 (six) hours as needed. 04/17/20   Petrucelli, Samantha R, PA-C  gabapentin (NEURONTIN) 800 MG tablet Take 800 mg by mouth 3 (three) times daily as needed (for pain/nerves).     [provider]  hydrocortisone (ANUSOL-HC) 25 MG suppository Unwrap and place 1  suppository (25 mg total) rectally 2 (two) times daily. 11/08/22   Tyler Pita, MD  ibuprofen (ADVIL) 600 MG tablet Take 600 mg by mouth 3 (three) times daily.     [provider]  predniSONE (DELTASONE) 10 MG tablet Take 40 mg (4 tablets) for the first 5 days then 20 mg (2 tablets) for 5 days. 04/17/20   Petrucelli, Samantha R, PA-C  sildenafil (VIAGRA) 100 MG tablet Take 100 mg by mouth daily as needed for erectile dysfunction.    [provider]  tamsulosin (FLOMAX) 0.4 MG CAPS capsule Take 1 capsule (0.4 mg total) by mouth daily after supper. 11/23/22   Tyler Pita, MD      Allergies    Tuberculin purified protein derivative    Review of Systems   Review of Systems  Physical Exam Updated Vital Signs BP 137/82   Pulse 80   Temp 97.8 F (36.6 C) (Oral)   Resp 16   Ht 1.702 m ('5\' 7"'$ )   Wt 77.5 kg   SpO2 100%   BMI 26.76 kg/m  Physical Exam Vitals and nursing note reviewed.  Constitutional:      General: He is not in acute distress.    Appearance: He is well-developed.  HENT:     Head: Normocephalic and atraumatic.     Right Ear: External ear normal.     Left Ear: External ear normal.  Eyes:     General: No  scleral icterus.       Right eye: No discharge.        Left eye: No discharge.     Conjunctiva/sclera: Conjunctivae normal.  Neck:     Trachea: No tracheal deviation.  Cardiovascular:     Rate and Rhythm: Normal rate.  Pulmonary:     Effort: Pulmonary effort is normal. No respiratory distress.     Breath sounds: No stridor.  Abdominal:     General: There is no distension.     Tenderness: There is no abdominal tenderness. There is no guarding.  Genitourinary:    Rectum: Tenderness and external hemorrhoid present.     Comments: Erythematous hemorrhoid with friable mucosal tissue, blood-tinged stool noted around the hemorrhoid Musculoskeletal:        General: No swelling or deformity.     Cervical back: Neck supple.  Skin:    General:  Skin is warm and dry.     Findings: No rash.  Neurological:     Mental Status: He is alert. Mental status is at baseline.     Cranial Nerves: No dysarthria or facial asymmetry.     Motor: No seizure activity.     ED Results / Procedures / Treatments   Labs (all labs ordered are listed, but only abnormal results are displayed) Labs Reviewed  CBC - Abnormal; Notable for the following components:      Result Value   RBC 4.21 (*)    Hemoglobin 12.4 (*)    HCT 35.7 (*)    Platelets 123 (*)    All other components within normal limits  BASIC METABOLIC PANEL - Abnormal; Notable for the following components:   Sodium 130 (*)    Potassium 3.1 (*)    Chloride 93 (*)    Glucose, Bld 102 (*)    BUN 6 (*)    All other components within normal limits    EKG None  Radiology No results found.  Procedures Procedures    Medications Ordered in ED Medications  carvedilol (COREG) tablet 25 mg (25 mg Oral Given 11/25/22 0801)  amLODipine (NORVASC) tablet 5 mg (5 mg Oral Given 11/25/22 0801)  HYDROcodone-acetaminophen (NORCO/VICODIN) 5-325 MG per tablet 1 tablet (1 tablet Oral Given 11/25/22 0801)  lidocaine (XYLOCAINE) 2 % jelly 1 Application (1 Application Topical Given 11/25/22 0808)  potassium chloride SA (KLOR-CON M) CR tablet 40 mEq (40 mEq Oral Given 11/25/22 0855)    ED Course/ Medical Decision Making/ A&P Clinical Course as of 11/25/22 0906  Sun Nov 25, 2022  0850 CBC(!) Hemoglobin slightly decreased compared to previous.  No leukocytosis [JK]  [id number] Basic metabolic panel(!) Metabolic panel does show decreased sodium and potassium [JK]    Clinical Course User Index [JK] Dorie Rank, MD                             Medical Decision Making Frontal diagnosis includes but not limited to hemorrhoids, anal fissure, rectal mass, colitis, diverticulitis  Problems Addressed: External hemorrhoids: acute illness or injury  Amount and/or Complexity of Data Reviewed Labs: ordered.  Decision-making details documented in ED Course.  Risk Prescription drug management.   Patient presented to the ER with complaints of rectal pain and blood in his stool.  On exam patient has a friable tender external hemorrhoid.  No mass noted on exam.  Laboratory test show mild anemia but not severe decrease compared to previous values.  Electrolyte panel notable for  mild hypokalemia.  Incidental blood patient was treated with oral potassium.  Doubt colitis diverticulitis mass or acute infection abscess.  Consistent with an external hemorrhoid.  Will discharge home with medications for pain discomfort.  Outpatient follow-up with gastroenterology.  Also discussed sitz bath's and hygiene to help avoid irritation of the hemorrhoid.   Patient also noted to be hypertensive.  He had not taken his medications.  All blood pressure medications given that his blood pressure has improved       Final Clinical Impression(s) / ED Diagnoses Final diagnoses:  External hemorrhoids  Hypertension, unspecified type    Rx / DC Orders ED Discharge Orders          Ordered    lidocaine (XYLOCAINE) 5 % ointment  As needed        11/25/22 0902    hydrocortisone (ANUSOL-HC) 2.5 % rectal cream  2 times daily        11/25/22 0902    polyethylene glycol (MIRALAX) 17 g packet  Daily        11/25/22 0902              Dorie Rank, MD 11/25/22 [phone]

## 2022-11-26 ENCOUNTER — Other Ambulatory Visit: Payer: Self-pay

## 2022-11-26 ENCOUNTER — Ambulatory Visit
Admission: RE | Admit: 2022-11-26 | Discharge: 2022-11-26 | Disposition: A | Discharge status: Home / Self Care | Payer: No Typology Code available for payment source | Source: Ambulatory Visit | Attending: Radiation Oncology

## 2022-11-26 DIAGNOSIS — C61 Malignant neoplasm of prostate: Secondary | ICD-10-CM | POA: Diagnosis not present

## 2022-11-26 LAB — RAD ONC ARIA SESSION SUMMARY
Course Elapsed Days: 28
Plan Fractions Treated to Date: 21
Plan Prescribed Dose Per Fraction: 1.8 Gy
Plan Total Fractions Prescribed: 25
Plan Total Prescribed Dose: 45 Gy
Reference Point Dosage Given to Date: 37.8 Gy
Reference Point Session Dosage Given: 1.8 Gy
Session Number: 25

## 2022-11-27 ENCOUNTER — Other Ambulatory Visit: Payer: Self-pay

## 2022-11-27 ENCOUNTER — Ambulatory Visit
Admission: RE | Admit: 2022-11-27 | Discharge: 2022-11-27 | Disposition: A | Discharge status: Home / Self Care | Payer: No Typology Code available for payment source | Source: Ambulatory Visit | Attending: Radiation Oncology

## 2022-11-27 DIAGNOSIS — C61 Malignant neoplasm of prostate: Secondary | ICD-10-CM | POA: Diagnosis not present

## 2022-11-27 LAB — RAD ONC ARIA SESSION SUMMARY
Course Elapsed Days: 29
Plan Fractions Treated to Date: 22
Plan Prescribed Dose Per Fraction: 1.8 Gy
Plan Total Fractions Prescribed: 25
Plan Total Prescribed Dose: 45 Gy
Reference Point Dosage Given to Date: 39.6 Gy
Reference Point Session Dosage Given: 1.8 Gy
Session Number: 26

## 2022-11-28 ENCOUNTER — Ambulatory Visit: Payer: No Typology Code available for payment source

## 2022-11-29 ENCOUNTER — Other Ambulatory Visit: Payer: Self-pay

## 2022-11-29 ENCOUNTER — Ambulatory Visit
Admission: RE | Admit: 2022-11-29 | Discharge: 2022-11-29 | Disposition: A | Discharge status: Home / Self Care | Payer: No Typology Code available for payment source | Source: Ambulatory Visit | Attending: Radiation Oncology | Admitting: Radiation Oncology

## 2022-11-29 DIAGNOSIS — C61 Malignant neoplasm of prostate: Secondary | ICD-10-CM | POA: Diagnosis not present

## 2022-11-29 LAB — RAD ONC ARIA SESSION SUMMARY
Course Elapsed Days: 31
Plan Fractions Treated to Date: 23
Plan Prescribed Dose Per Fraction: 1.8 Gy
Plan Total Fractions Prescribed: 25
Plan Total Prescribed Dose: 45 Gy
Reference Point Dosage Given to Date: 41.4 Gy
Reference Point Session Dosage Given: 1.8 Gy
Session Number: 27

## 2022-11-30 ENCOUNTER — Ambulatory Visit
Admission: RE | Admit: 2022-11-30 | Discharge: 2022-11-30 | Disposition: A | Discharge status: Home / Self Care | Payer: No Typology Code available for payment source | Source: Ambulatory Visit | Attending: Radiation Oncology | Admitting: Radiation Oncology

## 2022-11-30 ENCOUNTER — Other Ambulatory Visit (HOSPITAL_COMMUNITY): Payer: Self-pay

## 2022-11-30 ENCOUNTER — Ambulatory Visit: Payer: No Typology Code available for payment source

## 2022-11-30 ENCOUNTER — Other Ambulatory Visit: Payer: Self-pay

## 2022-11-30 DIAGNOSIS — C61 Malignant neoplasm of prostate: Secondary | ICD-10-CM | POA: Insufficient documentation

## 2022-11-30 LAB — RAD ONC ARIA SESSION SUMMARY
Course Elapsed Days: 32
Plan Fractions Treated to Date: 24
Plan Prescribed Dose Per Fraction: 1.8 Gy
Plan Total Fractions Prescribed: 25
Plan Total Prescribed Dose: 45 Gy
Reference Point Dosage Given to Date: 43.2 Gy
Reference Point Session Dosage Given: 1.8 Gy
Session Number: 28

## 2022-12-03 ENCOUNTER — Other Ambulatory Visit: Payer: Self-pay

## 2022-12-03 ENCOUNTER — Ambulatory Visit
Admission: RE | Admit: 2022-12-03 | Discharge: 2022-12-03 | Disposition: A | Discharge status: Home / Self Care | Payer: No Typology Code available for payment source | Source: Ambulatory Visit | Attending: Radiation Oncology | Admitting: Radiation Oncology

## 2022-12-03 ENCOUNTER — Ambulatory Visit: Payer: No Typology Code available for payment source | Admitting: Radiation Oncology

## 2022-12-03 DIAGNOSIS — C61 Malignant neoplasm of prostate: Secondary | ICD-10-CM | POA: Diagnosis not present

## 2022-12-03 LAB — RAD ONC ARIA SESSION SUMMARY
Course Elapsed Days: 35
Plan Fractions Treated to Date: 25
Plan Prescribed Dose Per Fraction: 1.8 Gy
Plan Total Fractions Prescribed: 25
Plan Total Prescribed Dose: 45 Gy
Reference Point Dosage Given to Date: 45 Gy
Reference Point Session Dosage Given: 1.8 Gy
Session Number: 29

## 2022-12-04 ENCOUNTER — Ambulatory Visit: Payer: No Typology Code available for payment source

## 2022-12-04 ENCOUNTER — Other Ambulatory Visit: Payer: Self-pay

## 2022-12-04 ENCOUNTER — Ambulatory Visit
Admission: RE | Admit: 2022-12-04 | Discharge: 2022-12-04 | Disposition: A | Discharge status: Home / Self Care | Payer: No Typology Code available for payment source | Source: Ambulatory Visit | Attending: Radiation Oncology | Admitting: Radiation Oncology

## 2022-12-04 DIAGNOSIS — C61 Malignant neoplasm of prostate: Secondary | ICD-10-CM | POA: Diagnosis not present

## 2022-12-04 LAB — RAD ONC ARIA SESSION SUMMARY
Course Elapsed Days: 36
Plan Fractions Treated to Date: 1
Plan Prescribed Dose Per Fraction: 2 Gy
Plan Total Fractions Prescribed: 15
Plan Total Prescribed Dose: 30 Gy
Reference Point Dosage Given to Date: 2 Gy
Reference Point Session Dosage Given: 2 Gy
Session Number: 30

## 2022-12-05 ENCOUNTER — Other Ambulatory Visit: Payer: Self-pay

## 2022-12-05 ENCOUNTER — Ambulatory Visit
Admission: RE | Admit: 2022-12-05 | Discharge: 2022-12-05 | Disposition: A | Discharge status: Home / Self Care | Payer: No Typology Code available for payment source | Source: Ambulatory Visit | Attending: Radiation Oncology | Admitting: Radiation Oncology

## 2022-12-05 ENCOUNTER — Ambulatory Visit: Payer: No Typology Code available for payment source

## 2022-12-05 DIAGNOSIS — C61 Malignant neoplasm of prostate: Secondary | ICD-10-CM

## 2022-12-05 LAB — RAD ONC ARIA SESSION SUMMARY
Course Elapsed Days: 37
Plan Fractions Treated to Date: 2
Plan Prescribed Dose Per Fraction: 2 Gy
Plan Total Fractions Prescribed: 15
Plan Total Prescribed Dose: 30 Gy
Reference Point Dosage Given to Date: 4 Gy
Reference Point Session Dosage Given: 2 Gy
Session Number: 31

## 2022-12-06 ENCOUNTER — Ambulatory Visit
Admission: RE | Admit: 2022-12-06 | Discharge: 2022-12-06 | Disposition: A | Discharge status: Home / Self Care | Payer: No Typology Code available for payment source | Source: Ambulatory Visit | Attending: Radiation Oncology | Admitting: Radiation Oncology

## 2022-12-06 ENCOUNTER — Other Ambulatory Visit: Payer: Self-pay

## 2022-12-06 ENCOUNTER — Ambulatory Visit
Admission: RE | Admit: 2022-12-06 | Discharge: 2022-12-06 | Discharge status: Home / Self Care | Payer: No Typology Code available for payment source | Source: Ambulatory Visit | Attending: Radiation Oncology

## 2022-12-06 DIAGNOSIS — C61 Malignant neoplasm of prostate: Secondary | ICD-10-CM | POA: Diagnosis not present

## 2022-12-06 LAB — RAD ONC ARIA SESSION SUMMARY
Course Elapsed Days: 38
Plan Fractions Treated to Date: 3
Plan Prescribed Dose Per Fraction: 2 Gy
Plan Total Fractions Prescribed: 15
Plan Total Prescribed Dose: 30 Gy
Reference Point Dosage Given to Date: 6 Gy
Reference Point Session Dosage Given: 2 Gy
Session Number: 32

## 2022-12-07 ENCOUNTER — Other Ambulatory Visit: Payer: Self-pay

## 2022-12-07 ENCOUNTER — Ambulatory Visit
Admission: RE | Admit: 2022-12-07 | Discharge: 2022-12-07 | Disposition: A | Discharge status: Home / Self Care | Payer: No Typology Code available for payment source | Source: Ambulatory Visit | Attending: Radiation Oncology | Admitting: Radiation Oncology

## 2022-12-07 DIAGNOSIS — C61 Malignant neoplasm of prostate: Secondary | ICD-10-CM | POA: Diagnosis not present

## 2022-12-07 LAB — RAD ONC ARIA SESSION SUMMARY
Course Elapsed Days: 39
Plan Fractions Treated to Date: 4
Plan Prescribed Dose Per Fraction: 2 Gy
Plan Total Fractions Prescribed: 15
Plan Total Prescribed Dose: 30 Gy
Reference Point Dosage Given to Date: 8 Gy
Reference Point Session Dosage Given: 2 Gy
Session Number: 33

## 2022-12-07 NOTE — Progress Notes (Addendum)
RN left voicemail with [facility] Nurse Navigator to initiate his PCP @ the [facility] placing referral for GI due to painful hemorrhoids.    RN notified patient on status.   Will continue to follow.

## 2022-12-10 ENCOUNTER — Ambulatory Visit: Payer: No Typology Code available for payment source

## 2022-12-11 ENCOUNTER — Other Ambulatory Visit: Payer: Self-pay

## 2022-12-11 ENCOUNTER — Ambulatory Visit: Payer: No Typology Code available for payment source

## 2022-12-11 ENCOUNTER — Telehealth: Payer: Self-pay

## 2022-12-11 DIAGNOSIS — C61 Malignant neoplasm of prostate: Secondary | ICD-10-CM

## 2022-12-11 NOTE — Telephone Encounter (Signed)
RN attempting call to Jason Rojas Jason Rojas daughter to inform her of the need for Jason Rojas to continue radiation treatment to completion.  Call was unsuccessful voicemail box full unable to leave message.  RN tried to call Jason Rojas no one picking up phone.  Will try again later.

## 2022-12-11 NOTE — Telephone Encounter (Signed)
RN called Jason Rojas after identity confirmed spoke with him about missing 2 radiation treatments this week and expressed the importance of coming in tomorrow and for future treatments.  Jason Rojas said that he understands but just wasn't feeling well and now that he's feeling a little better he will be in tomorrow for treatment.  Jason Rojas verbalized that he may needs transportation to appointments.  RN reached out to [employer] to assist with rides.  Informed the radiation therapists of this collaboration.

## 2022-12-11 NOTE — Progress Notes (Signed)
[facility] received request regarding additional services for patient to be referred for evaluation of painful hemorrhoids.    Patient will be seen by his PCP through the [facility] on 3/15.

## 2022-12-11 NOTE — Progress Notes (Signed)
RN submitted additional services request to [facility] for consideration of referral for patient's hemorrhoids.    Will continue to follow.

## 2022-12-12 ENCOUNTER — Ambulatory Visit
Admission: RE | Admit: 2022-12-12 | Discharge: 2022-12-12 | Disposition: A | Discharge status: Home / Self Care | Payer: No Typology Code available for payment source | Source: Ambulatory Visit | Attending: Radiation Oncology | Admitting: Radiation Oncology

## 2022-12-12 ENCOUNTER — Other Ambulatory Visit: Payer: Self-pay

## 2022-12-12 DIAGNOSIS — C61 Malignant neoplasm of prostate: Secondary | ICD-10-CM | POA: Diagnosis not present

## 2022-12-12 LAB — RAD ONC ARIA SESSION SUMMARY
Course Elapsed Days: 44
Plan Fractions Treated to Date: 5
Plan Prescribed Dose Per Fraction: 2 Gy
Plan Total Fractions Prescribed: 15
Plan Total Prescribed Dose: 30 Gy
Reference Point Dosage Given to Date: 10 Gy
Reference Point Session Dosage Given: 2 Gy
Session Number: 34

## 2022-12-13 ENCOUNTER — Ambulatory Visit
Admission: RE | Admit: 2022-12-13 | Discharge: 2022-12-13 | Disposition: A | Discharge status: Home / Self Care | Payer: No Typology Code available for payment source | Source: Ambulatory Visit | Attending: Radiation Oncology | Admitting: Radiation Oncology

## 2022-12-13 ENCOUNTER — Inpatient Hospital Stay: Payer: No Typology Code available for payment source | Attending: Radiation Oncology

## 2022-12-13 ENCOUNTER — Other Ambulatory Visit: Payer: Self-pay

## 2022-12-13 ENCOUNTER — Ambulatory Visit
Admission: RE | Admit: 2022-12-13 | Discharge: 2022-12-13 | Discharge status: Home / Self Care | Payer: No Typology Code available for payment source | Source: Ambulatory Visit | Attending: Radiation Oncology

## 2022-12-13 DIAGNOSIS — C61 Malignant neoplasm of prostate: Secondary | ICD-10-CM | POA: Diagnosis not present

## 2022-12-13 LAB — RAD ONC ARIA SESSION SUMMARY
Course Elapsed Days: 45
Plan Fractions Treated to Date: 6
Plan Prescribed Dose Per Fraction: 2 Gy
Plan Total Fractions Prescribed: 15
Plan Total Prescribed Dose: 30 Gy
Reference Point Dosage Given to Date: 12 Gy
Reference Point Session Dosage Given: 2 Gy
Session Number: 35

## 2022-12-13 MED ORDER — SONAFINE EX EMUL
1.0000 | Freq: Two times a day (BID) | CUTANEOUS | Status: DC
  Administered 2022-12-13: 1 via TOPICAL

## 2022-12-13 NOTE — Progress Notes (Signed)
[facility] Work  Clinical Social Work was referred by Art therapist for assessment of psychosocial needs.  Clinical Social Worker contacted patient by phone to offer support and assess for needs.    Patient was getting ready for treatment so the conversation was limited.  He stated he receives services from the [facility] and [facility].  Since he became sick he moved in with his mother.  Patient said he does not have issues with food or transportation at this time.  Plan is for CSW to follow up with patient when he has more time to talk.     Margaree Mackintosh, LCSW  Clinical Social Worker [facility]        Patient is participating in a Managed Medicaid Plan:  Yes

## 2022-12-14 ENCOUNTER — Ambulatory Visit
Admission: RE | Admit: 2022-12-14 | Discharge: 2022-12-14 | Disposition: A | Discharge status: Home / Self Care | Payer: No Typology Code available for payment source | Source: Ambulatory Visit | Attending: Radiation Oncology | Admitting: Radiation Oncology

## 2022-12-14 ENCOUNTER — Other Ambulatory Visit: Payer: Self-pay

## 2022-12-14 DIAGNOSIS — C61 Malignant neoplasm of prostate: Secondary | ICD-10-CM | POA: Diagnosis not present

## 2022-12-14 LAB — RAD ONC ARIA SESSION SUMMARY
Course Elapsed Days: 46
Plan Fractions Treated to Date: 7
Plan Prescribed Dose Per Fraction: 2 Gy
Plan Total Fractions Prescribed: 15
Plan Total Prescribed Dose: 30 Gy
Reference Point Dosage Given to Date: 14 Gy
Reference Point Session Dosage Given: 2 Gy
Session Number: 36

## 2022-12-14 NOTE — Progress Notes (Signed)
Mr. Orleans was sent over by radiation therapist that his bladder was too full.  Mr. Sullinger came over to nursing reports able to urinate and that he had urinated once off  radiation table and once he came to nursing urinated again.  Mr. Bein was asked if he could give sample he said no and refused to stay.  RN advised Mr. Masciarelli to call if symptoms worsen or come back before 3 pm to give urine sample.  He said he understands and will call if need.  He left the clinic in a hurry.

## 2022-12-14 NOTE — Progress Notes (Signed)
RN was notified of patient having suspected urinary retention by Ashlyn, PA-C.    Patient was here for his daily radiation treatment and PA was notified of enlarged bladder.   RN contacted [facility] in [place] and notified them of patient with possible urinary retention.  Verbalized to send patient over and he would be seen and evaluated ASAP.   RN coordinated urgent transportation with approval from Alphonzo Grieve for patient to go to and from [facility] in [place].   RN had patient sign transportation wavier, and informed patient this was just for this appointment.  Moving forward patient would need to continue to utilize his insurance coverage for transportation services to his daily treatments.  Verbalized understanding.

## 2022-12-15 ENCOUNTER — Other Ambulatory Visit: Payer: Self-pay

## 2022-12-15 ENCOUNTER — Emergency Department (HOSPITAL_COMMUNITY)
Admission: EM | Admit: 2022-12-15 | Discharge: 2022-12-15 | Disposition: A | Discharge status: Home / Self Care | Payer: No Typology Code available for payment source | Attending: Emergency Medicine | Admitting: Emergency Medicine

## 2022-12-15 ENCOUNTER — Encounter (HOSPITAL_COMMUNITY): Payer: Self-pay

## 2022-12-15 DIAGNOSIS — T83098A Other mechanical complication of other indwelling urethral catheter, initial encounter: Secondary | ICD-10-CM | POA: Insufficient documentation

## 2022-12-15 DIAGNOSIS — Y658 Other specified misadventures during surgical and medical care: Secondary | ICD-10-CM | POA: Diagnosis not present

## 2022-12-15 DIAGNOSIS — T839XXA Unspecified complication of genitourinary prosthetic device, implant and graft, initial encounter: Secondary | ICD-10-CM

## 2022-12-15 NOTE — ED Provider Notes (Signed)
[facility] Provider Note   CSN: [id number] Arrival date & time: 12/15/22  1526     History  Chief Complaint  Patient presents with   foley pain    Jason Rojas is a 62 y.o. male.  HPI   This patient is a 62 year old male with metastatic prostate cancer to the bone with a history of urinary retention requiring a urinary catheter to be placed at the [facility] or clinic in [place] yesterday.  Since that time he has had some discomfort in the end of his penis, he does have some blood in the urinary bag, no fevers or chills, mild suprapubic discomfort.  Not altogether different than he has been in the past other than the penile discomfort with the catheter.  Home Medications Prior to Admission medications   Medication Sig Start Date End Date Taking? Authorizing Provider  amLODipine (NORVASC) 5 MG tablet Take 5 mg by mouth daily.     [provider]  carvedilol (COREG) 25 MG tablet Take 12.5 mg by mouth 2 (two) times daily with a meal.    [provider]  diphenhydrAMINE (BENADRYL) 25 MG tablet Take 1 tablet (25 mg total) by mouth every 6 (six) hours as needed. 04/17/20   Petrucelli, Samantha R, PA-C  gabapentin (NEURONTIN) 800 MG tablet Take 800 mg by mouth 3 (three) times daily as needed (for pain/nerves).     [provider]  hydrocortisone (ANUSOL-HC) 2.5 % rectal cream Place 1 Application rectally 2 (two) times daily. 11/25/22   Dorie Rank, MD  hydrocortisone (ANUSOL-HC) 25 MG suppository Unwrap and place 1 suppository (25 mg total) rectally 2 (two) times daily. 11/08/22   Tyler Pita, MD  ibuprofen (ADVIL) 600 MG tablet Take 600 mg by mouth 3 (three) times daily.     [provider]  lidocaine (XYLOCAINE) 5 % ointment Apply 1 Application topically as needed for moderate pain. 11/25/22   Dorie Rank, MD  polyethylene glycol (MIRALAX) 17 g packet Take 17 g by mouth daily. 11/25/22   Dorie Rank, MD   predniSONE (DELTASONE) 10 MG tablet Take 40 mg (4 tablets) for the first 5 days then 20 mg (2 tablets) for 5 days. 04/17/20   Petrucelli, Samantha R, PA-C  sildenafil (VIAGRA) 100 MG tablet Take 100 mg by mouth daily as needed for erectile dysfunction.    [provider]  tamsulosin (FLOMAX) 0.4 MG CAPS capsule Take 1 capsule (0.4 mg total) by mouth daily after supper. 11/23/22   Tyler Pita, MD      Allergies    Tuberculin purified protein derivative    Review of Systems   Review of Systems  All other systems reviewed and are negative.   Physical Exam Updated Vital Signs BP (!) 150/86   Pulse 93   Temp 97.7 F (36.5 C)   Resp 18   Ht 1.702 m (5\' 7" )   Wt 77.1 kg   SpO2 96%   BMI 26.63 kg/m  Physical Exam Vitals and nursing note reviewed.  Constitutional:      General: He is not in acute distress.    Appearance: He is well-developed.  HENT:     Head: Normocephalic and atraumatic.     Mouth/Throat:     Pharynx: No oropharyngeal exudate.  Eyes:     General: No scleral icterus.       Right eye: No discharge.        Left eye: No discharge.  Conjunctiva/sclera: Conjunctivae normal.     Pupils: Pupils are equal, round, and reactive to light.  Neck:     Thyroid: No thyromegaly.     Vascular: No JVD.  Cardiovascular:     Rate and Rhythm: Normal rate and regular rhythm.     Heart sounds: Normal heart sounds. No murmur heard.    No friction rub. No gallop.  Pulmonary:     Effort: Pulmonary effort is normal. No respiratory distress.     Breath sounds: Normal breath sounds. No wheezing or rales.  Abdominal:     General: Bowel sounds are normal. There is no distension.     Palpations: Abdomen is soft. There is no mass.     Tenderness: There is no abdominal tenderness.  Genitourinary:    Comments: Patient has normal-appearing penis scrotum and testicles, urinary catheter is seated well in the urinary urethra, there is no blood or discharge at urethral meatus,  no tenderness along the shaft of the penis or the penile head.  There is some blood-tinged urine in the bag, he had emptied at 45 minutes prior to my evaluation and there is already approximately 100 cc in the bag Musculoskeletal:        General: No tenderness. Normal range of motion.     Cervical back: Normal range of motion and neck supple.  Lymphadenopathy:     Cervical: No cervical adenopathy.  Skin:    General: Skin is warm and dry.     Findings: No erythema or rash.  Neurological:     Mental Status: He is alert.     Coordination: Coordination normal.  Psychiatric:        Behavior: Behavior normal.     ED Results / Procedures / Treatments   Labs (all labs ordered are listed, but only abnormal results are displayed) Labs Reviewed - No data to display  EKG None  Radiology No results found.  Procedures Procedures    Medications Ordered in ED Medications - No data to display  ED Course/ Medical Decision Making/ A&P                             Medical Decision Making  Rule out urinary retention, bladder scan.  Otherwise patient has some discomfort in the actual catheter site but no signs of infection or tissue damage.  Small amount of blood in the bag but passing urine freely.  Can follow-up with [facility] urology clinic in [place].  Need for replacing the bag or irrigating the catheter  Catheter straightened, urinary retention evaluated, less than 50 cc in the bladder.  Draining spontaneously.  Patient educated on catheter use and maintenance, stable for discharge  Urinalysis was obtained at the [facility] clinic yesterday, no need for treatment of infection at this time        Final Clinical Impression(s) / ED Diagnoses Final diagnoses:  Problem with Foley catheter, initial encounter [facility])    Rx / DC Orders ED Discharge Orders     None         Noemi Chapel, MD 12/15/22 1656

## 2022-12-15 NOTE — ED Triage Notes (Signed)
Pt reports he had a foley placed at the [facility] in [place] on Friday and it has been hurting ever since.

## 2022-12-15 NOTE — Discharge Instructions (Signed)
Please follow-up with your [facility] clinic to have the catheter reassessed within 1 week if it is still bothering you.  Emergency department for severe or worsening symptoms

## 2022-12-15 NOTE — ED Notes (Signed)
Teaching and instruction given to patient regarding care of foley catheter. Patient verbalized understanding.

## 2022-12-17 ENCOUNTER — Other Ambulatory Visit: Payer: Self-pay

## 2022-12-17 ENCOUNTER — Ambulatory Visit
Admission: RE | Admit: 2022-12-17 | Discharge: 2022-12-17 | Disposition: A | Discharge status: Home / Self Care | Payer: No Typology Code available for payment source | Source: Ambulatory Visit | Attending: Radiation Oncology | Admitting: Radiation Oncology

## 2022-12-17 DIAGNOSIS — C61 Malignant neoplasm of prostate: Secondary | ICD-10-CM | POA: Diagnosis not present

## 2022-12-17 LAB — RAD ONC ARIA SESSION SUMMARY
Course Elapsed Days: 49
Plan Fractions Treated to Date: 8
Plan Prescribed Dose Per Fraction: 2 Gy
Plan Total Fractions Prescribed: 15
Plan Total Prescribed Dose: 30 Gy
Reference Point Dosage Given to Date: 16 Gy
Reference Point Session Dosage Given: 2 Gy
Session Number: 37

## 2022-12-17 NOTE — Progress Notes (Signed)
RN talked with Jason Rojas briefly after his radiation treatment this morning.  Jason Rojas wanted to talk to nurse about foley catheter bag.  On observation noted blood in urine.  Jason Rojas had reported going to [facility] hospital 12/14/2022 for urinary retention which is when the foley was placed and urine specimen done.  Jason Rojas reports having to go the [facility] 12/15/2022 because issues with the bag being twisted and not understanding how to empty it (see ED note).  Jason Rojas denies pain, fever, chills, or discomfort his leg bag which was in place and secured properly to right thigh.  Advised Jason Rojas to increase water intake to flush out any clots that could accumulate in bladder none observed in urine bag at this time.  Jason Rojas was advise to call if having any other concerns and for emergent issues to call 911.  Jason Rojas verbalized understanding and thanked this Probation officer for talking with him and left facility.

## 2022-12-18 ENCOUNTER — Ambulatory Visit
Admission: RE | Admit: 2022-12-18 | Discharge: 2022-12-18 | Disposition: A | Discharge status: Home / Self Care | Payer: No Typology Code available for payment source | Source: Ambulatory Visit | Attending: Radiation Oncology

## 2022-12-18 ENCOUNTER — Other Ambulatory Visit: Payer: Self-pay

## 2022-12-18 DIAGNOSIS — C61 Malignant neoplasm of prostate: Secondary | ICD-10-CM | POA: Diagnosis not present

## 2022-12-18 LAB — RAD ONC ARIA SESSION SUMMARY
Course Elapsed Days: 50
Plan Fractions Treated to Date: 9
Plan Prescribed Dose Per Fraction: 2 Gy
Plan Total Fractions Prescribed: 15
Plan Total Prescribed Dose: 30 Gy
Reference Point Dosage Given to Date: 18 Gy
Reference Point Session Dosage Given: 2 Gy
Session Number: 38

## 2022-12-19 ENCOUNTER — Other Ambulatory Visit: Payer: Self-pay

## 2022-12-19 ENCOUNTER — Ambulatory Visit
Admission: RE | Admit: 2022-12-19 | Discharge: 2022-12-19 | Disposition: A | Discharge status: Home / Self Care | Payer: No Typology Code available for payment source | Source: Ambulatory Visit | Attending: Radiation Oncology | Admitting: Radiation Oncology

## 2022-12-19 DIAGNOSIS — C61 Malignant neoplasm of prostate: Secondary | ICD-10-CM | POA: Diagnosis not present

## 2022-12-19 LAB — RAD ONC ARIA SESSION SUMMARY
Course Elapsed Days: 51
Plan Fractions Treated to Date: 10
Plan Prescribed Dose Per Fraction: 2 Gy
Plan Total Fractions Prescribed: 15
Plan Total Prescribed Dose: 30 Gy
Reference Point Dosage Given to Date: 20 Gy
Reference Point Session Dosage Given: 2 Gy
Session Number: 39

## 2022-12-20 ENCOUNTER — Ambulatory Visit
Admission: RE | Admit: 2022-12-20 | Discharge: 2022-12-20 | Disposition: A | Discharge status: Home / Self Care | Payer: No Typology Code available for payment source | Source: Ambulatory Visit | Attending: Radiation Oncology | Admitting: Radiation Oncology

## 2022-12-20 ENCOUNTER — Other Ambulatory Visit: Payer: Self-pay

## 2022-12-20 DIAGNOSIS — C61 Malignant neoplasm of prostate: Secondary | ICD-10-CM | POA: Diagnosis not present

## 2022-12-20 LAB — RAD ONC ARIA SESSION SUMMARY
Course Elapsed Days: 52
Plan Fractions Treated to Date: 11
Plan Prescribed Dose Per Fraction: 2 Gy
Plan Total Fractions Prescribed: 15
Plan Total Prescribed Dose: 30 Gy
Reference Point Dosage Given to Date: 22 Gy
Reference Point Session Dosage Given: 2 Gy
Session Number: 40

## 2022-12-21 ENCOUNTER — Ambulatory Visit
Admission: RE | Admit: 2022-12-21 | Discharge: 2022-12-21 | Disposition: A | Discharge status: Home / Self Care | Payer: No Typology Code available for payment source | Source: Ambulatory Visit | Attending: Radiation Oncology | Admitting: Radiation Oncology

## 2022-12-21 ENCOUNTER — Other Ambulatory Visit: Payer: Self-pay

## 2022-12-21 DIAGNOSIS — C61 Malignant neoplasm of prostate: Secondary | ICD-10-CM | POA: Diagnosis not present

## 2022-12-21 LAB — RAD ONC ARIA SESSION SUMMARY
Course Elapsed Days: 53
Plan Fractions Treated to Date: 12
Plan Prescribed Dose Per Fraction: 2 Gy
Plan Total Fractions Prescribed: 15
Plan Total Prescribed Dose: 30 Gy
Reference Point Dosage Given to Date: 24 Gy
Reference Point Session Dosage Given: 2 Gy
Session Number: 41

## 2022-12-24 ENCOUNTER — Ambulatory Visit: Payer: No Typology Code available for payment source

## 2022-12-24 ENCOUNTER — Ambulatory Visit
Admission: RE | Admit: 2022-12-24 | Discharge: 2022-12-24 | Disposition: A | Discharge status: Home / Self Care | Payer: No Typology Code available for payment source | Source: Ambulatory Visit | Attending: Radiation Oncology

## 2022-12-24 ENCOUNTER — Other Ambulatory Visit: Payer: Self-pay

## 2022-12-24 DIAGNOSIS — C61 Malignant neoplasm of prostate: Secondary | ICD-10-CM | POA: Diagnosis not present

## 2022-12-24 LAB — RAD ONC ARIA SESSION SUMMARY
Course Elapsed Days: 56
Plan Fractions Treated to Date: 13
Plan Prescribed Dose Per Fraction: 2 Gy
Plan Total Fractions Prescribed: 15
Plan Total Prescribed Dose: 30 Gy
Reference Point Dosage Given to Date: 26 Gy
Reference Point Session Dosage Given: 2 Gy
Session Number: 42

## 2022-12-25 ENCOUNTER — Ambulatory Visit: Payer: No Typology Code available for payment source

## 2022-12-25 ENCOUNTER — Other Ambulatory Visit: Payer: Self-pay

## 2022-12-25 ENCOUNTER — Ambulatory Visit
Admission: RE | Admit: 2022-12-25 | Discharge: 2022-12-25 | Disposition: A | Discharge status: Home / Self Care | Payer: No Typology Code available for payment source | Source: Ambulatory Visit | Attending: Radiation Oncology

## 2022-12-25 ENCOUNTER — Ambulatory Visit
Admission: RE | Admit: 2022-12-25 | Discharge: 2022-12-25 | Disposition: A | Discharge status: Home / Self Care | Payer: No Typology Code available for payment source | Source: Ambulatory Visit | Attending: Radiation Oncology | Admitting: Radiation Oncology

## 2022-12-25 DIAGNOSIS — C61 Malignant neoplasm of prostate: Secondary | ICD-10-CM | POA: Diagnosis not present

## 2022-12-25 LAB — RAD ONC ARIA SESSION SUMMARY
Course Elapsed Days: 57
Plan Fractions Treated to Date: 14
Plan Prescribed Dose Per Fraction: 2 Gy
Plan Total Fractions Prescribed: 15
Plan Total Prescribed Dose: 30 Gy
Reference Point Dosage Given to Date: 28 Gy
Reference Point Session Dosage Given: 2 Gy
Session Number: 43

## 2022-12-26 ENCOUNTER — Encounter: Payer: Self-pay | Admitting: Urology

## 2022-12-26 ENCOUNTER — Other Ambulatory Visit: Payer: Self-pay

## 2022-12-26 ENCOUNTER — Ambulatory Visit: Payer: No Typology Code available for payment source

## 2022-12-26 ENCOUNTER — Ambulatory Visit
Admission: RE | Admit: 2022-12-26 | Discharge: 2022-12-26 | Disposition: A | Discharge status: Home / Self Care | Payer: No Typology Code available for payment source | Source: Ambulatory Visit | Attending: Radiation Oncology | Admitting: Radiation Oncology

## 2022-12-26 DIAGNOSIS — C61 Malignant neoplasm of prostate: Secondary | ICD-10-CM | POA: Diagnosis not present

## 2022-12-26 LAB — RAD ONC ARIA SESSION SUMMARY
Course Elapsed Days: 58
Plan Fractions Treated to Date: 15
Plan Prescribed Dose Per Fraction: 2 Gy
Plan Total Fractions Prescribed: 15
Plan Total Prescribed Dose: 30 Gy
Reference Point Dosage Given to Date: 30 Gy
Reference Point Session Dosage Given: 2 Gy
Session Number: 44

## 2022-12-26 NOTE — Progress Notes (Signed)
[name] completed final treatment today came around to see nurse to get leg bag switched over to other leg.  RN switched out leg bag and changed position from right leg to left leg as requested by [name].  [name] was appreciative for the assistance.  [name] asked for number to [facility] clinic in [place] so that he can follow-up with Dr. Tamala Julian on when he will be getting the foley catheter removed.  Ambulating with cane out of the  clinic without difficulty.  Advised if need [place] to call [phone].

## 2022-12-27 NOTE — Progress Notes (Signed)
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  Patient proceed with treatment recommendations of 8 week course of external beam therapy concurrent with LT-ADT and total androgen blockade.  Pt had his final radiation treatment on 12/26/2022.   Patient is scheduled for a post treatment nurse call on 01/29/2023 and has a follow up at the [facility] on 4/4.  RN spoke with patient and provided education on importance of keeping urology follow up's for void trial and PSA monitoring.  RN encouraged patient to reach out if he had questions or concerns or trouble with future appointments at [facility].

## 2023-01-01 ENCOUNTER — Emergency Department (HOSPITAL_COMMUNITY)
Admission: EM | Admit: 2023-01-01 | Discharge: 2023-01-01 | Disposition: A | Discharge status: Home / Self Care | Payer: No Typology Code available for payment source | Attending: Emergency Medicine | Admitting: Emergency Medicine

## 2023-01-01 ENCOUNTER — Encounter (HOSPITAL_COMMUNITY): Payer: Self-pay

## 2023-01-01 ENCOUNTER — Other Ambulatory Visit: Payer: Self-pay

## 2023-01-01 DIAGNOSIS — J449 Chronic obstructive pulmonary disease, unspecified: Secondary | ICD-10-CM | POA: Diagnosis not present

## 2023-01-01 DIAGNOSIS — F17210 Nicotine dependence, cigarettes, uncomplicated: Secondary | ICD-10-CM | POA: Diagnosis not present

## 2023-01-01 DIAGNOSIS — N390 Urinary tract infection, site not specified: Secondary | ICD-10-CM | POA: Insufficient documentation

## 2023-01-01 DIAGNOSIS — Y732 Prosthetic and other implants, materials and accessory gastroenterology and urology devices associated with adverse incidents: Secondary | ICD-10-CM | POA: Insufficient documentation

## 2023-01-01 DIAGNOSIS — I10 Essential (primary) hypertension: Secondary | ICD-10-CM | POA: Diagnosis not present

## 2023-01-01 DIAGNOSIS — T83511A Infection and inflammatory reaction due to indwelling urethral catheter, initial encounter: Secondary | ICD-10-CM | POA: Diagnosis not present

## 2023-01-01 DIAGNOSIS — Z8546 Personal history of malignant neoplasm of prostate: Secondary | ICD-10-CM | POA: Diagnosis not present

## 2023-01-01 DIAGNOSIS — N4889 Other specified disorders of penis: Secondary | ICD-10-CM | POA: Diagnosis present

## 2023-01-01 LAB — URINALYSIS, W/ REFLEX TO CULTURE (INFECTION SUSPECTED)
Bilirubin Urine: NEGATIVE
Glucose, UA: NEGATIVE mg/dL
Ketones, ur: NEGATIVE mg/dL
Nitrite: POSITIVE — AB
Protein, ur: 30 mg/dL — AB
Specific Gravity, Urine: 1.004 — ABNORMAL LOW (ref 1.005–1.030)
pH: 6 (ref 5.0–8.0)

## 2023-01-01 MED ORDER — LIDOCAINE HCL (PF) 1 % IJ SOLN
2.0000 mL | Freq: Once | INTRAMUSCULAR | Status: AC
  Administered 2023-01-01: 2 mL via INTRADERMAL
  Filled 2023-01-01: qty 5

## 2023-01-01 MED ORDER — LIDOCAINE HCL URETHRAL/MUCOSAL 2 % EX GEL
1.0000 | Freq: Once | CUTANEOUS | Status: AC
  Administered 2023-01-01: 1 via URETHRAL
  Filled 2023-01-01: qty 10

## 2023-01-01 MED ORDER — CEFDINIR 300 MG PO CAPS: 300.0000 mg | ORAL_CAPSULE | Freq: Two times a day (BID) | ORAL | 0 refills | Status: AC

## 2023-01-01 MED ORDER — CEFTRIAXONE SODIUM 1 G IJ SOLR
1.0000 g | Freq: Once | INTRAMUSCULAR | Status: AC
  Administered 2023-01-01: 1 g via INTRAMUSCULAR
  Filled 2023-01-01: qty 10

## 2023-01-01 MED ORDER — OXYCODONE-ACETAMINOPHEN 5-325 MG PO TABS
1.0000 | ORAL_TABLET | Freq: Once | ORAL | Status: AC
  Administered 2023-01-01: 1 via ORAL
  Filled 2023-01-01: qty 1

## 2023-01-01 NOTE — Discharge Instructions (Signed)
We evaluated you for your penile pain.   We replaced your catheter with improvement in your symptoms.  Your urine testing shows signs of a urinary infection which likely contributed to your symptoms.  We gave you a shot of antibiotics in the emergency department.  I have also sent a prescription for antibiotics.  Please pick these up and take these as prescribed.  Please call your urologist for follow-up.  Please return if you develop any fevers or chills, vomiting, lightheadedness or dizziness, back pain, abdominal pain, or any other new symptoms.

## 2023-01-01 NOTE — ED Notes (Signed)
Patient verbalizes understanding of discharge instructions. Opportunity for questioning and answers were provided. Armband removed by staff, pt discharged from ED. Wheeled out to lobby and driven home by uncle

## 2023-01-01 NOTE — ED Provider Notes (Signed)
[facility] Provider Note  CSN: [id number] Arrival date & time: 01/01/23 1742  Chief Complaint(s) Penis Pain  HPI Jason Rojas is a 62 y.o. male with history of prostate cancer, COPD, presenting to the emergency department with penile pain.  He reports that he has had a Foley catheter for around 1 month.  He is not sure why he had the catheter placed.  He does not know how long he supposed to have it for.  He reports that he thinks he was unable to urinate.  He reports that he has had pain for the last day.  He reports some discharge around the catheter.  No fevers or chills, no abdominal pain or flank pain.  No nausea or vomiting.  He thinks maybe it was tugged on.   Past Medical History Past Medical History:  Diagnosis Date   Cancer    prostate   Chronic back pain    COPD (chronic obstructive pulmonary disease)    Depression    Hypertension    PTSD (post-traumatic stress disorder)    Sleep apnea    Patient Active Problem List   Diagnosis Date Noted   Neoplasm of prostate, distant metastasis staging category M1b: metastasis to bone 10/17/2022   Malignant neoplasm of prostate 09/04/2022   History of colonic polyps 11/24/2018   Loss of weight 11/24/2018   Home Medication(s) Prior to Admission medications   Medication Sig Start Date End Date Taking? Authorizing Provider  cefdinir (OMNICEF) 300 MG capsule Take 1 capsule (300 mg total) by mouth 2 (two) times daily for 10 days. 01/01/23 01/11/23 Yes Cristie Hem, MD  amLODipine (NORVASC) 5 MG tablet Take 5 mg by mouth daily.     [provider]  carvedilol (COREG) 25 MG tablet Take 12.5 mg by mouth 2 (two) times daily with a meal.    [provider]  diphenhydrAMINE (BENADRYL) 25 MG tablet Take 1 tablet (25 mg total) by mouth every 6 (six) hours as needed. 04/17/20   Petrucelli, Samantha R, PA-C  gabapentin (NEURONTIN) 800 MG tablet Take 800 mg by mouth 3 (three) times  daily as needed (for pain/nerves).     [provider]  hydrocortisone (ANUSOL-HC) 2.5 % rectal cream Place 1 Application rectally 2 (two) times daily. 11/25/22   Dorie Rank, MD  hydrocortisone (ANUSOL-HC) 25 MG suppository Unwrap and place 1 suppository (25 mg total) rectally 2 (two) times daily. 11/08/22   Tyler Pita, MD  ibuprofen (ADVIL) 600 MG tablet Take 600 mg by mouth 3 (three) times daily.     [provider]  lidocaine (XYLOCAINE) 5 % ointment Apply 1 Application topically as needed for moderate pain. 11/25/22   Dorie Rank, MD  polyethylene glycol (MIRALAX) 17 g packet Take 17 g by mouth daily. 11/25/22   Dorie Rank, MD  predniSONE (DELTASONE) 10 MG tablet Take 40 mg (4 tablets) for the first 5 days then 20 mg (2 tablets) for 5 days. 04/17/20   Petrucelli, Samantha R, PA-C  sildenafil (VIAGRA) 100 MG tablet Take 100 mg by mouth daily as needed for erectile dysfunction.    [provider]  tamsulosin (FLOMAX) 0.4 MG CAPS capsule Take 1 capsule (0.4 mg total) by mouth daily after supper. 11/23/22   Tyler Pita, MD  Past Surgical History Past Surgical History:  Procedure Laterality Date   APPENDECTOMY     BIOPSY  03/23/2019   Procedure: BIOPSY;  Surgeon: Daneil Dolin, MD;  [facility]: [facility];  Service: Endoscopy;;  gastric   COLONOSCOPY     2010 and 2015, adenomas   COLONOSCOPY WITH PROPOFOL N/A 03/23/2019   Procedure: COLONOSCOPY WITH PROPOFOL;  Surgeon: Daneil Dolin, MD;  Location: [facility];  Service: Endoscopy;  Laterality: N/A;  1:30pm   ESOPHAGOGASTRODUODENOSCOPY (EGD) WITH PROPOFOL N/A 03/23/2019   Procedure: ESOPHAGOGASTRODUODENOSCOPY (EGD) WITH PROPOFOL;  Surgeon: Daneil Dolin, MD;  Location: [facility];  Service: Endoscopy;  Laterality: N/A;   POLYPECTOMY  03/23/2019   Procedure: POLYPECTOMY;  Surgeon:  Daneil Dolin, MD;  Location: [facility];  Service: Endoscopy;;  colon   TONSILLECTOMY     Family History Family History  Problem Relation Age of Onset   Colon cancer Neg Hx     Social History Social History   Tobacco Use   Smoking status: Every Day    Packs/day: 1    Types: Cigarettes   Smokeless tobacco: Never  Vaping Use   Vaping Use: Never used  Substance Use Topics   Alcohol use: Yes    Alcohol/week: 35.0 standard drinks of alcohol    Types: 35 Cans of beer per week    Comment: 5 beers daily   Drug use: Not Currently    Types: Cocaine    Comment: history, none in 3-4 years    Allergies Tuberculin purified protein derivative  Review of Systems Review of Systems  All other systems reviewed and are negative.   Physical Exam Vital Signs  I have reviewed the triage vital signs BP (!) 149/78   Pulse 75   Temp 97.6 F (36.4 C) (Oral)   Resp 20   Ht 5\' 7"  (1.702 m)   Wt 80.7 kg   SpO2 100%   BMI 27.88 kg/m  Physical Exam Vitals and nursing note reviewed.  Constitutional:      General: He is not in acute distress.    Appearance: Normal appearance.  HENT:     Mouth/Throat:     Mouth: Mucous membranes are moist.  Eyes:     Conjunctiva/sclera: Conjunctivae normal.  Cardiovascular:     Rate and Rhythm: Normal rate and regular rhythm.  Pulmonary:     Effort: Pulmonary effort is normal. No respiratory distress.     Breath sounds: Normal breath sounds.  Abdominal:     General: Abdomen is flat.     Palpations: Abdomen is soft.     Tenderness: There is no abdominal tenderness. There is no right CVA tenderness or left CVA tenderness.  Genitourinary:    Comments: Chaperoned in by RN, Foley catheter in place, small amount of discharge around urethra Musculoskeletal:     Right lower leg: No edema.     Left lower leg: No edema.  Skin:    General: Skin is warm and dry.     Capillary Refill: Capillary refill takes less than 2 seconds.  Neurological:      Mental Status: He is alert and oriented to person, place, and time. Mental status is at baseline.  Psychiatric:        Mood and Affect: Mood normal.        Behavior: Behavior normal.     ED Results and Treatments Labs (all labs ordered are listed, but only abnormal results are displayed) Labs Reviewed  URINALYSIS, W/ REFLEX TO CULTURE (INFECTION SUSPECTED) - Abnormal; Notable for the following components:      Result Value   Color, Urine AMBER (*)    Specific Gravity, Urine 1.004 (*)    Hgb urine dipstick LARGE (*)    Protein, ur 30 (*)    Nitrite POSITIVE (*)    Leukocytes,Ua SMALL (*)    Bacteria, UA RARE (*)    All other components within normal limits  URINE CULTURE                                                                                                                          Radiology No results found.  Pertinent labs & imaging results that were available during my care of the patient were reviewed by me and considered in my medical decision making (see MDM for details).  Medications Ordered in ED Medications  lidocaine (XYLOCAINE) 2 % jelly 1 Application (1 Application Urethral Given 01/01/23 2119)  oxyCODONE-acetaminophen (PERCOCET/ROXICET) 5-325 MG per tablet 1 tablet (1 tablet Oral Given 01/01/23 2118)  cefTRIAXone (ROCEPHIN) injection 1 g (1 g Intramuscular Given 01/01/23 2247)  lidocaine (PF) (XYLOCAINE) 1 % injection 2 mL (2 mLs Intradermal Given 01/01/23 2247)                                                                                                                                     Procedures Procedures  (including critical care time)  Medical Decision Making / ED Course   MDM:  62 year old male presenting with penile pain.  On exam, Foley catheter in place, small amount of discharge around urethra.  Draining yellowish urine.  No bleeding from the urethra.  Given discharge and pain, will replace Foley catheter, obtain urinalysis from Foley.   Catheter appears to be draining without signs of obstruction.  Will reassess.    Clinical Course as of 01/01/23 2252  Tue Jan 01, 2023  2251 Patient reports his symptoms resolved after Foley catheter exchange.  Urinalysis of new Foley catheter does have bacteria, nitrates and leukocytes.  Concerning for infection.  Will treat with ceftriaxone IM and discharged with cefdinir prescription.  Advise close follow-up with urologist. Will discharge patient to home. All questions answered. Patient comfortable with plan of discharge. Return precautions discussed with patient and specified on the after visit summary.  [WS]    Clinical Course User Index [WS] Garnette Gunner  L, MD       Lab Tests: -I ordered, reviewed, and interpreted labs.   The pertinent results include:   Labs Reviewed  URINALYSIS, W/ REFLEX TO CULTURE (INFECTION SUSPECTED) - Abnormal; Notable for the following components:      Result Value   Color, Urine AMBER (*)    Specific Gravity, Urine 1.004 (*)    Hgb urine dipstick LARGE (*)    Protein, ur 30 (*)    Nitrite POSITIVE (*)    Leukocytes,Ua SMALL (*)    Bacteria, UA RARE (*)    All other components within normal limits  URINE CULTURE    Notable for signs of UTI   Medicines ordered and prescription drug management: Meds ordered this encounter  Medications   lidocaine (XYLOCAINE) 2 % jelly 1 Application   oxyCODONE-acetaminophen (PERCOCET/ROXICET) 5-325 MG per tablet 1 tablet   cefTRIAXone (ROCEPHIN) injection 1 g    Order Specific Question:   Antibiotic Indication:    Answer:   UTI   lidocaine (PF) (XYLOCAINE) 1 % injection 2 mL   cefdinir (OMNICEF) 300 MG capsule    Sig: Take 1 capsule (300 mg total) by mouth 2 (two) times daily for 10 days.    Dispense:  20 capsule    Refill:  0    -I have reviewed the patients home medicines and have made adjustments as needed  Social Determinants of Health:  Diagnosis or treatment significantly limited by social  determinants of health: current smoker   Reevaluation: After the interventions noted above, I reevaluated the patient and found that their symptoms have resolved  Co morbidities that complicate the patient evaluation  Past Medical History:  Diagnosis Date   Cancer    prostate   Chronic back pain    COPD (chronic obstructive pulmonary disease)    Depression    Hypertension    PTSD (post-traumatic stress disorder)    Sleep apnea       Dispostion: Disposition decision including need for hospitalization was considered, and patient discharged from emergency department.    Final Clinical Impression(s) / ED Diagnoses Final diagnoses:  Urinary tract infection associated with indwelling urethral catheter, initial encounter     This chart was dictated using voice recognition software.  Despite best efforts to proofread,  errors can occur which can change the documentation meaning.    Cristie Hem, MD 01/01/23 2252

## 2023-01-01 NOTE — ED Triage Notes (Signed)
Pt states he has a urinary catheter in place for urinary obstruction and today he is having penile pain at the catheter insertion site. Pt was Rx 500mg  of APAP for the pain and gabapentin with mild relief. Pt thinks the catheter has been pulled on.

## 2023-01-04 LAB — URINE CULTURE: Culture: 6000 — AB

## 2023-01-05 ENCOUNTER — Telehealth (HOSPITAL_BASED_OUTPATIENT_CLINIC_OR_DEPARTMENT_OTHER): Payer: Self-pay | Admitting: *Deleted

## 2023-01-05 NOTE — Progress Notes (Signed)
ED Antimicrobial Stewardship Positive Culture Follow Up   Jason Rojas is an 62 y.o. male who presented to [facility] on 01/01/2023 with a chief complaint of penile pain and slight discharge. The patient reports having a foley catheter for ~1 month. This was replaced during his most recent ED admission.   Chief Complaint  Patient presents with   Penis Pain    Recent Results (from the past 720 hour(s))  Urine Culture     Status: Abnormal   Collection Time: 01/01/23  9:49 PM   Specimen: Urine, Catheterized  Result Value Ref Range Status   Specimen Description   Final    URINE, CATHETERIZED Performed at [facility], [address], [place], [place] [address]    Special Requests   Final    NONE Reflexed from [id number] Performed at [facility], [address], [place], [place] [address]    Culture (A)  Final    6,000 COLONIES/mL ACINETOBACTER CALCOACETICUS/BAUMANNII COMPLEX   Report Status 01/04/2023 FINAL  Final   Organism ID, Bacteria ACINETOBACTER CALCOACETICUS/BAUMANNII COMPLEX (A)  Final      Susceptibility   Acinetobacter calcoaceticus/baumannii complex - MIC*    CEFTAZIDIME 4 SENSITIVE Sensitive     CIPROFLOXACIN <=0.25 SENSITIVE Sensitive     GENTAMICIN <=1 SENSITIVE Sensitive     IMIPENEM <=0.25 SENSITIVE Sensitive     PIP/TAZO <=4 SENSITIVE Sensitive     TRIMETH/SULFA <=20 SENSITIVE Sensitive     AMPICILLIN/SULBACTAM <=2 SENSITIVE Sensitive     * 6,000 COLONIES/mL ACINETOBACTER CALCOACETICUS/BAUMANNII COMPLEX    [x]  Treated with cefdinir, organism resistant to prescribed antimicrobial []  Patient discharged originally without antimicrobial agent and treatment is now indicated  New antibiotic prescription: Stop cefdinir. Start Bactrim DS 1 tablet by mouth BID x7 days  ED Provider: Delice Bison, PA-C    Cherylin Mylar, PharmD PGY1 Pharmacy Resident 4/6/202411:27 AM  Clinical Pharmacist Monday - Friday phone -  [phone] Saturday - Sunday phone -  [phone]

## 2023-01-05 NOTE — Telephone Encounter (Signed)
Post ED Visit - Positive Culture Follow-up: Successful Patient Follow-Up  Culture assessed and recommendations reviewed by:  []  Enzo Bi, Pharm.D. []  Celedonio Miyamoto, Pharm.D., BCPS AQ-ID []  Garvin Fila, Pharm.D., BCPS []  Georgina Pillion, Pharm.D., BCPS []  [place], [address].D., BCPS, AAHIVP []  Estella Husk, Pharm.D., BCPS, AAHIVP []  Lysle Pearl, PharmD, BCPS []  Phillips Climes, PharmD, BCPS []  Agapito Games, PharmD, BCPS []  Verlan Friends, PharmD  Positive urine culture  []  Patient discharged without antimicrobial prescription and treatment is now indicated [x]  Organism is resistant to prescribed ED discharge antimicrobial []  Patient with positive blood cultures  Changes discussed with ED provider: Jannifer Hick, PA New antibiotic prescription Bactrim 800/160mg  PO BID x 7 days Called to [facility] [address] [place] [phone]  Contacted patient, date 01/05/2023, time 1100   Lysle Pearl 01/05/2023, 11:04 AM

## 2023-01-18 ENCOUNTER — Other Ambulatory Visit: Payer: Self-pay | Admitting: Urology

## 2023-01-18 DIAGNOSIS — C61 Malignant neoplasm of prostate: Secondary | ICD-10-CM

## 2023-01-18 NOTE — Progress Notes (Addendum)
  Radiation Oncology         [phone]) [phone] ________________________________  Name: Jason Rojas MRN: [id number]  Date: 12/26/2022  DOB: 1961/05/13  End of Treatment Note  Diagnosis:   62 y.o. 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      Indication for treatment:  Curative, Definitive Radiotherapy concurrent with total androgen blockade (Eligard and Zytiga/prednisone)      Radiation treatment dates:    10/29/22 - 12/26/22  1. The prostate, seminal vesicles, and pelvic lymph nodes were initially treated to 45 Gy in 25 fractions of 1.8 Gy  2. The prostate only was boosted to 75 Gy with 15 additional fractions of 2.0 Gy  3.  The solitary rib metastasis was treated to 40 Gy in 5 fractions of 8 Gy each  Beams/energy:  1. The prostate, seminal vesicles, and pelvic lymph nodes were initially treated using VMAT intensity modulated radiotherapy delivering 6 megavolt photons. Image guidance was performed with CB-CT studies prior to each fraction. He was immobilized with a body fix lower extremity mold.  2. the prostate only was boosted using VMAT intensity modulated radiotherapy delivering 6 megavolt photons. Image guidance was performed with CB-CT studies prior to each fraction. He was immobilized with a body fix lower extremity mold. 3.  The solitary rib metastasis was treated using stereotactic body radiotherapy according to a 3D conformal radiotherapy plan.  Volumetric arc fields were employed to deliver 6 MV X-rays.  Image guidance was performed with per fraction cone beam CT prior to treatment under personal MD supervision.  Immobilization was achieved using BodyFix Pillow.  Narrative: The patient tolerated radiation treatment relatively well.   The patient experienced some minor urinary irritation and modest fatigue.  Towards the latter part of treatment, he was  noted to have a severely distended bladder despite not having any bothersome LUTS but did admit to having incontinence without warning.  He was evaluated in the urology clinic at the [facility] and Foley catheter was placed on 12/26/2022.  Plan: The patient will receive a call in about one month from the radiation oncology department. He will continue follow up with Dr. Sandie Ano at the [facility], as well. ________________________________  Artist Pais. Kathrynn Running, M.D.

## 2023-01-29 ENCOUNTER — Ambulatory Visit
Admission: RE | Admit: 2023-01-29 | Discharge: 2023-01-29 | Disposition: A | Discharge status: Home / Self Care | Payer: No Typology Code available for payment source | Source: Ambulatory Visit | Attending: Radiation Oncology | Admitting: Radiation Oncology

## 2023-01-29 DIAGNOSIS — C61 Malignant neoplasm of prostate: Secondary | ICD-10-CM | POA: Insufficient documentation

## 2023-01-29 NOTE — Progress Notes (Signed)
  Radiation Oncology         [phone]) [phone] ________________________________  Name: Jason Rojas MRN: [id number]  Date of Service: 01/29/2023  DOB: 06-Jul-1961  Post Treatment Telephone Note  Diagnosis:  62 y.o. 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       Indication for treatment:  Curative, Definitive Radiotherapy concurrent with total androgen blockade (Eligard and Zytiga/prednisone)  (as documented in provider EOT note)   The patient was not available for call today.  The patient will continue follow up with Dr. Sandie Ano at the [facility]. The patient was encouraged to call if he develops concerns or questions regarding radiation.   Ruel Favors, LPN

## 2023-02-26 ENCOUNTER — Encounter: Payer: Self-pay | Admitting: *Deleted

## 2023-02-26 ENCOUNTER — Inpatient Hospital Stay: Payer: No Typology Code available for payment source | Attending: Radiation Oncology | Admitting: *Deleted

## 2023-02-26 VITALS — BP 194/109 | HR 95 | Temp 97.9°F | Resp 18 | Ht 67.0 in | Wt 180.4 lb

## 2023-02-26 DIAGNOSIS — C61 Malignant neoplasm of prostate: Secondary | ICD-10-CM

## 2023-02-26 DIAGNOSIS — C7951 Secondary malignant neoplasm of bone: Secondary | ICD-10-CM

## 2023-02-26 NOTE — Progress Notes (Signed)
Called [facility] [place] to get next appts for pt concerning PSA labs and Eligard injection. This nurse will call pt with information.

## 2023-02-26 NOTE — Progress Notes (Signed)
SCP reviewed and completed. 

## 2023-05-15 ENCOUNTER — Encounter (HOSPITAL_COMMUNITY): Payer: Self-pay | Admitting: Emergency Medicine

## 2023-05-15 ENCOUNTER — Emergency Department (HOSPITAL_COMMUNITY)
Admission: EM | Admit: 2023-05-15 | Discharge: 2023-05-15 | Disposition: A | Discharge status: Home / Self Care | Payer: No Typology Code available for payment source | Attending: Emergency Medicine | Admitting: Emergency Medicine

## 2023-05-15 ENCOUNTER — Other Ambulatory Visit: Payer: Self-pay

## 2023-05-15 DIAGNOSIS — K649 Unspecified hemorrhoids: Secondary | ICD-10-CM | POA: Diagnosis not present

## 2023-05-15 DIAGNOSIS — K6289 Other specified diseases of anus and rectum: Secondary | ICD-10-CM | POA: Diagnosis present

## 2023-05-15 LAB — BASIC METABOLIC PANEL
Anion gap: 10 (ref 5–15)
BUN: 10 mg/dL (ref 8–23)
CO2: 26 mmol/L (ref 22–32)
Calcium: 9.3 mg/dL (ref 8.9–10.3)
Chloride: 94 mmol/L — ABNORMAL LOW (ref 98–111)
Creatinine, Ser: 0.6 mg/dL — ABNORMAL LOW (ref 0.61–1.24)
GFR, Estimated: >60 mL/min (ref >60)
Glucose, Bld: 132 mg/dL — ABNORMAL HIGH (ref 70–99)
Potassium: 3.4 mmol/L — ABNORMAL LOW (ref 3.5–5.1)
Sodium: 130 mmol/L — ABNORMAL LOW (ref 135–145)

## 2023-05-15 LAB — CBC WITH DIFFERENTIAL/PLATELET
Abs Immature Granulocytes: 0.04 10*3/uL (ref 0.00–0.07)
Basophils Absolute: 0 10*3/uL (ref 0.0–0.1)
Basophils Relative: 0 %
Eosinophils Absolute: 0.1 10*3/uL (ref 0.0–0.5)
Eosinophils Relative: 1 %
HCT: 39.8 % (ref 39.0–52.0)
Hemoglobin: 13.2 g/dL (ref 13.0–17.0)
Immature Granulocytes: 1 %
Lymphocytes Relative: 7 %
Lymphs Abs: 0.6 10*3/uL — ABNORMAL LOW (ref 0.7–4.0)
MCH: 28.4 pg (ref 26.0–34.0)
MCHC: 33.2 g/dL (ref 30.0–36.0)
MCV: 85.6 fL (ref 80.0–100.0)
Monocytes Absolute: 0.7 10*3/uL (ref 0.1–1.0)
Monocytes Relative: 9 %
Neutro Abs: 6.3 10*3/uL (ref 1.7–7.7)
Neutrophils Relative %: 82 %
Platelets: 209 10*3/uL (ref 150–400)
RBC: 4.65 MIL/uL (ref 4.22–5.81)
RDW: 14.4 % (ref 11.5–15.5)
WBC: 7.7 10*3/uL (ref 4.0–10.5)
nRBC: 0 % (ref 0.0–0.2)

## 2023-05-15 MED ORDER — HYDROMORPHONE HCL 1 MG/ML IJ SOLN
1.0000 mg | Freq: Once | INTRAMUSCULAR | Status: DC
  Filled 2023-05-15: qty 1

## 2023-05-15 MED ORDER — IBUPROFEN 400 MG PO TABS: 600.0000 mg | ORAL_TABLET | Freq: Once | ORAL | Status: DC

## 2023-05-15 MED ORDER — BARRIER CREAM NON-SPECIFIED: 1.0000 | TOPICAL_CREAM | Freq: Two times a day (BID) | TOPICAL | Status: DC | PRN

## 2023-05-15 MED ORDER — HYDROMORPHONE HCL 1 MG/ML IJ SOLN
INTRAMUSCULAR | Status: AC
  Administered 2023-05-15: 1 mg
  Filled 2023-05-15: qty 1

## 2023-05-15 NOTE — ED Triage Notes (Signed)
Pt hx of hemorrhoids and "they are killing me". Blood coming with bm's. This hx x 2 years but getting worse since did radiation for prostate ca. Pt appears uncomfortable in triage.

## 2023-05-15 NOTE — Discharge Instructions (Signed)
Pleasure taking care of you today.  You have hemorrhoids, you also have some skin breakdown (sores) likely related to moisture.  Keep this area dry.  You can use baby powder to help absorb moisture.  You to have these rechecked in a couple of days by your primary care doctor.  Come back to the ER if you get worse.  You can either follow-up with your surgeon at the [facility] or follow-up locally with our surgeon for your hemorrhoids for faster treatment.

## 2023-05-15 NOTE — ED Provider Notes (Signed)
EMERGENCY DEPARTMENT AT [facility] Provider Note   CSN: [id number] Arrival date & time: 05/15/23  1709     History  Chief Complaint  Patient presents with   Rectal Pain    Jason Rojas is a 62 y.o. male.  He has longstanding hemorrhoid pain is followed by the [place], he takes psyllium fiber and topical lidocaine.  Is scheduled for surgery for these in October but states today the pain was worse.  He has occasional blood with wiping.  Is unchanged.  He has no fevers, no drainage, no nausea or vomiting or abdominal pain  HPI     Home Medications Prior to Admission medications   Medication Sig Start Date End Date Taking? Authorizing Provider  amLODipine (NORVASC) 5 MG tablet Take 5 mg by mouth daily.     [provider]  carvedilol (COREG) 25 MG tablet Take 12.5 mg by mouth 2 (two) times daily with a meal.    [provider]  diphenhydrAMINE (BENADRYL) 25 MG tablet Take 1 tablet (25 mg total) by mouth every 6 (six) hours as needed. Patient not taking: Reported on 02/26/2023 04/17/20   Petrucelli, Lelon Mast R, PA-C  gabapentin (NEURONTIN) 800 MG tablet Take 800 mg by mouth 3 (three) times daily as needed (for pain/nerves).     [provider]  hydrocortisone (ANUSOL-HC) 2.5 % rectal cream Place 1 Application rectally 2 (two) times daily. 11/25/22   Linwood Dibbles, MD  hydrocortisone (ANUSOL-HC) 25 MG suppository Unwrap and place 1 suppository (25 mg total) rectally 2 (two) times daily. 11/08/22   Margaretmary Dys, MD  ibuprofen (ADVIL) 600 MG tablet Take 600 mg by mouth 3 (three) times daily.  Patient not taking: Reported on 02/26/2023    [provider]  lidocaine (XYLOCAINE) 5 % ointment Apply 1 Application topically as needed for moderate pain. 11/25/22   Linwood Dibbles, MD  polyethylene glycol (MIRALAX) 17 g packet Take 17 g by mouth daily. 11/25/22   Linwood Dibbles, MD  sildenafil (VIAGRA) 100 MG tablet Take 100 mg by mouth daily as needed for  erectile dysfunction. Patient not taking: Reported on 02/26/2023    [provider]  tamsulosin (FLOMAX) 0.4 MG CAPS capsule Take 1 capsule (0.4 mg total) by mouth daily after supper. 11/23/22   Margaretmary Dys, MD      Allergies    Tuberculin purified protein derivative    Review of Systems   Review of Systems  Physical Exam Updated Vital Signs BP (!) 139/90 (BP Location: Right Arm)   Pulse 81   Temp 97.7 F (36.5 C) (Oral)   Resp 16   SpO2 97%  Physical Exam Vitals and nursing note reviewed. Exam conducted with a chaperone present.  Constitutional:      General: He is not in acute distress.    Appearance: He is well-developed.  HENT:     Head: Normocephalic and atraumatic.     Mouth/Throat:     Mouth: Mucous membranes are moist.  Eyes:     Conjunctiva/sclera: Conjunctivae normal.  Cardiovascular:     Rate and Rhythm: Normal rate and regular rhythm.     Heart sounds: No murmur heard. Pulmonary:     Effort: Pulmonary effort is normal. No respiratory distress.     Breath sounds: Normal breath sounds.  Abdominal:     General: There is no distension.     Palpations: Abdomen is soft.     Tenderness: There is no abdominal tenderness.  Genitourinary:  Comments: Patient has hemorrhoids with mild tenderness.  Not thrombosed.  No signs of infection.  Bilateral medial buttock skin breakdown with some macerated skin noted.  No bleeding, no cellulitis, no induration.  No crepitus. Musculoskeletal:        General: No swelling.     Cervical back: Neck supple.  Skin:    General: Skin is warm and dry.     Capillary Refill: Capillary refill takes less than 2 seconds.  Neurological:     General: No focal deficit present.     Mental Status: He is alert and oriented to person, place, and time.  Psychiatric:        Mood and Affect: Mood normal.     ED Results / Procedures / Treatments   Labs (all labs ordered are listed, but only abnormal results are displayed) Labs  Reviewed  CBC WITH DIFFERENTIAL/PLATELET - Abnormal; Notable for the following components:      Result Value   Lymphs Abs 0.6 (*)    All other components within normal limits  BASIC METABOLIC PANEL - Abnormal; Notable for the following components:   Sodium 130 (*)    Potassium 3.4 (*)    Chloride 94 (*)    Glucose, Bld 132 (*)    Creatinine, Ser 0.60 (*)    All other components within normal limits    EKG None  Radiology No results found.  Procedures Procedures    Medications Ordered in ED Medications  HYDROmorphone (DILAUDID) injection 1 mg (1 mg Intramuscular Not Given 05/15/23 2204)  ibuprofen (ADVIL) tablet 600 mg (600 mg Oral Patient Refused/Not Given 05/15/23 2238)  barrier cream (non-specified) 1 Application (has no administration in time range)  HYDROmorphone (DILAUDID) 1 MG/ML injection (1 mg  Given 05/15/23 2210)    ED Course/ Medical Decision Making/ A&P                                 Medical Decision Making DDX: Hemorrhoids, ulceration, perianal abscess, knees gangrene, other  ED course: Patient has been being treated at the [facility] for hemorrhoids and is scheduled for surgery in October, states today they are more painful.  On exam he has macerated skin on his medial buttocks bilaterally with some skin breakdown.  No bleeding, no signs of cellulitis.  He has been using topical lidocaine ointment on this.  He does have hemorrhoids but do not appear to be thrombosed at this time and they are not actively bleeding.  There is no perianal abscess and no tenderness or crepitus.  He has reassuring vitals and BMP shows hyponatremia though this is same 5 months ago, no leukocytosis or anemia on his CBC just infection or bleeding.  Discussed keeping the skin clean and dry, discussed close follow-up for wound care, ordered barrier cream for discomfort skin protection, counseled him on using baby powder to keep the skin dry and given return precautions.  Amount and/or Complexity  of Data Reviewed Labs: ordered.  Risk Prescription drug management.           Final Clinical Impression(s) / ED Diagnoses Final diagnoses:  Hemorrhoids, unspecified hemorrhoid type    Rx / DC Orders ED Discharge Orders     None         Josem Kaufmann 05/15/23 2356    Bethann Berkshire, MD 05/17/23 [phone]

## 2023-06-20 ENCOUNTER — Encounter (HOSPITAL_COMMUNITY): Payer: Self-pay | Admitting: Emergency Medicine

## 2023-06-20 ENCOUNTER — Emergency Department (HOSPITAL_COMMUNITY)
Admission: EM | Admit: 2023-06-20 | Discharge: 2023-06-20 | Disposition: A | Discharge status: Home / Self Care | Payer: Medicaid Other | Attending: Emergency Medicine | Admitting: Emergency Medicine

## 2023-06-20 ENCOUNTER — Other Ambulatory Visit: Payer: Self-pay

## 2023-06-20 DIAGNOSIS — K644 Residual hemorrhoidal skin tags: Secondary | ICD-10-CM | POA: Diagnosis not present

## 2023-06-20 DIAGNOSIS — K6289 Other specified diseases of anus and rectum: Secondary | ICD-10-CM | POA: Diagnosis present

## 2023-06-20 DIAGNOSIS — K649 Unspecified hemorrhoids: Secondary | ICD-10-CM

## 2023-06-20 MED ORDER — HYDROCORTISONE (PERIANAL) 2.5 % EX CREA: 1.0000 | TOPICAL_CREAM | Freq: Two times a day (BID) | CUTANEOUS | 0 refills | Status: DC

## 2023-06-20 MED ORDER — OXYCODONE-ACETAMINOPHEN 5-325 MG PO TABS
ORAL_TABLET | ORAL | 0 refills | Status: DC
Start: 2023-06-20 — End: 2023-10-14

## 2023-06-20 NOTE — ED Provider Notes (Signed)
[place] EMERGENCY DEPARTMENT AT [facility] Provider Note   CSN: [id number] Arrival date & time: 06/20/23  1651     History  Chief Complaint  Patient presents with   Hemorrhoids    Jason Rojas is a 63 y.o. male.  Complains of hemorrhoids.  He is supposed to get surgery at the [place] but does not have a date for this yet.  He has been having more pain.  Patient has a history of hypertension  The history is provided by the patient and medical records. No language interpreter was used.  Abdominal Pain Pain location: Rectal pain. Pain quality: aching   Pain radiates to:  Does not radiate Pain severity:  Moderate Onset quality:  Sudden Timing:  Constant Progression:  Worsening Chronicity:  Recurrent Context: not medication withdrawal   Relieved by:  Nothing Associated symptoms: no chest pain, no cough, no diarrhea, no fatigue and no hematuria        Home Medications Prior to Admission medications   Medication Sig Start Date End Date Taking? Authorizing Provider  hydrocortisone (ANUSOL-HC) 2.5 % rectal cream Place 1 Application rectally 2 (two) times daily. 06/20/23  Yes Bethann Berkshire, MD  oxyCODONE-acetaminophen (PERCOCET/ROXICET) 5-325 MG tablet Take 1 pill every 6 hours as needed for pain not relieved by Tylenol or Motrin 06/20/23  Yes Bethann Berkshire, MD  amLODipine (NORVASC) 5 MG tablet Take 5 mg by mouth daily.     [provider]  carvedilol (COREG) 25 MG tablet Take 12.5 mg by mouth 2 (two) times daily with a meal.    [provider]  diphenhydrAMINE (BENADRYL) 25 MG tablet Take 1 tablet (25 mg total) by mouth every 6 (six) hours as needed. Patient not taking: Reported on 02/26/2023 04/17/20   Petrucelli, Lelon Mast R, PA-C  gabapentin (NEURONTIN) 800 MG tablet Take 800 mg by mouth 3 (three) times daily as needed (for pain/nerves).     [provider]  hydrocortisone (ANUSOL-HC) 25 MG suppository Unwrap and place 1 suppository (25 mg  total) rectally 2 (two) times daily. 11/08/22   Margaretmary Dys, MD  ibuprofen (ADVIL) 600 MG tablet Take 600 mg by mouth 3 (three) times daily.  Patient not taking: Reported on 02/26/2023    [provider]  lidocaine (XYLOCAINE) 5 % ointment Apply 1 Application topically as needed for moderate pain. 11/25/22   Linwood Dibbles, MD  polyethylene glycol (MIRALAX) 17 g packet Take 17 g by mouth daily. 11/25/22   Linwood Dibbles, MD  sildenafil (VIAGRA) 100 MG tablet Take 100 mg by mouth daily as needed for erectile dysfunction. Patient not taking: Reported on 02/26/2023    [provider]  tamsulosin (FLOMAX) 0.4 MG CAPS capsule Take 1 capsule (0.4 mg total) by mouth daily after supper. 11/23/22   Margaretmary Dys, MD      Allergies    Tuberculin purified protein derivative    Review of Systems   Review of Systems  Constitutional:  Negative for appetite change and fatigue.  HENT:  Negative for congestion, ear discharge and sinus pressure.   Eyes:  Negative for discharge.  Respiratory:  Negative for cough.   Cardiovascular:  Negative for chest pain.  Gastrointestinal:  Negative for diarrhea.       Rectal pain  Genitourinary:  Negative for frequency and hematuria.  Musculoskeletal:  Negative for back pain.  Skin:  Negative for rash.  Neurological:  Negative for seizures and headaches.  Psychiatric/Behavioral:  Negative for hallucinations.  Physical Exam Updated Vital Signs BP (!) 145/73 (BP Location: Right Arm)   Pulse 86   Temp 98.7 F (37.1 C) (Oral)   Resp 16   Ht 5\' 7"  (1.702 m)   Wt 83.9 kg   SpO2 99%   BMI 28.98 kg/m  Physical Exam Vitals and nursing note reviewed.  Constitutional:      Appearance: He is well-developed.  HENT:     Head: Normocephalic.     Nose: Nose normal.  Eyes:     General: No scleral icterus.    Conjunctiva/sclera: Conjunctivae normal.  Neck:     Thyroid: No thyromegaly.  Cardiovascular:     Rate and Rhythm: Normal rate and regular  rhythm.     Heart sounds: No murmur heard.    No friction rub. No gallop.  Pulmonary:     Breath sounds: No stridor. No wheezing or rales.  Chest:     Chest wall: No tenderness.  Abdominal:     General: There is no distension.     Tenderness: There is no abdominal tenderness. There is no rebound.  Genitourinary:    Comments: Tender external hemorrhoid Musculoskeletal:        General: Normal range of motion.     Cervical back: Neck supple.  Lymphadenopathy:     Cervical: No cervical adenopathy.  Skin:    Findings: No erythema or rash.  Neurological:     Mental Status: He is alert and oriented to person, place, and time.     Motor: No abnormal muscle tone.     Coordination: Coordination normal.  Psychiatric:        Behavior: Behavior normal.     ED Results / Procedures / Treatments   Labs (all labs ordered are listed, but only abnormal results are displayed) Labs Reviewed - No data to display  EKG None  Radiology No results found.  Procedures Procedures    Medications Ordered in ED Medications - No data to display  ED Course/ Medical Decision Making/ A&P                                 Medical Decision Making Risk Prescription drug management.  Patient with nonthrombosed external hemorrhoids.  He is placed on Anusol cream and given Percocet for pain and referred to surgery        Final Clinical Impression(s) / ED Diagnoses Final diagnoses:  Hemorrhoids, unspecified hemorrhoid type    Rx / DC Orders ED Discharge Orders          Ordered    hydrocortisone (ANUSOL-HC) 2.5 % rectal cream  2 times daily        06/20/23 2131    oxyCODONE-acetaminophen (PERCOCET/ROXICET) 5-325 MG tablet        06/20/23 2131              Bethann Berkshire, MD 06/22/23 1335

## 2023-06-20 NOTE — ED Triage Notes (Signed)
Pt presents with flare-up of hemorrhoids.

## 2023-06-20 NOTE — Discharge Instructions (Signed)
Follow-up with Dr. Henreitta Leber or one of her colleagues if you want to see a different surgeon other than the [facility]

## 2023-06-20 NOTE — ED Notes (Signed)
Patient reports chronic hemorrhoids. States pain has improved since being here; current pain 6/10. Denies any bleeding

## 2023-07-24 ENCOUNTER — Emergency Department (HOSPITAL_COMMUNITY)
Admission: EM | Admit: 2023-07-24 | Discharge: 2023-07-24 | Disposition: A | Discharge status: Home / Self Care | Payer: Non-veteran care | Attending: Emergency Medicine | Admitting: Emergency Medicine

## 2023-07-24 ENCOUNTER — Encounter (HOSPITAL_COMMUNITY): Payer: Self-pay | Admitting: Radiology

## 2023-07-24 ENCOUNTER — Other Ambulatory Visit: Payer: Self-pay

## 2023-07-24 DIAGNOSIS — I10 Essential (primary) hypertension: Secondary | ICD-10-CM | POA: Diagnosis not present

## 2023-07-24 DIAGNOSIS — Z8546 Personal history of malignant neoplasm of prostate: Secondary | ICD-10-CM | POA: Diagnosis not present

## 2023-07-24 DIAGNOSIS — K644 Residual hemorrhoidal skin tags: Secondary | ICD-10-CM | POA: Diagnosis present

## 2023-07-24 DIAGNOSIS — B3789 Other sites of candidiasis: Secondary | ICD-10-CM | POA: Insufficient documentation

## 2023-07-24 DIAGNOSIS — J449 Chronic obstructive pulmonary disease, unspecified: Secondary | ICD-10-CM | POA: Insufficient documentation

## 2023-07-24 DIAGNOSIS — Z79899 Other long term (current) drug therapy: Secondary | ICD-10-CM | POA: Insufficient documentation

## 2023-07-24 DIAGNOSIS — B372 Candidiasis of skin and nail: Secondary | ICD-10-CM

## 2023-07-24 MED ORDER — HYDROCORTISONE (PERIANAL) 2.5 % EX CREA: 1.0000 | TOPICAL_CREAM | Freq: Two times a day (BID) | CUTANEOUS | 0 refills | Status: DC

## 2023-07-24 MED ORDER — CLOTRIMAZOLE 1 % EX CREA: TOPICAL_CREAM | CUTANEOUS | 0 refills | Status: DC

## 2023-07-24 NOTE — Discharge Instructions (Signed)
You have a hemorrhoid outside your rectum.  This is caused from straining to have a bowel movement.  It is important that you drink plenty of water and eat lots of fruits and vegetables to help keep your stools soft.  I recommend that you take an over-the-counter stool softener as well.  Is important that you clean the area very gently with mild soap and water and dry the area before applying the cream, apply it as directed.  Hydrocortisone cream goes inside the rectum and the clotrimazole cream is to be applied to the crease between your buttocks.

## 2023-07-24 NOTE — ED Triage Notes (Signed)
Pt states his hemorrhoids started "acting up" today.Pt states that he is having small amount of bleeding with them. Pt endorse difficulty walking because the pain is so bad. Pt also endorses tooth pain.

## 2023-07-24 NOTE — ED Provider Notes (Signed)
[place] EMERGENCY DEPARTMENT AT [facility] Provider Note   CSN: [id number] Arrival date & time: 07/24/23  1930     History  Chief Complaint  Patient presents with   Hemorrhoids    Jason Rojas is a 62 y.o. male.  HPI     Jason Rojas is a 62 y.o. male with history of COPD, hypertension prostate cancer who presents to the Emergency Department complaining of rectal pain from hemorrhoid.  He states this is a recurring problem for him.  He admits to frequent constipation and straining to have a bowel movement.  He takes Percocet daily for chronic pain.  States that the inside of his buttocks is raw and he is having some bleeding from his hemorrhoid.  He denies any fever, chills, abdominal pain.    Home Medications Prior to Admission medications   Medication Sig Start Date End Date Taking? Authorizing Provider  clotrimazole (LOTRIMIN) 1 % cream Apply small amount to crease of your buttocks 2 times daily 07/24/23  Yes Harvard [employer], PA-C  hydrocortisone (ANUSOL-HC) 2.5 % rectal cream Place 1 Application rectally 2 (two) times daily. 07/24/23  Yes Maysin Carstens, PA-C  amLODipine (NORVASC) 5 MG tablet Take 5 mg by mouth daily.     [provider]  carvedilol (COREG) 25 MG tablet Take 12.5 mg by mouth 2 (two) times daily with a meal.    [provider]  diphenhydrAMINE (BENADRYL) 25 MG tablet Take 1 tablet (25 mg total) by mouth every 6 (six) hours as needed. Patient not taking: Reported on 02/26/2023 04/17/20   Petrucelli, Lelon Mast R, PA-C  gabapentin (NEURONTIN) 800 MG tablet Take 800 mg by mouth 3 (three) times daily as needed (for pain/nerves).     [provider]  ibuprofen (ADVIL) 600 MG tablet Take 600 mg by mouth 3 (three) times daily.  Patient not taking: Reported on 02/26/2023    [provider]  lidocaine (XYLOCAINE) 5 % ointment Apply 1 Application topically as needed for moderate pain. 11/25/22   Linwood Dibbles, MD   oxyCODONE-acetaminophen (PERCOCET/ROXICET) 5-325 MG tablet Take 1 pill every 6 hours as needed for pain not relieved by Tylenol or Motrin 06/20/23   Bethann Berkshire, MD  polyethylene glycol (MIRALAX) 17 g packet Take 17 g by mouth daily. 11/25/22   Linwood Dibbles, MD  sildenafil (VIAGRA) 100 MG tablet Take 100 mg by mouth daily as needed for erectile dysfunction. Patient not taking: Reported on 02/26/2023    [provider]  tamsulosin (FLOMAX) 0.4 MG CAPS capsule Take 1 capsule (0.4 mg total) by mouth daily after supper. 11/23/22   Margaretmary Dys, MD      Allergies    Tuberculin purified protein derivative    Review of Systems   Review of Systems  Constitutional:  Negative for appetite change, chills and fever.  Respiratory:  Negative for shortness of breath.   Cardiovascular:  Negative for chest pain.  Gastrointestinal:  Negative for abdominal pain and nausea.       Rectal pain secondary to hemorrhoids  Genitourinary:  Negative for difficulty urinating and dysuria.  Musculoskeletal:  Negative for back pain.  Skin:  Positive for rash.  Neurological:  Negative for dizziness and weakness.    Physical Exam Updated Vital Signs BP 102/77 (BP Location: Right Arm)   Pulse 81   Temp 98.7 F (37.1 C) (Oral)   Resp 17   Ht 5\' 7"  (1.702 m)   Wt 83.9 kg   SpO2 98%  BMI 28.98 kg/m  Physical Exam Vitals and nursing note reviewed. Exam conducted with a chaperone present.  Constitutional:      General: He is not in acute distress.    Appearance: Normal appearance. He is not ill-appearing or toxic-appearing.  Cardiovascular:     Rate and Rhythm: Normal rate and regular rhythm.     Pulses: Normal pulses.  Pulmonary:     Effort: Pulmonary effort is normal.  Abdominal:     General: There is no distension.     Palpations: Abdomen is soft.     Tenderness: There is no abdominal tenderness.  Genitourinary:    Rectum: External hemorrhoid present.     Comments: Patient has 1/2 cm  external nonthrombosed hemorrhoid present.  Skin of the gluteal folds appears macerated Skin:    General: Skin is warm.     Capillary Refill: Capillary refill takes less than 2 seconds.  Neurological:     General: No focal deficit present.     Mental Status: He is alert.     Sensory: No sensory deficit.     Motor: No weakness.     ED Results / Procedures / Treatments   Labs (all labs ordered are listed, but only abnormal results are displayed) Labs Reviewed - No data to display  EKG None  Radiology No results found.  Procedures Procedures    Medications Ordered in ED Medications - No data to display  ED Course/ Medical Decision Making/ A&P                                 Medical Decision Making Patient here with complaint of recurrent hemorrhoid pain.  Takes opiates daily suffers from chronic constipation.  Denies any abdominal pain fever, chills, nausea or vomiting.  Well-appearing on exam, vital signs reassuring.  On exam he has poor hygiene with feces and macerated skin between the gluteal folds.  1/2 cm nonthrombosed external hemorrhoid present.  He has reassuring abdominal exam.  Previous ER visits for same.  Amount and/or Complexity of Data Reviewed Discussion of management or test interpretation with external provider(s): Recurrent external hemorrhoids likely from opiate induced constipation.  States symptoms typically improve with Anusol cream.  Patient is well-appearing nontoxic.  Vital signs are reassuring.  Benign abdomen on exam. Patient appears appropriate for symptomatic treatment in addition to the external hemorrhoid, I suspect he also has Candida in the skin folds of his buttocks secondary to poor hygiene.  Risk Prescription drug management.           Final Clinical Impression(s) / ED Diagnoses Final diagnoses:  External hemorrhoids  Candidal intertrigo    Rx / DC Orders ED Discharge Orders          Ordered    hydrocortisone  (ANUSOL-HC) 2.5 % rectal cream  2 times daily        07/24/23 2217    clotrimazole (LOTRIMIN) 1 % cream        07/24/23 2217              Pauline Aus, PA-C 07/25/23 1159    Franne Forts, DO 08/01/23 [phone]

## 2023-08-20 ENCOUNTER — Emergency Department (HOSPITAL_COMMUNITY)
Admission: EM | Admit: 2023-08-20 | Discharge: 2023-08-20 | Disposition: A | Discharge status: Home / Self Care | Payer: No Typology Code available for payment source | Attending: Emergency Medicine | Admitting: Emergency Medicine

## 2023-08-20 DIAGNOSIS — K644 Residual hemorrhoidal skin tags: Secondary | ICD-10-CM | POA: Diagnosis not present

## 2023-08-20 DIAGNOSIS — F17210 Nicotine dependence, cigarettes, uncomplicated: Secondary | ICD-10-CM | POA: Insufficient documentation

## 2023-08-20 DIAGNOSIS — I10 Essential (primary) hypertension: Secondary | ICD-10-CM | POA: Insufficient documentation

## 2023-08-20 DIAGNOSIS — J449 Chronic obstructive pulmonary disease, unspecified: Secondary | ICD-10-CM | POA: Insufficient documentation

## 2023-08-20 DIAGNOSIS — Z8546 Personal history of malignant neoplasm of prostate: Secondary | ICD-10-CM | POA: Insufficient documentation

## 2023-08-20 DIAGNOSIS — Z79899 Other long term (current) drug therapy: Secondary | ICD-10-CM | POA: Insufficient documentation

## 2023-08-20 DIAGNOSIS — K649 Unspecified hemorrhoids: Secondary | ICD-10-CM | POA: Diagnosis present

## 2023-08-20 MED ORDER — LIDOCAINE (ANORECTAL) 5 % EX CREA: 1.0000 | TOPICAL_CREAM | Freq: Every day | CUTANEOUS | 0 refills | Status: DC | PRN

## 2023-08-20 MED ORDER — HYDROCORTISONE (PERIANAL) 2.5 % EX CREA: 1.0000 | TOPICAL_CREAM | Freq: Two times a day (BID) | CUTANEOUS | 0 refills | Status: DC

## 2023-08-20 NOTE — ED Provider Notes (Signed)
[facility] DE[facility]MENT AT [facility] Provider Note  CSN: [id number] Arrival date & time: 08/20/23 1519  Chief Complaint(s) Hemorrhoids  HPI Jason Rojas is a 62 y.o. male history of COPD presenting with hemorrhoid.  He reports that he has had hemorrhoids for 3 years.  He decided to come to the ER today because he wanted a referral to see a surgeon to have them "cut out".  Denies any change in his hemorrhoids recently.  Has been seen in the ER for this previously but never followed up with anyone.  No fevers or chills.   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 Authorizing Provider  abiraterone acetate (ZYTIGA) 250 MG tablet Take 1,000 mg by mouth daily. Take on an empty stomach 1 hour before or 2 hours after a meal    [provider]  acetaminophen (TYLENOL) 325 MG tablet Take 650 mg by mouth every 6 (six) hours as needed for mild pain (pain score 1-3) or moderate pain (pain score 4-6).    [provider]  acetaminophen (TYLENOL) 500 MG tablet Take 1,000 mg by mouth every 6 (six) hours as needed for moderate pain (pain score 4-6) or mild pain (pain score 1-3).    [provider]  amLODipine (NORVASC) 5 MG tablet Take 5 mg by mouth daily.     [provider]  carvedilol (COREG) 25 MG tablet Take 12.5 mg by mouth 2 (two) times daily with a meal.    [provider]  clotrimazole (LOTRIMIN) 1 % cream Apply small amount to crease of your buttocks 2 times daily 07/24/23   Triplett, Tammy, PA-C  diphenhydrAMINE (BENADRYL) 25 MG tablet Take 1 tablet (25 mg total) by mouth every 6 (six) hours as needed. Patient not taking: Reported on 02/26/2023 04/17/20   Petrucelli, Lelon Mast R, PA-C  gabapentin (NEURONTIN) 800 MG tablet Take 800 mg by mouth 3 (three) times daily as needed (for pain/nerves).     [provider]  hydrochlorothiazide (HYDRODIURIL) 25 MG tablet Take 25 mg by mouth daily.    [provider]  hydrocortisone (ANUSOL-HC) 2.5 % rectal cream Place 1 Application rectally 2 (two) times daily. 08/20/23   Lonell Grandchild, MD  ibuprofen (ADVIL) 600 MG tablet Take 600 mg by mouth 3 (three) times daily.  Patient not taking: Reported on 02/26/2023    [provider]  lidocaine (XYLOCAINE) 5 % ointment Apply 1 Application topically as needed for moderate pain. 11/25/22   Linwood Dibbles, MD  Lidocaine, Anorectal, (HEMORRHOIDAL RELIEF) 5 % CREA Apply 1 Application topically 5 (five) times daily as needed (HEMORRHOIDS). 08/20/23   Lonell Grandchild, MD  nicotine (NICODERM CQ - DOSED IN MG/24 HOURS) 21 mg/24hr patch Place 21 mg onto the skin daily.    [provider]  oxyCODONE-acetaminophen (PERCOCET/ROXICET) 5-325 MG tablet Take 1 pill  every 6 hours as needed for pain not relieved by Tylenol or Motrin 06/20/23   Bethann Berkshire, MD  polyethylene glycol (MIRALAX) 17 g packet Take 17 g by mouth daily. 11/25/22   Linwood Dibbles, MD  predniSONE (DELTASONE) 5 MG tablet Take 5 mg by mouth daily with breakfast.    [provider]  Psyllium 48.57 % POWD Take 1 each by mouth See admin instructions. Take 1 tablespoon by mouth daily to optimize bowel movements    [provider]  sennosides-docusate sodium (SENOKOT-S) 8.6-50 MG tablet Take 1 tablet by mouth at bedtime.    [provider]  sildenafil (VIAGRA)  100 MG tablet Take 100 mg by mouth daily as needed for erectile dysfunction. Patient not taking: Reported on 02/26/2023    [provider]  tamsulosin (FLOMAX) 0.4 MG CAPS capsule Take 1 capsule (0.4 mg total) by mouth daily after supper. 11/23/22   Margaretmary Dys, MD                                                                                                                                    Past Surgical History Past Surgical History:  Procedure Laterality Date   APPENDECTOMY     BIOPSY  03/23/2019   Procedure: BIOPSY;  Surgeon: Corbin Ade, MD;  Location: [facility];  Service: Endoscopy;;  gastric   COLONOSCOPY     2010 and 2015, adenomas   COLONOSCOPY WITH PROPOFOL N/A 03/23/2019   Procedure: COLONOSCOPY WITH PROPOFOL;  Surgeon: Corbin Ade, MD;  Location: [facility];  Service: Endoscopy;  Laterality: N/A;  1:30pm   ESOPHAGOGASTRODUODENOSCOPY (EGD) WITH PROPOFOL N/A 03/23/2019   Procedure: ESOPHAGOGASTRODUODENOSCOPY (EGD) WITH PROPOFOL;  Surgeon: Corbin Ade, MD;  Location: [facility];  Service: Endoscopy;  Laterality: N/A;   POLYPECTOMY  03/23/2019   Procedure: POLYPECTOMY;  Surgeon: Corbin Ade, MD;  Location: [facility];  Service: Endoscopy;;  colon   TONSILLECTOMY     Family History Family History  Problem Relation Age of Onset   Colon cancer Neg Hx     Social History Social History   Tobacco Use   Smoking status: Every Day    Current packs/day: 1.00    Types: Cigarettes   Smokeless tobacco: Never  Vaping Use   Vaping status: Never Used  Substance Use Topics   Alcohol use: Yes    Alcohol/week: 35.0 standard drinks of alcohol    Types: 35 Cans of beer per week    Comment: 5 beers daily   Drug use: Not Currently    Types: Cocaine    Comment: history, none in 3-4 years    Allergies Tuberculin purified protein derivative  Review of Systems Review of Systems  All other systems reviewed and are negative.   Physical  Exam Vital Signs  I have reviewed the triage vital signs BP (!) 165/99   Pulse 74  Temp 98 F (36.7 C) (Oral)   Resp 16   SpO2 98%  Physical Exam Vitals and nursing note reviewed.  Constitutional:      General: He is not in acute distress.    Appearance: Normal appearance.  HENT:     Head: Normocephalic and atraumatic.     Mouth/Throat:     Mouth: Mucous membranes are moist.  Eyes:     Conjunctiva/sclera: Conjunctivae normal.  Cardiovascular:     Rate and Rhythm: Normal rate.  Pulmonary:     Effort: Pulmonary effort is normal. No respiratory distress.  Abdominal:     General: Abdomen is flat.  Genitourinary:    Comments: Chaperoned by RN.  Single large external hemorrhoid present without thrombosis. Skin:    General: Skin is warm and dry.     Capillary Refill: Capillary refill takes less than 2 seconds.  Neurological:     General: No focal deficit present.     Mental Status: He is alert. Mental status is at baseline.  Psychiatric:        Mood and Affect: Mood normal.        Behavior: Behavior normal.     ED Results and Treatments Labs (all labs ordered are listed, but only abnormal results are displayed) Labs Reviewed - No data to display                                                                                                                        Radiology No results found.  Pertinent labs & imaging results that were available during my care of the patient were reviewed by me and considered in my medical decision making (see MDM for details).  Medications Ordered in ED Medications - No data to display                                                                                                                                   Procedures Procedures  (including critical care time)  Medical Decision Making / ED Course   MDM:  62 year old presenting with hemorrhoids.  On exam patient has a large hemorrhoid.  He reports that he has had this for  3 years.  Discussed self-care for hemorrhoids.  Will prescribe hemorrhoidal ointment.  Provided contact information for surgery should patient like to pursue hemorrhoid excision. Will discharge patient to home. All questions answered. Patient comfortable with plan of discharge. Return  precautions discussed with patient and specified on the after visit summary.       Additional history obtained:  -External records from outside source obtained and reviewed including: Chart review including previous notes, labs, imaging, consultation notes including prior er visit for same    Medicines ordered and prescription drug management: Meds ordered this encounter  Medications   hydrocortisone (ANUSOL-HC) 2.5 % rectal cream    Sig: Place 1 Application rectally 2 (two) times daily.    Dispense:  30 g    Refill:  0   Lidocaine, Anorectal, (HEMORRHOIDAL RELIEF) 5 % CREA    Sig: Apply 1 Application topically 5 (five) times daily as needed (HEMORRHOIDS).    Dispense:  38 g    Refill:  0    -I have reviewed the patients home medicines and have made adjustments as needed  Co morbidities that complicate the patient evaluation  Past Medical History:  Diagnosis Date   Cancer (HCC)    prostate   Chronic back pain    COPD (chronic obstructive pulmonary disease) (HCC)    Depression    Hypertension    PTSD (post-traumatic stress disorder)    Sleep apnea       Dispostion: Disposition decision including need for hospitalization was considered, and patient discharged from emergency department.    Final Clinical Impression(s) / ED Diagnoses Final diagnoses:  Hemorrhoids, unspecified hemorrhoid type     This chart was dictated using voice recognition software.  Despite best efforts to proofread,  errors can occur which can change the documentation meaning.    Lonell Grandchild, MD 08/20/23 2229

## 2023-08-20 NOTE — ED Triage Notes (Signed)
Pt to ED POV from home. Pt c/o hemorrhoids x3 years and states he wants them "cut out." Pt c/o rectal pain x3 years. Pt states he was seen recently at [facility] for same and was told to follow up out but states he has not been able to get around to seeing anyone out pt.

## 2023-08-20 NOTE — ED Notes (Signed)
 Pt eloped from the ed lobby

## 2023-09-11 ENCOUNTER — Encounter (HOSPITAL_COMMUNITY): Payer: Self-pay

## 2023-09-11 ENCOUNTER — Emergency Department (HOSPITAL_COMMUNITY)
Admission: EM | Admit: 2023-09-11 | Discharge: 2023-09-11 | Discharge status: Against Medical Advice | Payer: No Typology Code available for payment source | Attending: Emergency Medicine | Admitting: Emergency Medicine

## 2023-09-11 ENCOUNTER — Other Ambulatory Visit: Payer: Self-pay

## 2023-09-11 DIAGNOSIS — Z5321 Procedure and treatment not carried out due to patient leaving prior to being seen by health care provider: Secondary | ICD-10-CM | POA: Insufficient documentation

## 2023-09-11 DIAGNOSIS — K649 Unspecified hemorrhoids: Secondary | ICD-10-CM | POA: Diagnosis present

## 2023-09-11 DIAGNOSIS — K5900 Constipation, unspecified: Secondary | ICD-10-CM | POA: Diagnosis not present

## 2023-09-11 NOTE — ED Provider Triage Note (Signed)
Emergency Medicine Provider Triage Evaluation Note  Jason Rojas , a 62 y.o. male  was evaluated in triage.  Pt complains of hemorrhoid. Hx of hemorrhoid causing discomfort.  Report increasing rectal discomfort.  Report some constipation.  Denies rectal bleeding.  No fever, no trauma  Review of Systems  Positive: As above Negative: As above  Physical Exam  BP 90/62   Pulse 72   Temp 98.3 F (36.8 C)   Resp 17   Ht 5\' 7"  (1.702 m)   Wt 79.4 kg   SpO2 98%   BMI 27.41 kg/m  Gen:   Awake, no distress   Resp:  Normal effort  MSK:   Moves extremities without difficulty  Other:    Medical Decision Making  Medically screening exam initiated at 4:19 PM.  Appropriate orders placed.  JUMAL HUSSAIN was informed that the remainder of the evaluation will be completed by another provider, this initial triage assessment does not replace that evaluation, and the importance of remaining in the ED until their evaluation is complete.    Fayrene Helper, PA-C 09/11/23 1620

## 2023-09-11 NOTE — ED Notes (Signed)
Patient has been called x3. Patient was moved OTF.

## 2023-09-11 NOTE — ED Triage Notes (Signed)
Pt came to ED for hemorrhoids for 3 years. Last BM today. States 10/10 pain.

## 2023-10-13 NOTE — Progress Notes (Signed)
 GI Office Note    Referring Provider: [facility] Primary Care Physician:  [facility]  Primary Gastroenterologist: Ozell Hollingshead, MD    Chief Complaint   Chief Complaint  Patient presents with   Hemorrhoids   Constipation     History of Present Illness   Jason Rojas is a 63 y.o. male presenting today at the request of the [facility] for rectal pain and history of colon polyps, need colonoscopy. Last seen in 2020 for history of colon polyps. He was due for colonoscopy in 2023.   Recently with PCP, on DRE, reported to have ulcerated anal skin tags at 5,3, and 9 oclock positions, internal hemorrhoids at 9, 3, 12 oclock positions, laternal anal fissure present. No blood on DRE. Referral placed for general surgery evaluation for management of hemorrhoids and to [facility] for surveillance colonoscopy due to history of tubular adenomas.   Patient reports having rectal pain for 4-5 years. Usually daily pain. Not hurting right now but last night significant pain.  Does not typically have a lot of pain during bowel movements.  Typically pain occurs afterwards.  Has had issues with constipation previously but currently having regular soft bowel movements with Senokot.  Occasionally sees bright red blood per rectum.  No abdominal pain.  No heartburn.  No nausea or vomiting.  States he has not been able to tolerate hemorrhoid creams due to burning.  Used very limited amounts previously.  Patient is not sure when he is supposed to go see general surgery, has not heard regarding referral.  I do not see one pending in our system.  Requested he follow-up with PCP.  Labs 12.2024: ALT 18, AST 25, Tbili 0.4, AP 88,Hct 40.3, plt 220  EGD 03/2019: -mild erosive reflux esophagitis -abnormal gastric mucosa s/p bx no h.pylori -duodenal erosions  Colonoscopy 03/2019: -one 11 mm polyp in ascending colon  -one 4mm polyp in the cecum -non-bleeding internal hemorrhoids -tubular  adenoma -next colonoscopy in 3 years.   Medications   Current Outpatient Medications  Medication Sig Dispense Refill   abiraterone acetate (ZYTIGA) 250 MG tablet Take 1,000 mg by mouth daily. Take on an empty stomach 1 hour before or 2 hours after a meal     acetaminophen  (TYLENOL ) 325 MG tablet Take 650 mg by mouth every 6 (six) hours as needed for mild pain (pain score 1-3) or moderate pain (pain score 4-6).     acetaminophen  (TYLENOL ) 500 MG tablet Take 1,000 mg by mouth every 6 (six) hours as needed for moderate pain (pain score 4-6) or mild pain (pain score 1-3).     amLODipine  (NORVASC ) 5 MG tablet Take 5 mg by mouth daily.      carvedilol  (COREG ) 25 MG tablet Take 12.5 mg by mouth 2 (two) times daily with a meal.     clotrimazole  (LOTRIMIN ) 1 % cream Apply small amount to crease of your buttocks 2 times daily 15 g 0   gabapentin (NEURONTIN) 800 MG tablet Take 800 mg by mouth 3 (three) times daily as needed (for pain/nerves).      hydrochlorothiazide (HYDRODIURIL) 25 MG tablet Take 25 mg by mouth daily.     hydrocortisone  (ANUSOL -HC) 2.5 % rectal cream Place 1 Application rectally 2 (two) times daily. 30 g 0   lidocaine  (XYLOCAINE ) 5 % ointment Apply 1 Application topically as needed for moderate pain. 35.44 g 0   Lidocaine , Anorectal, (HEMORRHOIDAL RELIEF) 5 % CREA Apply 1 Application topically 5 (five)  times daily as needed (HEMORRHOIDS). 38 g 0   predniSONE  (DELTASONE ) 5 MG tablet Take 5 mg by mouth daily with breakfast.     sennosides-docusate sodium  (SENOKOT-S) 8.6-50 MG tablet Take 1 tablet by mouth at bedtime.     tamsulosin  (FLOMAX ) 0.4 MG CAPS capsule Take 1 capsule (0.4 mg total) by mouth daily after supper. 30 capsule 5   No current facility-administered medications for this visit.    Allergies   Allergies as of 10/14/2023 - Review Complete 10/14/2023  Allergen Reaction Noted   Tuberculin purified protein derivative  07/01/2000    Past Medical History   Past Medical  History:  Diagnosis Date   Cancer [facility])    prostate   Chronic back pain    COPD (chronic obstructive pulmonary disease) (HCC)    Depression    Hypertension    PTSD (post-traumatic stress disorder)    Sleep apnea     Past Surgical History   Past Surgical History:  Procedure Laterality Date   APPENDECTOMY     BIOPSY  03/23/2019   Procedure: BIOPSY;  Surgeon: Shaaron Lamar HERO, MD;  [facility];  Service: Endoscopy;;  gastric   COLONOSCOPY     2010 and 2015, adenomas   COLONOSCOPY WITH PROPOFOL  N/A 03/23/2019   Procedure: COLONOSCOPY WITH PROPOFOL ;  Surgeon: Shaaron Lamar HERO, MD;  [facility];  Service: Endoscopy;  Laterality: N/A;  1:30pm   ESOPHAGOGASTRODUODENOSCOPY (EGD) WITH PROPOFOL  N/A 03/23/2019   Procedure: ESOPHAGOGASTRODUODENOSCOPY (EGD) WITH PROPOFOL ;  Surgeon: Shaaron Lamar HERO, MD;  [facility];  Service: Endoscopy;  Laterality: N/A;   POLYPECTOMY  03/23/2019   Procedure: POLYPECTOMY;  Surgeon: Shaaron Lamar HERO, MD;  [facility];  Service: Endoscopy;;  colon   TONSILLECTOMY      Past Family History   Family History  Problem Relation Age of Onset   Colon cancer Neg Hx     Past Social History   Social History   Socioeconomic History   Marital status: Divorced    Spouse name: Not on file   Number of children: Not on file   Years of education: Not on file   Highest education level: Not on file  Occupational History   Not on file  Tobacco Use   Smoking status: Every Day    Current packs/day: 1.00    Types: Cigarettes   Smokeless tobacco: Never  Vaping Use   Vaping status: Never Used  Substance and Sexual Activity   Alcohol  use: Yes    Alcohol /week: 35.0 standard drinks of alcohol     Types: 35 Cans of beer per week    Comment: 5 beers daily   Drug use: Not Currently    Types: Cocaine    Comment: history, none in 3-4 years    Sexual activity: Never  Other Topics Concern   Not on file  Social History Narrative    Not on file   Social Drivers of Health   Financial Resource Strain: Not on file  Food Insecurity: No Food Insecurity (12/13/2022)   Hunger Vital Sign    Worried About Running Out of Food in the Last Year: Never true    Ran Out of Food in the Last Year: Never true  Transportation Needs: No Transportation Needs (12/13/2022)   PRAPARE - Administrator, Civil Service (Medical): No    Lack of Transportation (Non-Medical): No  Physical Activity: Not on file  Stress: Not on file  Social Connections:  Not on file  Intimate Partner Violence: Not At Risk (12/13/2022)   Humiliation, Afraid, Rape, and Kick questionnaire    Fear of Current or Ex-Partner: No    Emotionally Abused: No    Physically Abused: No    Sexually Abused: No    Review of Systems   General: Negative for anorexia, weight loss, fever, chills, fatigue, weakness. Eyes: Negative for vision changes.  ENT: Negative for hoarseness, difficulty swallowing , nasal congestion. CV: Negative for chest pain, angina, palpitations, dyspnea on exertion, peripheral edema.  Respiratory: Negative for dyspnea at rest, dyspnea on exertion, cough, sputum, wheezing.  GI: See history of present illness. GU:  Negative for dysuria, hematuria, urinary incontinence, urinary frequency, nocturnal urination.  MS: Negative for joint pain.  Positive low back pain.  Derm: Negative for rash or itching.  Neuro: Negative for weakness, abnormal sensation, seizure, frequent headaches, memory loss,  confusion.  Psych: Negative for anxiety, depression, suicidal ideation, hallucinations.  Endo: Negative for unusual weight change.  Heme: Negative for bruising or bleeding. Allergy: Negative for rash or hives.  Physical Exam   BP 138/74 (BP Location: Right Arm, Patient Position: Sitting, Cuff Size: Normal)   Pulse 94   Temp 98.6 F (37 C) (Oral)   Ht 5' 7 (1.702 m)   Wt 202 lb (91.6 kg)   SpO2 97%   BMI 31.64 kg/m    General:  Well-nourished, well-developed in no acute distress.  Head: Normocephalic, atraumatic.   Eyes: Conjunctiva pink, no icterus. Mouth: Oropharyngeal mucosa moist and pink  Neck: Supple without thyromegaly, masses, or lymphadenopathy.  Lungs: Clear to auscultation bilaterally.  Heart: Regular rate and rhythm, no murmurs rubs or gallops.  Abdomen: Bowel sounds are normal, nontender, nondistended, no hepatosplenomegaly or masses,  no abdominal bruits or hernia, no rebound or guarding.   Rectal: Deferred Extremities: No lower extremity edema. No clubbing or deformities.  Neuro: Alert and oriented x 4 , grossly normal neurologically.  Skin: Warm and dry, no rash or jaundice.   Psych: Alert and cooperative, normal mood and affect.  Labs   See HPI Imaging Studies   No results found.  Assessment/Plan   H/O adenomatous colon polyps/rectal pain: -update colonoscopy now. ASA 3. Room1/2.  I have discussed the risks, alternatives, benefits with regards to but not limited to the risk of reaction to medication, bleeding, infection, perforation and the patient is agreeable to proceed. Written consent to be obtained.  Rectal pain: known hemorrhoid disease, possible fissure as outlined based on recent DRE. Patient has h/o prostate cancer, received radiation therapy therefore cannot exclude possibility of radiation prostatitis. -Colonoscopy is planned -He will follow-up with PCP regarding general surgery consult for definitive treatment of his hemorrhoids, patient reports rectal pain for years. -Encouraged him to utilize hydrocortisone  and lidocaine  cream as prescribed  Sonny RAMAN. Ezzard, MHS, PA-C [facility]

## 2023-10-14 ENCOUNTER — Telehealth: Payer: Self-pay | Admitting: *Deleted

## 2023-10-14 ENCOUNTER — Ambulatory Visit (INDEPENDENT_AMBULATORY_CARE_PROVIDER_SITE_OTHER): Payer: Non-veteran care | Admitting: Gastroenterology

## 2023-10-14 ENCOUNTER — Encounter: Payer: Self-pay | Admitting: Gastroenterology

## 2023-10-14 VITALS — BP 138/74 | HR 94 | Temp 98.6°F | Ht 67.0 in | Wt 202.0 lb

## 2023-10-14 DIAGNOSIS — F1090 Alcohol use, unspecified, uncomplicated: Secondary | ICD-10-CM

## 2023-10-14 DIAGNOSIS — K6289 Other specified diseases of anus and rectum: Secondary | ICD-10-CM | POA: Diagnosis not present

## 2023-10-14 DIAGNOSIS — K649 Unspecified hemorrhoids: Secondary | ICD-10-CM

## 2023-10-14 DIAGNOSIS — F17210 Nicotine dependence, cigarettes, uncomplicated: Secondary | ICD-10-CM | POA: Diagnosis not present

## 2023-10-14 DIAGNOSIS — Z8546 Personal history of malignant neoplasm of prostate: Secondary | ICD-10-CM

## 2023-10-14 DIAGNOSIS — Z860100 Personal history of colon polyps, unspecified: Secondary | ICD-10-CM

## 2023-10-14 DIAGNOSIS — Z860101 Personal history of adenomatous and serrated colon polyps: Secondary | ICD-10-CM

## 2023-10-14 NOTE — Patient Instructions (Signed)
 Colonoscopy to be scheduled.  Please reach out to your primary care provider regarding referral to general surgery for management of your hemorrhoids.

## 2023-10-14 NOTE — Telephone Encounter (Signed)
 Called pt, no answer and VM not set up. Needs TCS with Dr. Jena Gauss, ASA 3 (ok rm 1/2), bmet prior, bisacodyl 10mg  every day 3 days prior to prep

## 2023-10-17 NOTE — Telephone Encounter (Signed)
Tried calling patient again. No answer and not able to leave VM. Letter mailed.

## 2023-11-04 ENCOUNTER — Encounter: Payer: Self-pay | Admitting: *Deleted

## 2023-11-04 ENCOUNTER — Other Ambulatory Visit: Payer: Self-pay | Admitting: *Deleted

## 2023-11-04 DIAGNOSIS — Z860100 Personal history of colon polyps, unspecified: Secondary | ICD-10-CM

## 2023-11-04 MED ORDER — PEG 3350-KCL-NA BICARB-NACL 420 G PO SOLR: 4000.0000 mL | Freq: Once | ORAL | 0 refills | Status: AC

## 2023-11-04 NOTE — Addendum Note (Signed)
Addended by: Elinor Dodge on: 11/04/2023 03:11 PM   Modules accepted: Orders

## 2023-11-19 ENCOUNTER — Telehealth: Payer: Self-pay | Admitting: *Deleted

## 2023-11-19 NOTE — Telephone Encounter (Signed)
Pt called in. He needs to reschedule procedure on for Thursday due to his transportation and weather.  He has been moved to 3/24, 10:30am. Aware will send him new instructions in the mail.

## 2023-11-27 ENCOUNTER — Encounter (HOSPITAL_COMMUNITY): Payer: Self-pay

## 2023-11-27 ENCOUNTER — Emergency Department (HOSPITAL_COMMUNITY)
Admission: EM | Admit: 2023-11-27 | Discharge: 2023-11-27 | Disposition: A | Discharge status: Home / Self Care | Payer: No Typology Code available for payment source | Attending: Emergency Medicine | Admitting: Emergency Medicine

## 2023-11-27 ENCOUNTER — Emergency Department (HOSPITAL_COMMUNITY): Payer: No Typology Code available for payment source

## 2023-11-27 ENCOUNTER — Other Ambulatory Visit: Payer: Self-pay

## 2023-11-27 DIAGNOSIS — L03317 Cellulitis of buttock: Secondary | ICD-10-CM

## 2023-11-27 DIAGNOSIS — K649 Unspecified hemorrhoids: Secondary | ICD-10-CM | POA: Insufficient documentation

## 2023-11-27 LAB — BASIC METABOLIC PANEL
Anion gap: 12 (ref 5–15)
BUN: 10 mg/dL (ref 8–23)
CO2: 27 mmol/L (ref 22–32)
Calcium: 9 mg/dL (ref 8.9–10.3)
Chloride: 90 mmol/L — ABNORMAL LOW (ref 98–111)
Creatinine, Ser: 0.58 mg/dL — ABNORMAL LOW (ref 0.61–1.24)
GFR, Estimated: >60 mL/min (ref >60)
Glucose, Bld: 104 mg/dL — ABNORMAL HIGH (ref 70–99)
Potassium: 3.4 mmol/L — ABNORMAL LOW (ref 3.5–5.1)
Sodium: 129 mmol/L — ABNORMAL LOW (ref 135–145)

## 2023-11-27 LAB — CBC WITH DIFFERENTIAL/PLATELET
Abs Immature Granulocytes: 0.06 10*3/uL (ref 0.00–0.07)
Basophils Absolute: 0 10*3/uL (ref 0.0–0.1)
Basophils Relative: 1 %
Eosinophils Absolute: 0.2 10*3/uL (ref 0.0–0.5)
Eosinophils Relative: 3 %
HCT: 36.5 % — ABNORMAL LOW (ref 39.0–52.0)
Hemoglobin: 12.5 g/dL — ABNORMAL LOW (ref 13.0–17.0)
Immature Granulocytes: 1 %
Lymphocytes Relative: 15 %
Lymphs Abs: 1.2 10*3/uL (ref 0.7–4.0)
MCH: 28.3 pg (ref 26.0–34.0)
MCHC: 34.2 g/dL (ref 30.0–36.0)
MCV: 82.6 fL (ref 80.0–100.0)
Monocytes Absolute: 1 10*3/uL (ref 0.1–1.0)
Monocytes Relative: 13 %
Neutro Abs: 5.2 10*3/uL (ref 1.7–7.7)
Neutrophils Relative %: 67 %
Platelets: 218 10*3/uL (ref 150–400)
RBC: 4.42 MIL/uL (ref 4.22–5.81)
RDW: 13.7 % (ref 11.5–15.5)
WBC: 7.7 10*3/uL (ref 4.0–10.5)
nRBC: 0 % (ref 0.0–0.2)

## 2023-11-27 MED ORDER — MUPIROCIN 2 % EX OINT: TOPICAL_OINTMENT | Freq: Three times a day (TID) | CUTANEOUS | 0 refills | Status: DC

## 2023-11-27 MED ORDER — IOHEXOL 300 MG/ML  SOLN
100.0000 mL | Freq: Once | INTRAMUSCULAR | Status: AC | PRN
  Administered 2023-11-27: 100 mL via INTRAVENOUS

## 2023-11-27 MED ORDER — PRAMOXINE-HC 1-2.5 % EX CREA: TOPICAL_CREAM | Freq: Three times a day (TID) | CUTANEOUS | 0 refills | Status: DC

## 2023-11-27 MED ORDER — DOXYCYCLINE HYCLATE 100 MG PO CAPS: 100.0000 mg | ORAL_CAPSULE | Freq: Two times a day (BID) | ORAL | 0 refills | Status: DC

## 2023-11-27 MED ORDER — CEPHALEXIN 500 MG PO CAPS
500.0000 mg | ORAL_CAPSULE | Freq: Once | ORAL | Status: AC
  Administered 2023-11-27: 500 mg via ORAL
  Filled 2023-11-27: qty 1

## 2023-11-27 MED ORDER — CEPHALEXIN 500 MG PO CAPS: 500.0000 mg | ORAL_CAPSULE | Freq: Four times a day (QID) | ORAL | 0 refills | Status: AC

## 2023-11-27 MED ORDER — DOXYCYCLINE HYCLATE 100 MG PO TABS
100.0000 mg | ORAL_TABLET | Freq: Once | ORAL | Status: AC
  Administered 2023-11-27: 100 mg via ORAL
  Filled 2023-11-27: qty 1

## 2023-11-27 NOTE — ED Triage Notes (Signed)
 Pt arrived via POV c/o hemorrhoids for the past few years. Pt endorses severe pain and being unable to get an appointment at the [facility] hospital to have this addressed.

## 2023-11-27 NOTE — Discharge Instructions (Addendum)
 These take all antibiotics as directed.  Follow-up closely with your primary care doctor and general surgery on an outpatient basis.  Please continue good hygiene and keep your rectal region clean and dry.  Apply the creams daily as directed.

## 2023-11-27 NOTE — ED Provider Notes (Signed)
 [place] EMERGENCY DEPARTMENT AT [facility] Provider Note   CSN: [id number] Arrival date & time: 11/27/23  1615     History  Chief Complaint  Patient presents with   Hemorrhoids    Jason Rojas is a 63 y.o. male.  Patient is a 64 year old male who presents emergency department the chief complaint of hemorrhoids.  Patient notes that he has been experiencing pain around his rectum for approximate the past 2 years.  He notes that he has been seeing his primary care doctor for this and notes that he is due for colonoscopy in 1 month.  He notes that the pain has gradually become worse causing him to present to the emergency department tonight.  Patient denies any associated melena or hematochezia.  He denies any associated abdominal pain, nausea, vomiting or diarrhea.        Home Medications Prior to Admission medications   Medication Sig Start Date End Date Taking? Authorizing Provider  abiraterone acetate (ZYTIGA) 250 MG tablet Take 1,000 mg by mouth daily. Take on an empty stomach 1 hour before or 2 hours after a meal    [provider]  acetaminophen (TYLENOL) 325 MG tablet Take 650 mg by mouth every 6 (six) hours as needed for mild pain (pain score 1-3) or moderate pain (pain score 4-6).    [provider]  acetaminophen (TYLENOL) 500 MG tablet Take 1,000 mg by mouth every 6 (six) hours as needed for moderate pain (pain score 4-6) or mild pain (pain score 1-3).    [provider]  amLODipine (NORVASC) 5 MG tablet Take 5 mg by mouth daily.     [provider]  carvedilol (COREG) 25 MG tablet Take 12.5 mg by mouth 2 (two) times daily with a meal.    [provider]  clotrimazole (LOTRIMIN) 1 % cream Apply small amount to crease of your buttocks 2 times daily 07/24/23   Triplett, Tammy, PA-C  gabapentin (NEURONTIN) 800 MG tablet Take 800 mg by mouth 3 (three) times daily as needed (for pain/nerves).     [provider]   hydrochlorothiazide (HYDRODIURIL) 25 MG tablet Take 25 mg by mouth daily.    [provider]  hydrocortisone (ANUSOL-HC) 2.5 % rectal cream Place 1 Application rectally 2 (two) times daily. 08/20/23   Lonell Grandchild, MD  lidocaine (XYLOCAINE) 5 % ointment Apply 1 Application topically as needed for moderate pain. 11/25/22   Linwood Dibbles, MD  Lidocaine, Anorectal, (HEMORRHOIDAL RELIEF) 5 % CREA Apply 1 Application topically 5 (five) times daily as needed (HEMORRHOIDS). 08/20/23   Lonell Grandchild, MD  predniSONE (DELTASONE) 5 MG tablet Take 5 mg by mouth daily with breakfast.    [provider]  sennosides-docusate sodium (SENOKOT-S) 8.6-50 MG tablet Take 1 tablet by mouth at bedtime.    [provider]  tamsulosin (FLOMAX) 0.4 MG CAPS capsule Take 1 capsule (0.4 mg total) by mouth daily after supper. 11/23/22   Margaretmary Dys, MD      Allergies    Tuberculin purified protein derivative    Review of Systems   Review of Systems  Gastrointestinal:  Positive for rectal pain.  All other systems reviewed and are negative.   Physical Exam Updated Vital Signs BP (!) 141/78 (BP Location: Right Arm)   Pulse 86   Temp 98.8 F (37.1 C) (Oral)   Resp 16   Ht 5\' 7"  (1.702 m)   Wt 92 kg   SpO2 98%  BMI 31.77 kg/m  Physical Exam Vitals reviewed. Exam conducted with a chaperone present.  Constitutional:      Appearance: Normal appearance.  HENT:     Head: Normocephalic and atraumatic.     Nose: Nose normal.     Mouth/Throat:     Mouth: Mucous membranes are moist.  Eyes:     Extraocular Movements: Extraocular movements intact.     Conjunctiva/sclera: Conjunctivae normal.     Pupils: Pupils are equal, round, and reactive to light.  Cardiovascular:     Rate and Rhythm: Normal rate and regular rhythm.     Pulses: Normal pulses.     Heart sounds: Normal heart sounds.  Pulmonary:     Effort: Pulmonary effort is normal.     Breath sounds: Normal breath  sounds.  Abdominal:     General: Abdomen is flat. Bowel sounds are normal. There is no distension.     Palpations: Abdomen is soft.     Tenderness: There is no abdominal tenderness. There is no guarding.  Genitourinary:    Comments: Rectal exam performed with nurse chaperone, no obvious external hemorrhoids noted or thrombosed hemorrhoids, macerated skin noted with overlying erythema of the gluteus, no areas of induration or fluctuance Musculoskeletal:        General: Normal range of motion.     Cervical back: Normal range of motion and neck supple.  Skin:    General: Skin is warm and dry.  Neurological:     General: No focal deficit present.     Mental Status: He is alert and oriented to person, place, and time. Mental status is at baseline.  Psychiatric:        Mood and Affect: Mood normal.        Behavior: Behavior normal.        Thought Content: Thought content normal.        Judgment: Judgment normal.     ED Results / Procedures / Treatments   Labs (all labs ordered are listed, but only abnormal results are displayed) Labs Reviewed  BASIC METABOLIC PANEL - Abnormal; Notable for the following components:      Result Value   Sodium 129 (*)    Potassium 3.4 (*)    Chloride 90 (*)    Glucose, Bld 104 (*)    Creatinine, Ser 0.58 (*)    All other components within normal limits  CBC WITH DIFFERENTIAL/PLATELET - Abnormal; Notable for the following components:   Hemoglobin 12.5 (*)    HCT 36.5 (*)    All other components within normal limits    EKG None  Radiology CT ABDOMEN PELVIS W CONTRAST Result Date: 11/27/2023 CLINICAL DATA:  Perirectal pain EXAM: CT ABDOMEN AND PELVIS WITH CONTRAST TECHNIQUE: Multidetector CT imaging of the abdomen and pelvis was performed using the standard protocol following bolus administration of intravenous contrast. RADIATION DOSE REDUCTION: This exam was performed according to the departmental dose-optimization program which includes automated  exposure control, adjustment of the mA and/or kV according to patient size and/or use of iterative reconstruction technique. CONTRAST:  OMNIPAQUE IOHEXOL 300 MG/ML  SOLN COMPARISON:  None Available. FINDINGS: Lower chest: No acute abnormality. Hepatobiliary: No focal liver abnormality is seen. No gallstones, gallbladder wall thickening, or biliary dilatation. Pancreas: Unremarkable. No pancreatic ductal dilatation or surrounding inflammatory changes. Spleen: Normal in size without focal abnormality. Adrenals/Urinary Tract: There is a 3.7 cm cyst in the inferior pole the right kidney. Otherwise, the the kidneys and adrenal glands are within  normal limits. There is mild bladder wall thickening. Stomach/Bowel: Stomach is within normal limits. Appendix is not seen. No evidence of bowel wall thickening, distention, or inflammatory changes. Vascular/Lymphatic: Aortic atherosclerosis. No enlarged abdominal or pelvic lymph nodes. Reproductive: Prostate is unremarkable. Other: There is a small fat containing right inguinal hernia. There is no ascites. Musculoskeletal: Degenerative changes affect the spine. IMPRESSION: 1. Mild bladder wall thickening. Correlate clinically for cystitis. 2. Right renal cyst. No follow-up imaging recommended. Aortic Atherosclerosis (ICD10-I70.0). Electronically Signed   By: Darliss Cheney M.D.   On: 11/27/2023 21:43    Procedures Procedures    Medications Ordered in ED Medications  cephALEXin (KEFLEX) capsule 500 mg (has no administration in time range)  doxycycline (VIBRA-TABS) tablet 100 mg (has no administration in time range)  iohexol (OMNIPAQUE) 300 MG/ML solution 100 mL (100 mLs Intravenous Contrast Given 11/27/23 2125)    ED Course/ Medical Decision Making/ A&P                                 Medical Decision Making Amount and/or Complexity of Data Reviewed Labs: ordered. Radiology: ordered.  Risk Prescription drug management.   This patient presents to the  ED for concern of active pain differential diagnosis includes hemorrhoids, perirectal abscess, cellulitis, rectal mass  Additional history obtained:  Additional history obtained from medical records External records from outside source obtained and reviewed including none   Lab Tests:  I Ordered, and personally interpreted labs.  The pertinent results include: No leukocytosis   Imaging Studies ordered:  I ordered imaging studies including CT scan of the abdomen and pelvis I independently visualized and interpreted imaging which showed no acute drainable abscess appreciated I agree with the radiologist interpretation   Medicines ordered and prescription drug management:  I ordered medication including Keflex, doxycycline, mupirocin, Proctofoam for cellulitis and hemorrhoids Reevaluation of the patient after these medicines showed that the patient improved I have reviewed the patients home medicines and have made adjustments as needed   Problem List / ED Course:  Patient is doing well at this time and is stable for discharge home.  Discussed with patient CT scan of abdomen pelvis demonstrated no indication for abscess formation.  Will cover patient for cellulitis at this time.  The need for continued good hygiene of the rectal region was discussed.  Will treat him for hemorrhoids as well.  He already has follow-up with gastroenterology in 1 month for a colonoscopy.  He has no indication for Fournier's gangrene or spreading infection to the perineum.  CT scan did demonstrate some inflammation within the bladder though patient denies any urinary complaints and notes that he does have a known history of bladder cancer.  Information for follow-up with general surgery was provided.  Strict turn precautions were discussed for any new or worsening symptoms.  Patient voiced understanding had no additional questions.   Social Determinants of Health:  None           Final Clinical  Impression(s) / ED Diagnoses Final diagnoses:  None    Rx / DC Orders ED Discharge Orders     None         Kathlen Mody 11/27/23 2215    Terrilee Files, MD 11/28/23 1009

## 2023-12-08 ENCOUNTER — Other Ambulatory Visit: Payer: Self-pay

## 2023-12-08 ENCOUNTER — Emergency Department (HOSPITAL_COMMUNITY)
Admission: EM | Admit: 2023-12-08 | Discharge: 2023-12-08 | Disposition: A | Discharge status: Home / Self Care | Attending: Emergency Medicine | Admitting: Emergency Medicine

## 2023-12-08 ENCOUNTER — Encounter (HOSPITAL_COMMUNITY): Payer: Self-pay

## 2023-12-08 DIAGNOSIS — Z8546 Personal history of malignant neoplasm of prostate: Secondary | ICD-10-CM | POA: Insufficient documentation

## 2023-12-08 DIAGNOSIS — K6289 Other specified diseases of anus and rectum: Secondary | ICD-10-CM | POA: Diagnosis present

## 2023-12-08 MED ORDER — HYDROCORTISONE (PERIANAL) 2.5 % EX CREA: 1.0000 | TOPICAL_CREAM | Freq: Two times a day (BID) | CUTANEOUS | 0 refills | Status: DC

## 2023-12-08 MED ORDER — ACETAMINOPHEN 500 MG PO TABS
1000.0000 mg | ORAL_TABLET | Freq: Once | ORAL | Status: AC
  Administered 2023-12-08: 1000 mg via ORAL
  Filled 2023-12-08: qty 2

## 2023-12-08 NOTE — ED Provider Notes (Signed)
 [place] EMERGENCY DEPARTMENT AT [facility] Provider Note   CSN: [id number] Arrival date & time: 12/08/23  1606     History  Chief Complaint  Patient presents with   Hemorrhoids    Jason Rojas is a 63 y.o. male.  The history is provided by the patient and medical records. No language interpreter was used.     63 year old male history of prostate cancer, recurrent rectal pain, presenting complaining of hemorrhoid pain.  Patient report ongoing rectal pain for at least 2 years.  Increasing pain when he tried to wipe with bowel movement.  He attributed to his hemorrhoid.  Increasing pain today prompting this ER visit.  States he has been seen evaluate multiple times for this and is not going away.  Does not endorse any abdominal pain nausea and denies constipation.  He has not noticed any bleeding.  Denies any trauma.  Has had a history of prostate cancer status post radiation treatment.  He does have a colonoscopy scheduled this month.  Home Medications Prior to Admission medications   Medication Sig Start Date End Date Taking? Authorizing Provider  abiraterone acetate (ZYTIGA) 250 MG tablet Take 1,000 mg by mouth daily. Take on an empty stomach 1 hour before or 2 hours after a meal    [provider]  acetaminophen (TYLENOL) 325 MG tablet Take 650 mg by mouth every 6 (six) hours as needed for mild pain (pain score 1-3) or moderate pain (pain score 4-6).    [provider]  acetaminophen (TYLENOL) 500 MG tablet Take 1,000 mg by mouth every 6 (six) hours as needed for moderate pain (pain score 4-6) or mild pain (pain score 1-3).    [provider]  amLODipine (NORVASC) 5 MG tablet Take 5 mg by mouth daily.     [provider]  carvedilol (COREG) 25 MG tablet Take 12.5 mg by mouth 2 (two) times daily with a meal.    [provider]  clotrimazole (LOTRIMIN) 1 % cream Apply small amount to crease of your buttocks 2 times daily 07/24/23    Triplett, Tammy, PA-C  doxycycline (VIBRAMYCIN) 100 MG capsule Take 1 capsule (100 mg total) by mouth 2 (two) times daily. 11/27/23   Lelon Perla, PA-C  gabapentin (NEURONTIN) 800 MG tablet Take 800 mg by mouth 3 (three) times daily as needed (for pain/nerves).     [provider]  hydrochlorothiazide (HYDRODIURIL) 25 MG tablet Take 25 mg by mouth daily.    [provider]  hydrocortisone (ANUSOL-HC) 2.5 % rectal cream Place 1 Application rectally 2 (two) times daily. 08/20/23   Lonell Grandchild, MD  lidocaine (XYLOCAINE) 5 % ointment Apply 1 Application topically as needed for moderate pain. 11/25/22   Linwood Dibbles, MD  Lidocaine, Anorectal, (HEMORRHOIDAL RELIEF) 5 % CREA Apply 1 Application topically 5 (five) times daily as needed (HEMORRHOIDS). 08/20/23   Lonell Grandchild, MD  mupirocin ointment (BACTROBAN) 2 % Apply topically 3 (three) times daily. 11/27/23   Lelon Perla, PA-C  pramoxine-hydrocortisone cream Apply topically 3 (three) times daily. 11/27/23   Lelon Perla, PA-C  predniSONE (DELTASONE) 5 MG tablet Take 5 mg by mouth daily with breakfast.    [provider]  sennosides-docusate sodium (SENOKOT-S) 8.6-50 MG tablet Take 1 tablet by mouth at bedtime.    [provider]  tamsulosin (FLOMAX) 0.4 MG CAPS capsule Take 1 capsule (0.4 mg total) by mouth daily after supper. 11/23/22   Margaretmary Dys,  MD      Allergies    Tuberculin purified protein derivative    Review of Systems   Review of Systems  All other systems reviewed and are negative.   Physical Exam Updated Vital Signs BP (!) 167/93 (BP Location: Right Arm)   Pulse 89   Temp 98 F (36.7 C)   Resp 16   Ht 5\' 7"  (1.702 m)   Wt 92 kg   SpO2 97%   BMI 31.77 kg/m  Physical Exam Vitals and nursing note reviewed.  Constitutional:      General: He is not in acute distress.    Appearance: He is well-developed.  HENT:     Head: Atraumatic.  Eyes:      Conjunctiva/sclera: Conjunctivae normal.  Abdominal:     Palpations: Abdomen is soft.     Tenderness: There is no abdominal tenderness.  Genitourinary:    Comments: Chaperone present during exam.  Patient is wearing adult diapers soiled with stool.  He does have moderate amount of skin maceration around the perianal region.  He has a nonthrombosed hemorrhoid at the 9 o'clock position.  He report discomfort with digital rectal exam but no impacted stool and no palpable perirectal abscess noted. Musculoskeletal:     Cervical back: Neck supple.  Skin:    Findings: No rash.  Neurological:     Mental Status: He is alert.     ED Results / Procedures / Treatments   Labs (all labs ordered are listed, but only abnormal results are displayed) Labs Reviewed - No data to display  EKG None  Radiology No results found.  Procedures Procedures    Medications Ordered in ED Medications - No data to display  ED Course/ Medical Decision Making/ A&P                                 Medical Decision Making  BP (!) 167/93 (BP Location: Right Arm)   Pulse 89   Temp 98 F (36.7 C)   Resp 16   Ht 5\' 7"  (1.702 m)   Wt 92 kg   SpO2 97%   BMI 31.77 kg/m   10:65 PM  63 year old male history of prostate cancer, recurrent rectal pain, presenting complaining of hemorrhoid pain.  Patient report ongoing rectal pain for at least 2 years.  Increasing pain when he tried to wipe with bowel movement.  He attributed to his hemorrhoid.  Increasing pain today prompting this ER visit.  States he has been seen evaluate multiple times for this and is not going away.  Does not endorse any abdominal pain nausea and denies constipation.  He has not noticed any bleeding.  Denies any trauma.  Has had a history of prostate cancer status post radiation treatment.  He does have a colonoscopy scheduled this month.  Exam with chaperone present notable for macerated skin changes around the perianal region with a  nonthrombosed hemorrhoid.  He is wearing an adult diaper soiled with stool.  He has normal color stool no active bleeding.  EMR reviewed patient has been seen evaluate multiple times for similar presentation.  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  I felt his macerated skin changes due to poor hygiene causing skin breakdown.  Show encourage patient to cleans his buttock with gentle soap and water and pat it dry multiple times daily to aid with skin healing.  I did offer Anusol cream but patient states he does not like to use it because it burns his skin.  I have considered advanced imaging but I felt that it is low yield as the symptom has been an ongoing issue for the past 2 years that he has had multiple CT scans in the past.  I will defer further evaluation management through his gastroenterologist.  No acute emergent condition identified.  Low suspicion for perirectal abscess or ongoing malignancy.  No evidence of thrombosed hemorrhoid.  Tylenol given for pain, patient requesting for stronger pain medication but I felt opiate medication will likely cause constipation and worsening symptoms.        Final Clinical Impression(s) / ED Diagnoses Final diagnoses:  Rectal pain    Rx / DC Orders ED Discharge Orders          Ordered    hydrocortisone (ANUSOL-HC) 2.5 % rectal cream  2 times daily        12/08/23 1722              Fayrene Helper, PA-C 12/08/23 1722    Eber Hong, MD 12/10/23 1501

## 2023-12-08 NOTE — ED Triage Notes (Signed)
 Pt reports he has been here 4 times and we still have not done anything for his hemorrhoids.

## 2023-12-08 NOTE — Discharge Instructions (Signed)
 Your rectal pain is likely due to skin breakdown around your perianal region.  It is very important for you to perform good hygiene including washing the affected area with gentle soap and water at least twice daily and pat it dry.  Make sure to avoid any prolonged moisture to the affected area for better healing of your skin.  This will help you with your symptoms.  Please follow-up closely with your gastroenterologist outpatient for further managements of your condition.  You may use Anusol prescribed for symptom control

## 2023-12-09 LAB — POC OCCULT BLOOD, ED: Fecal Occult Bld: POSITIVE — AB

## 2023-12-23 ENCOUNTER — Ambulatory Visit (HOSPITAL_COMMUNITY)
Admission: RE | Admit: 2023-12-23 | Payer: No Typology Code available for payment source | Source: Home / Self Care | Admitting: Internal Medicine

## 2023-12-23 ENCOUNTER — Encounter (HOSPITAL_COMMUNITY): Admission: RE | Payer: Self-pay | Source: Home / Self Care

## 2023-12-23 ENCOUNTER — Encounter (HOSPITAL_COMMUNITY): Payer: Self-pay | Admitting: Anesthesiology

## 2023-12-23 SURGERY — COLONOSCOPY WITH PROPOFOL
Anesthesia: Choice

## 2023-12-30 ENCOUNTER — Telehealth: Payer: Self-pay | Admitting: *Deleted

## 2023-12-30 NOTE — Telephone Encounter (Signed)
 LM with spouse to have pt call back to reschedule.   Pt left vm wanting to rescheule his colonoscopy. He said that he had been sick for about 2 weeks.

## 2023-12-31 NOTE — Telephone Encounter (Signed)
 Advised pt that provider is scheduling into May for procedures. We do not have his schedule for May at this time, will call to get him scheduled. Pt verbalized understanding.

## 2024-01-10 ENCOUNTER — Encounter: Payer: Self-pay | Admitting: *Deleted

## 2024-01-10 NOTE — Telephone Encounter (Signed)
 Pt has been rescheduled for 01/30/24. Updated instructions mailed. Pt still had prep let from last time he was scheduled.

## 2024-01-24 ENCOUNTER — Emergency Department (HOSPITAL_COMMUNITY)

## 2024-01-24 ENCOUNTER — Inpatient Hospital Stay (HOSPITAL_COMMUNITY)

## 2024-01-24 ENCOUNTER — Other Ambulatory Visit: Payer: Self-pay

## 2024-01-24 ENCOUNTER — Inpatient Hospital Stay (HOSPITAL_COMMUNITY)
Admission: EM | Admit: 2024-01-24 | Discharge: 2024-02-21 | DRG: 004 | Disposition: A | Discharge status: Nursing Home (Includes ICF) | Attending: Surgery | Admitting: Surgery

## 2024-01-24 ENCOUNTER — Encounter (HOSPITAL_COMMUNITY): Payer: Self-pay

## 2024-01-24 DIAGNOSIS — Z8546 Personal history of malignant neoplasm of prostate: Secondary | ICD-10-CM

## 2024-01-24 DIAGNOSIS — Y95 Nosocomial condition: Secondary | ICD-10-CM | POA: Diagnosis not present

## 2024-01-24 DIAGNOSIS — J9601 Acute respiratory failure with hypoxia: Secondary | ICD-10-CM | POA: Diagnosis not present

## 2024-01-24 DIAGNOSIS — S27321A Contusion of lung, unilateral, initial encounter: Secondary | ICD-10-CM | POA: Diagnosis present

## 2024-01-24 DIAGNOSIS — Y9289 Other specified places as the place of occurrence of the external cause: Secondary | ICD-10-CM | POA: Diagnosis not present

## 2024-01-24 DIAGNOSIS — R34 Anuria and oliguria: Secondary | ICD-10-CM | POA: Diagnosis not present

## 2024-01-24 DIAGNOSIS — R339 Retention of urine, unspecified: Secondary | ICD-10-CM | POA: Diagnosis present

## 2024-01-24 DIAGNOSIS — E875 Hyperkalemia: Secondary | ICD-10-CM | POA: Diagnosis present

## 2024-01-24 DIAGNOSIS — G931 Anoxic brain damage, not elsewhere classified: Secondary | ICD-10-CM | POA: Diagnosis not present

## 2024-01-24 DIAGNOSIS — S2242XA Multiple fractures of ribs, left side, initial encounter for closed fracture: Principal | ICD-10-CM | POA: Diagnosis present

## 2024-01-24 DIAGNOSIS — F32A Depression, unspecified: Secondary | ICD-10-CM | POA: Diagnosis present

## 2024-01-24 DIAGNOSIS — W109XXA Fall (on) (from) unspecified stairs and steps, initial encounter: Secondary | ICD-10-CM | POA: Diagnosis present

## 2024-01-24 DIAGNOSIS — F10939 Alcohol use, unspecified with withdrawal, unspecified: Secondary | ICD-10-CM | POA: Diagnosis present

## 2024-01-24 DIAGNOSIS — S272XXA Traumatic hemopneumothorax, initial encounter: Secondary | ICD-10-CM | POA: Diagnosis present

## 2024-01-24 DIAGNOSIS — Z923 Personal history of irradiation: Secondary | ICD-10-CM

## 2024-01-24 DIAGNOSIS — F4310 Post-traumatic stress disorder, unspecified: Secondary | ICD-10-CM | POA: Diagnosis present

## 2024-01-24 DIAGNOSIS — R6520 Severe sepsis without septic shock: Secondary | ICD-10-CM | POA: Diagnosis not present

## 2024-01-24 DIAGNOSIS — J9621 Acute and chronic respiratory failure with hypoxia: Secondary | ICD-10-CM | POA: Diagnosis not present

## 2024-01-24 DIAGNOSIS — Z888 Allergy status to other drugs, medicaments and biological substances status: Secondary | ICD-10-CM | POA: Diagnosis not present

## 2024-01-24 DIAGNOSIS — C7951 Secondary malignant neoplasm of bone: Secondary | ICD-10-CM | POA: Diagnosis present

## 2024-01-24 DIAGNOSIS — I10 Essential (primary) hypertension: Secondary | ICD-10-CM | POA: Diagnosis present

## 2024-01-24 DIAGNOSIS — J189 Pneumonia, unspecified organism: Secondary | ICD-10-CM | POA: Diagnosis not present

## 2024-01-24 DIAGNOSIS — J449 Chronic obstructive pulmonary disease, unspecified: Secondary | ICD-10-CM | POA: Diagnosis not present

## 2024-01-24 DIAGNOSIS — F17210 Nicotine dependence, cigarettes, uncomplicated: Secondary | ICD-10-CM | POA: Diagnosis present

## 2024-01-24 DIAGNOSIS — K649 Unspecified hemorrhoids: Secondary | ICD-10-CM | POA: Diagnosis present

## 2024-01-24 DIAGNOSIS — G4730 Sleep apnea, unspecified: Secondary | ICD-10-CM | POA: Diagnosis present

## 2024-01-24 DIAGNOSIS — Z930 Tracheostomy status: Secondary | ICD-10-CM | POA: Diagnosis not present

## 2024-01-24 DIAGNOSIS — W19XXXA Unspecified fall, initial encounter: Principal | ICD-10-CM | POA: Diagnosis present

## 2024-01-24 DIAGNOSIS — E876 Hypokalemia: Secondary | ICD-10-CM | POA: Diagnosis present

## 2024-01-24 DIAGNOSIS — J440 Chronic obstructive pulmonary disease with acute lower respiratory infection: Secondary | ICD-10-CM | POA: Diagnosis not present

## 2024-01-24 DIAGNOSIS — K3189 Other diseases of stomach and duodenum: Secondary | ICD-10-CM | POA: Diagnosis present

## 2024-01-24 DIAGNOSIS — Z9911 Dependence on respirator [ventilator] status: Secondary | ICD-10-CM | POA: Diagnosis not present

## 2024-01-24 DIAGNOSIS — K567 Ileus, unspecified: Secondary | ICD-10-CM | POA: Diagnosis not present

## 2024-01-24 DIAGNOSIS — E871 Hypo-osmolality and hyponatremia: Secondary | ICD-10-CM | POA: Diagnosis present

## 2024-01-24 DIAGNOSIS — Z7952 Long term (current) use of systemic steroids: Secondary | ICD-10-CM

## 2024-01-24 DIAGNOSIS — J13 Pneumonia due to Streptococcus pneumoniae: Secondary | ICD-10-CM | POA: Diagnosis not present

## 2024-01-24 DIAGNOSIS — S298XXA Other specified injuries of thorax, initial encounter: Secondary | ICD-10-CM

## 2024-01-24 LAB — CBC WITH DIFFERENTIAL/PLATELET
Abs Immature Granulocytes: 0.15 10*3/uL — ABNORMAL HIGH (ref 0.00–0.07)
Basophils Absolute: 0 10*3/uL (ref 0.0–0.1)
Basophils Relative: 0 %
Eosinophils Absolute: 0 10*3/uL (ref 0.0–0.5)
Eosinophils Relative: 0 %
HCT: 38.9 % — ABNORMAL LOW (ref 39.0–52.0)
Hemoglobin: 13.2 g/dL (ref 13.0–17.0)
Immature Granulocytes: 1 %
Lymphocytes Relative: 3 %
Lymphs Abs: 0.5 10*3/uL — ABNORMAL LOW (ref 0.7–4.0)
MCH: 28 pg (ref 26.0–34.0)
MCHC: 33.9 g/dL (ref 30.0–36.0)
MCV: 82.4 fL (ref 80.0–100.0)
Monocytes Absolute: 0.7 10*3/uL (ref 0.1–1.0)
Monocytes Relative: 5 %
Neutro Abs: 13.5 10*3/uL — ABNORMAL HIGH (ref 1.7–7.7)
Neutrophils Relative %: 91 %
Platelets: 203 10*3/uL (ref 150–400)
RBC: 4.72 MIL/uL (ref 4.22–5.81)
RDW: 13.5 % (ref 11.5–15.5)
WBC: 14.9 10*3/uL — ABNORMAL HIGH (ref 4.0–10.5)
nRBC: 0 % (ref 0.0–0.2)

## 2024-01-24 LAB — COMPREHENSIVE METABOLIC PANEL WITH GFR
ALT: 22 U/L (ref 0–44)
AST: 35 U/L (ref 15–41)
Albumin: 4.6 g/dL (ref 3.5–5.0)
Alkaline Phosphatase: 91 U/L (ref 38–126)
Anion gap: 15 (ref 5–15)
BUN: 6 mg/dL — ABNORMAL LOW (ref 8–23)
CO2: 24 mmol/L (ref 22–32)
Calcium: 9.2 mg/dL (ref 8.9–10.3)
Chloride: 83 mmol/L — ABNORMAL LOW (ref 98–111)
Creatinine, Ser: 0.4 mg/dL — ABNORMAL LOW (ref 0.61–1.24)
GFR, Estimated: >60 mL/min (ref >60)
Glucose, Bld: 124 mg/dL — ABNORMAL HIGH (ref 70–99)
Potassium: 2.8 mmol/L — ABNORMAL LOW (ref 3.5–5.1)
Sodium: 122 mmol/L — ABNORMAL LOW (ref 135–145)
Total Bilirubin: 1.4 mg/dL — ABNORMAL HIGH (ref 0.0–1.2)
Total Protein: 8.5 g/dL — ABNORMAL HIGH (ref 6.5–8.1)

## 2024-01-24 LAB — ETHANOL: Alcohol, Ethyl (B): <15 mg/dL (ref <15)

## 2024-01-24 LAB — MRSA NEXT GEN BY PCR, NASAL: MRSA by PCR Next Gen: NOT DETECTED

## 2024-01-24 LAB — HIV ANTIBODY (ROUTINE TESTING W REFLEX): HIV Screen 4th Generation wRfx: NONREACTIVE

## 2024-01-24 LAB — TYPE AND SCREEN
ABO/RH(D): O POS
Antibody Screen: NEGATIVE

## 2024-01-24 LAB — MAGNESIUM: Magnesium: 1.7 mg/dL (ref 1.7–2.4)

## 2024-01-24 LAB — LIPASE, BLOOD: Lipase: 20 U/L (ref 11–51)

## 2024-01-24 MED ORDER — THIAMINE MONONITRATE 100 MG PO TABS
100.0000 mg | ORAL_TABLET | Freq: Every day | ORAL | Status: DC
  Administered 2024-01-24 – 2024-02-01 (×9): 100 mg via ORAL
  Filled 2024-01-24 (×9): qty 1

## 2024-01-24 MED ORDER — IPRATROPIUM-ALBUTEROL 0.5-2.5 (3) MG/3ML IN SOLN
3.0000 mL | Freq: Once | RESPIRATORY_TRACT | Status: AC
  Administered 2024-01-24: 3 mL via RESPIRATORY_TRACT
  Filled 2024-01-24: qty 3

## 2024-01-24 MED ORDER — CEFAZOLIN SODIUM-DEXTROSE 2-4 GM/100ML-% IV SOLN
2.0000 g | INTRAVENOUS | Status: AC
  Administered 2024-01-24: 2 g via INTRAVENOUS
  Filled 2024-01-24: qty 100

## 2024-01-24 MED ORDER — LORAZEPAM 1 MG PO TABS: 0.0000 mg | ORAL_TABLET | Freq: Four times a day (QID) | ORAL | Status: DC

## 2024-01-24 MED ORDER — POTASSIUM CHLORIDE 10 MEQ/100ML IV SOLN
10.0000 meq | INTRAVENOUS | Status: AC
  Administered 2024-01-24 (×2): 10 meq via INTRAVENOUS
  Filled 2024-01-24 (×2): qty 100

## 2024-01-24 MED ORDER — ONDANSETRON 4 MG PO TBDP
4.0000 mg | ORAL_TABLET | Freq: Four times a day (QID) | ORAL | Status: DC | PRN
Start: 2024-01-24 — End: 2024-02-21

## 2024-01-24 MED ORDER — LORAZEPAM 2 MG/ML IJ SOLN: 0.0000 mg | Freq: Four times a day (QID) | INTRAMUSCULAR | Status: DC

## 2024-01-24 MED ORDER — ACETAMINOPHEN 500 MG PO TABS
1000.0000 mg | ORAL_TABLET | Freq: Once | ORAL | Status: AC
  Administered 2024-01-24: 1000 mg via ORAL
  Filled 2024-01-24: qty 2

## 2024-01-24 MED ORDER — THIAMINE MONONITRATE 100 MG PO TABS: 100.0000 mg | ORAL_TABLET | ORAL | Status: DC

## 2024-01-24 MED ORDER — THIAMINE HCL 100 MG/ML IJ SOLN
100.0000 mg | Freq: Every day | INTRAMUSCULAR | Status: DC
  Filled 2024-01-24: qty 2

## 2024-01-24 MED ORDER — THIAMINE HCL 100 MG/ML IJ SOLN: 200.0000 mg | INTRAVENOUS | Status: DC

## 2024-01-24 MED ORDER — OXYCODONE HCL 5 MG PO TABS
5.0000 mg | ORAL_TABLET | ORAL | Status: DC | PRN
  Administered 2024-01-24: 5 mg via ORAL
  Administered 2024-01-25 (×3): 10 mg via ORAL
  Administered 2024-01-25: 5 mg via ORAL
  Administered 2024-01-26 – 2024-01-31 (×12): 10 mg via ORAL
  Filled 2024-01-24 (×4): qty 2
  Filled 2024-01-24: qty 1
  Filled 2024-01-24 (×9): qty 2
  Filled 2024-01-24: qty 1
  Filled 2024-01-24 (×2): qty 2

## 2024-01-24 MED ORDER — HYDROMORPHONE HCL 1 MG/ML IJ SOLN
0.5000 mg | Freq: Once | INTRAMUSCULAR | Status: AC
  Administered 2024-01-24: 0.5 mg via INTRAVENOUS
  Filled 2024-01-24: qty 0.5

## 2024-01-24 MED ORDER — METOPROLOL TARTRATE 5 MG/5ML IV SOLN
5.0000 mg | Freq: Four times a day (QID) | INTRAVENOUS | Status: DC | PRN
  Administered 2024-01-25: 5 mg via INTRAVENOUS
  Filled 2024-01-24: qty 5

## 2024-01-24 MED ORDER — DOCUSATE SODIUM 100 MG PO CAPS
100.0000 mg | ORAL_CAPSULE | Freq: Two times a day (BID) | ORAL | Status: DC
  Administered 2024-01-25 – 2024-01-31 (×10): 100 mg via ORAL
  Filled 2024-01-24 (×15): qty 1

## 2024-01-24 MED ORDER — IOHEXOL 300 MG/ML  SOLN
100.0000 mL | Freq: Once | INTRAMUSCULAR | Status: AC | PRN
  Administered 2024-01-24: 100 mL via INTRAVENOUS

## 2024-01-24 MED ORDER — CHLORHEXIDINE GLUCONATE CLOTH 2 % EX PADS
6.0000 | MEDICATED_PAD | Freq: Every day | CUTANEOUS | Status: DC
  Administered 2024-01-24 – 2024-02-02 (×9): 6 via TOPICAL

## 2024-01-24 MED ORDER — MORPHINE SULFATE (PF) 4 MG/ML IV SOLN
4.0000 mg | INTRAVENOUS | Status: DC | PRN
  Administered 2024-01-25 – 2024-01-30 (×4): 4 mg via INTRAVENOUS
  Filled 2024-01-24 (×4): qty 1

## 2024-01-24 MED ORDER — DEXMEDETOMIDINE HCL IN NACL 400 MCG/100ML IV SOLN
0.0000 ug/kg/h | INTRAVENOUS | Status: DC
Start: 2024-01-24 — End: 2024-01-31
  Administered 2024-01-26 (×2): 0.4 ug/kg/h via INTRAVENOUS
  Administered 2024-01-27: 0.5 ug/kg/h via INTRAVENOUS
  Administered 2024-01-27: 0.4 ug/kg/h via INTRAVENOUS
  Administered 2024-01-28 (×2): 0.7 ug/kg/h via INTRAVENOUS
  Administered 2024-01-28: 0.4 ug/kg/h via INTRAVENOUS
  Administered 2024-01-29: 1.2 ug/kg/h via INTRAVENOUS
  Administered 2024-01-29: 0.6 ug/kg/h via INTRAVENOUS
  Administered 2024-01-29: 0.9 ug/kg/h via INTRAVENOUS
  Administered 2024-01-29: 0.6 ug/kg/h via INTRAVENOUS
  Administered 2024-01-30: 0.5 ug/kg/h via INTRAVENOUS
  Filled 2024-01-24 (×12): qty 100

## 2024-01-24 MED ORDER — METHOCARBAMOL 500 MG PO TABS
500.0000 mg | ORAL_TABLET | Freq: Three times a day (TID) | ORAL | Status: AC
  Administered 2024-01-24 – 2024-01-27 (×8): 500 mg via ORAL
  Filled 2024-01-24 (×8): qty 1

## 2024-01-24 MED ORDER — ONDANSETRON HCL 4 MG/2ML IJ SOLN
4.0000 mg | Freq: Once | INTRAMUSCULAR | Status: AC
  Administered 2024-01-24: 4 mg via INTRAVENOUS
  Filled 2024-01-24: qty 2

## 2024-01-24 MED ORDER — POLYETHYLENE GLYCOL 3350 17 G PO PACK
17.0000 g | PACK | Freq: Every day | ORAL | Status: DC | PRN
  Administered 2024-01-27: 17 g via ORAL
  Filled 2024-01-24: qty 1

## 2024-01-24 MED ORDER — LIDOCAINE 5 % EX PTCH
2.0000 | MEDICATED_PATCH | CUTANEOUS | Status: DC
  Administered 2024-01-24 – 2024-02-21 (×29): 2 via TRANSDERMAL
  Filled 2024-01-24 (×30): qty 2

## 2024-01-24 MED ORDER — LORAZEPAM 2 MG/ML IJ SOLN: 0.0000 mg | Freq: Two times a day (BID) | INTRAMUSCULAR | Status: DC

## 2024-01-24 MED ORDER — ONDANSETRON HCL 4 MG/2ML IJ SOLN
4.0000 mg | Freq: Four times a day (QID) | INTRAMUSCULAR | Status: DC | PRN
  Administered 2024-01-25 – 2024-01-31 (×4): 4 mg via INTRAVENOUS
  Filled 2024-01-24 (×4): qty 2

## 2024-01-24 MED ORDER — ADULT MULTIVITAMIN W/MINERALS CH
1.0000 | ORAL_TABLET | Freq: Every day | ORAL | Status: DC
  Administered 2024-01-24 – 2024-01-28 (×5): 1 via ORAL
  Filled 2024-01-24 (×5): qty 1

## 2024-01-24 MED ORDER — ACETAMINOPHEN 500 MG PO TABS
1000.0000 mg | ORAL_TABLET | Freq: Four times a day (QID) | ORAL | Status: DC
  Administered 2024-01-24 – 2024-01-31 (×26): 1000 mg via ORAL
  Filled 2024-01-24 (×27): qty 2

## 2024-01-24 MED ORDER — ALBUTEROL SULFATE (2.5 MG/3ML) 0.083% IN NEBU
2.5000 mg | INHALATION_SOLUTION | RESPIRATORY_TRACT | Status: DC
  Administered 2024-01-24 – 2024-01-25 (×6): 2.5 mg via RESPIRATORY_TRACT
  Filled 2024-01-24 (×7): qty 3

## 2024-01-24 MED ORDER — METHYLPREDNISOLONE SODIUM SUCC 125 MG IJ SOLR
125.0000 mg | Freq: Once | INTRAMUSCULAR | Status: AC
  Administered 2024-01-24: 125 mg via INTRAVENOUS
  Filled 2024-01-24: qty 2

## 2024-01-24 MED ORDER — SODIUM CHLORIDE 0.9 % IV BOLUS
1000.0000 mL | Freq: Once | INTRAVENOUS | Status: AC
  Administered 2024-01-24: 1000 mL via INTRAVENOUS

## 2024-01-24 MED ORDER — METHOCARBAMOL 1000 MG/10ML IJ SOLN
500.0000 mg | Freq: Three times a day (TID) | INTRAMUSCULAR | Status: AC
  Administered 2024-01-27: 500 mg via INTRAVENOUS
  Filled 2024-01-24: qty 10

## 2024-01-24 MED ORDER — MORPHINE SULFATE (PF) 4 MG/ML IV SOLN
4.0000 mg | Freq: Once | INTRAVENOUS | Status: AC
  Administered 2024-01-24: 4 mg via INTRAVENOUS
  Filled 2024-01-24: qty 1

## 2024-01-24 MED ORDER — HYDRALAZINE HCL 20 MG/ML IJ SOLN
10.0000 mg | INTRAMUSCULAR | Status: DC | PRN
  Administered 2024-01-25 – 2024-02-19 (×3): 10 mg via INTRAVENOUS
  Filled 2024-01-24 (×3): qty 1

## 2024-01-24 MED ORDER — FOLIC ACID 1 MG PO TABS
1.0000 mg | ORAL_TABLET | Freq: Every day | ORAL | Status: DC
  Administered 2024-01-24 – 2024-01-31 (×8): 1 mg via ORAL
  Filled 2024-01-24 (×8): qty 1

## 2024-01-24 MED ORDER — LORAZEPAM 1 MG PO TABS: 0.0000 mg | ORAL_TABLET | Freq: Two times a day (BID) | ORAL | Status: DC

## 2024-01-24 MED ORDER — ENOXAPARIN SODIUM 30 MG/0.3ML IJ SOSY
30.0000 mg | PREFILLED_SYRINGE | Freq: Two times a day (BID) | INTRAMUSCULAR | Status: DC
  Administered 2024-01-25 – 2024-02-21 (×55): 30 mg via SUBCUTANEOUS
  Filled 2024-01-24 (×56): qty 0.3

## 2024-01-24 NOTE — ED Notes (Signed)
 Patient transported to CT

## 2024-01-24 NOTE — ED Notes (Signed)
 Pt became confused and ripped out both IVs, removed cords, and tried to get out of the bed. When this RN asked pt where he was going he informed me he was going home. Pt informed he was still in the ED and was awaiting a bed at [facility]. Pt stated he didn't realize he was still at the hospital. Pt cleaned up and bed changed. Pt assisted back into the stretcher at this time.

## 2024-01-24 NOTE — ED Triage Notes (Addendum)
 Pt arrived from home via [facility] s/p fall in which he fell against his outdoor steps hurting his left side. Pt noted to be sob. 4lpm via [place] to keep sats 90 at present. ETOH on board

## 2024-01-24 NOTE — H&P (Addendum)
 CC: Fall, rib fractures   HPI: Jason Rojas is an 63 y.o. male  COPH, HTN, and prostate CA who presented to [facility] after a fall outside. Fell walking up some steps, lading on his left side. Had a second fall while trying to get up. His cc is left chest wall pain. ED workup significant for L Rib FX 3-10, hemopneumothorax, and pulmonary contusion. EtOH level <15. He does have a history of daily alcohol  use.    Arrived this evening as transfer from [facility].  Currently, he reports that he actually fell a day or 2 ago on his porch steps while inebriated.  He reports that he did not lose consciousness or strike his head.  He reports he landed squarely on the steps on his left chest.  Reports chest wall pain with deep inspiration.  He denies any pain in his right chest.  He states that he has no pain in his head, neck, back, abdomen/pelvis, or any extremity.  He denies any other symptoms or issues at present.  He reports a heavy history of alcohol  use.  He reports that daily he drinks 15 cans of beer.  He does report a history of seizures but is unsure why he has had seizures but thinks it could be related to alcohol .  Past Medical History:  Diagnosis Date   Cancer [facility])    prostate   Chronic back pain    COPD (chronic obstructive pulmonary disease) (HCC)    Depression    Hypertension    PTSD (post-traumatic stress disorder)    Sleep apnea     Past Surgical History:  Procedure Laterality Date   APPENDECTOMY     BIOPSY  03/23/2019   Procedure: BIOPSY;  Surgeon: Suzette Espy, MD;  [facility];  Service: Endoscopy;;  gastric   COLONOSCOPY     2010 and 2015, adenomas   COLONOSCOPY WITH PROPOFOL  N/A 03/23/2019   Procedure: COLONOSCOPY WITH PROPOFOL ;  Surgeon: Suzette Espy, MD;  [facility];  Service: Endoscopy;  Laterality: N/A;  1:30pm   ESOPHAGOGASTRODUODENOSCOPY (EGD) WITH PROPOFOL  N/A 03/23/2019   Procedure: ESOPHAGOGASTRODUODENOSCOPY (EGD)  WITH PROPOFOL ;  Surgeon: Suzette Espy, MD;  [facility];  Service: Endoscopy;  Laterality: N/A;   POLYPECTOMY  03/23/2019   Procedure: POLYPECTOMY;  Surgeon: Suzette Espy, MD;  [facility];  Service: Endoscopy;;  colon   TONSILLECTOMY      Family History  Problem Relation Age of Onset   Colon cancer Neg Hx     Social:  reports that he has been smoking cigarettes. He has never used smokeless tobacco. He reports current alcohol  use of about 35.0 standard drinks of alcohol  per week. He reports that he does not currently use drugs after having used the following drugs: Cocaine.  Allergies:  Allergies  Allergen Reactions   Tuberculin Purified Protein Derivative Swelling    Medications: I have reviewed the patient's current medications.  Results for orders placed or performed during the hospital encounter of 01/24/24 (from the past 48 hours)  Comprehensive metabolic panel     Status: Abnormal   Collection Time: 01/24/24  6:29 AM  Result Value Ref Range   Sodium 122 (L) 135 - 145 mmol/L   Potassium 2.8 (L) 3.5 - 5.1 mmol/L   Chloride 83 (L) 98 - 111 mmol/L   CO2 24 22 - 32 mmol/L   Glucose, Bld 124 (H) 70 - 99 mg/dL  Comment: Glucose reference range applies only to samples taken after fasting for at least 8 hours.   BUN 6 (L) 8 - 23 mg/dL   Creatinine, Ser 1.02 (L) 0.61 - 1.24 mg/dL   Calcium 9.2 8.9 - 72.5 mg/dL   Total Protein 8.5 (H) 6.5 - 8.1 g/dL   Albumin 4.6 3.5 - 5.0 g/dL   AST 35 15 - 41 U/L   ALT 22 0 - 44 U/L   Alkaline Phosphatase 91 38 - 126 U/L   Total Bilirubin 1.4 (H) 0.0 - 1.2 mg/dL   GFR, Estimated >36 >64 mL/min    Comment: (NOTE) Calculated using the CKD-EPI Creatinine Equation (2021)    Anion gap 15 5 - 15    Comment: Performed at [facility], [address], [place], [place] [address]  Ethanol     Status: None   Collection Time: 01/24/24  6:29 AM  Result Value Ref Range   Alcohol, Ethyl (B) <15 <15 mg/dL    Comment:  Please note change in reference range. (NOTE) For medical purposes only. Performed at [facility], [address] [place], [place] [address]   Lipase, blood     Status: None   Collection Time: 01/24/24  6:29 AM  Result Value Ref Range   Lipase 20 11 - 51 U/L    Comment: Performed at [facility], [address] [place], [place] [address]  CBC with Differential     Status: Abnormal   Collection Time: 01/24/24  6:29 AM  Result Value Ref Range   WBC 14.9 (H) 4.0 - 10.5 K/uL   RBC 4.72 4.22 - 5.81 MIL/uL   Hemoglobin 13.2 13.0 - 17.0 g/dL   HCT 64.3 (L) 32.9 - 51.8 %   MCV 82.4 80.0 - 100.0 fL   MCH 28.0 26.0 - 34.0 pg   MCHC 33.9 30.0 - 36.0 g/dL   RDW 84.1 66.0 - 63.0 %   Platelets 203 150 - 400 K/uL   nRBC 0.0 0.0 - 0.2 %   Neutrophils Relative % 91 %   Neutro Abs 13.5 (H) 1.7 - 7.7 K/uL   Lymphocytes Relative 3 %   Lymphs Abs 0.5 (L) 0.7 - 4.0 K/uL   Monocytes Relative 5 %   Monocytes Absolute 0.7 0.1 - 1.0 K/uL   Eosinophils Relative 0 %   Eosinophils Absolute 0.0 0.0 - 0.5 K/uL   Basophils Relative 0 %   Basophils Absolute 0.0 0.0 - 0.1 K/uL   Immature Granulocytes 1 %   Abs Immature Granulocytes 0.15 (H) 0.00 - 0.07 K/uL    Comment: Performed at [facility], [address], [place], [place] [address]  Type and screen [facility]     Status: None   Collection Time: 01/24/24  6:29 AM  Result Value Ref Range   ABO/RH(D) O POS    Antibody Screen NEG    Sample Expiration      01/27/2024,2359 Performed at [facility], [address] [place], [place] [address]   Magnesium     Status: None   Collection Time: 01/24/24  6:30 AM  Result Value Ref Range   Magnesium 1.7 1.7 - 2.4 mg/dL    Comment: Performed at [facility], [address], [place], [place] [address]    DG Chest Portable 1 View Result Date: 01/24/2024 CLINICAL DATA:  Marvell Slider twice today on steps. Follow-up small left pneumothorax with extensive left subcutaneous emphysema. Multiple left rib fractures.  EXAM: PORTABLE CHEST 1 VIEW COMPARISON:  Portable chest and chest CT earlier today. FINDINGS: Multiple displaced left rib fractures are again demonstrated. Small left lateral pneumothorax with an interval increase in size. This currently occupies proximally 5-10% of the volume of the left hemithorax. No significant change in extensive subcutaneous emphysema on the left. Increased patchy density at the left lung base. Interval minimal linear atelectasis at the right lung base. Very poor depth of inspiration. Grossly stable mildly enlarged cardiac silhouette. IMPRESSION: 1. Interval increase in size of a small left lateral pneumothorax, currently occupying 5-10% of the volume of the left hemithorax. 2. No significant change in extensive subcutaneous emphysema on the left. 3. Increased patchy density at the left lung base, likely due to atelectasis. 4. Interval minimal linear atelectasis at the right lung base. 5. Grossly stable mild cardiomegaly. Electronically Signed   By: Catherin Closs M.D.   On: 01/24/2024 13:21   DG Chest Port 1 View Result Date: 01/24/2024 CLINICAL DATA:  Fall striking left side. EXAM: PORTABLE CHEST 1 VIEW COMPARISON:  PET-CT 07/23/2022 FINDINGS: Subcutaneous emphysema on the left with small left pneumothorax observed. Multiple rib fractures on the left to be detailed in the dedicated chest CT. Mild atelectasis at the lung bases. Apical lordotic projection. Mild enlargement of the cardiopericardial silhouette. IMPRESSION: 1. Small left pneumothorax with extensive left subcutaneous emphysema. 2. Multiple rib fractures on the left to be detailed in the dedicated chest CT. 3. Mild enlargement of the cardiopericardial silhouette. 4. Mild atelectasis at the lung bases. Electronically Signed   By: Freida Jes M.D.   On: 01/24/2024 08:34   CT CHEST ABDOMEN PELVIS W CONTRAST Result Date: 01/24/2024 CLINICAL DATA:  Fall striking stairs with the left chest, shortness of breath EXAM: CT CHEST,  ABDOMEN, AND PELVIS WITH CONTRAST TECHNIQUE: Multidetector CT imaging of the chest, abdomen and pelvis was performed following the standard protocol during bolus administration of intravenous contrast. RADIATION DOSE REDUCTION: This exam was performed according to the departmental dose-optimization program which includes automated exposure control, adjustment of the mA and/or kV according to patient size and/or use of iterative reconstruction technique. CONTRAST:  OMNIPAQUE  IOHEXOL  300 MG/ML  SOLN COMPARISON:  11/27/2023 and PET-CT from 07/23/2022 FINDINGS: CT CHEST FINDINGS Cardiovascular: Thoracic aortic atheromatous vascular calcification. Mild cardiomegaly. Mediastinum/Nodes: Trace pneumomediastinum extending in from the left chest and left neck region. Gas within mildly dilated thoracic esophagus. Lungs/Pleura: Left hydropneumothorax with pneumothorax about 10% of hemithoracic volume, and a small to moderate amount of free-flowing pleural fluid on the left with associated passive atelectasis. Stable right apical paraseptal bulla. Small left upper lobe pulmonary contusion. Mild atelectasis peripherally in the right middle lobe. Musculoskeletal: Segmental acute fractures with multiple displaced components involving the left third, fourth, fifth, and sixth ribs with nonsegmental lateral fractures of the left seventh, eighth, ninth, and tenth ribs. Extensive gas tracking along the left chest wall including the pectoralis musculature, left axilla and lateral breast, along the latissimus dorsi, and up into the left neck as well as tracking down through the thoracic inlet into the left upper mediastinum. Old bilateral rib fractures are also noted and appear healed. Degenerative endplate sclerosis at T1-2. CT ABDOMEN PELVIS FINDINGS Hepatobiliary: Unremarkable Pancreas: Unremarkable Spleen: No visible splenic laceration or perisplenic ascites. Adrenals/Urinary Tract: Bosniak category 1 cyst of the right kidney  lower pole warrants no further imaging workup. Otherwise unremarkable. Stomach/Bowel: Unremarkable Vascular/Lymphatic: Atherosclerosis is present, including aortoiliac atherosclerotic disease. Substantial atheromatous plaque noted proximally in the superior mesenteric artery as on image 114 series 6,  potentially causing substantial stenosis but without overt occlusion. Reproductive: No obvious prostate asymmetry in this patient known prostate cancer. Other: No supplemental non-categorized findings. Musculoskeletal: Grade 1 degenerative anterolisthesis at L4-5 with degenerative endplate sclerosis and spurring at the L4-5 level. Fatty right spermatic cord. IMPRESSION: 1. Left hydropneumothorax with pneumothorax component about 10% of hemithoracic volume, and a small to moderate amount of free-flowing pleural fluid on the left with associated passive atelectasis. 2. Segmental acute fractures with multiple displaced components involving the left third, fourth, fifth, and sixth ribs with nonsegmental lateral fractures of the left seventh, eighth, ninth, and tenth ribs. Risk for flail chest. No definite sites of active bleeding identified. 3. Small left upper lobe pulmonary contusion. 4. Extensive gas tracking along the left chest wall including the pectoralis musculature, left axilla and lateral breast, along the latissimus dorsi, and up into the left neck as well as tracking down through the thoracic inlet into the left upper mediastinum. 5. Trace pneumomediastinum extending in from the left chest and left neck region. 6. Substantial atheromatous plaque proximally in the superior mesenteric artery, potentially causing substantial stenosis but without overt occlusion. 7. Grade 1 degenerative anterolisthesis at L4-5 with degenerative endplate sclerosis and spurring at the L4-5 level. 8. Fatty right spermatic cord. 9.  Aortic Atherosclerosis (ICD10-I70.0). Critical Value/emergent results were called by telephone at the  time of interpretation on 01/24/2024 at 8:25 am to provider Dr. Adan Holms, who verbally acknowledged these results. Electronically Signed   By: Freida Jes M.D.   On: 01/24/2024 08:26   CT Head Wo Contrast Result Date: 01/24/2024 CLINICAL DATA:  Blunt poly trauma EXAM: CT HEAD WITHOUT CONTRAST CT CERVICAL SPINE WITHOUT CONTRAST TECHNIQUE: Multidetector CT imaging of the head and cervical spine was performed following the standard protocol without intravenous contrast. Multiplanar CT image reconstructions of the cervical spine were also generated. RADIATION DOSE REDUCTION: This exam was performed according to the departmental dose-optimization program which includes automated exposure control, adjustment of the mA and/or kV according to patient size and/or use of iterative reconstruction technique. COMPARISON:  None Available. FINDINGS: CT HEAD FINDINGS Brain: No evidence of acute infarction, hemorrhage, hydrocephalus, extra-axial collection or mass lesion/mass effect. Brain atrophy that is generalized. Vascular: No hyperdense vessel or unexpected calcification. Skull: No acute fracture Sinuses/Orbits: No evidence of injury CT CERVICAL SPINE FINDINGS Alignment: Reversal of cervical lordosis considered degenerative. Mild C2-3 anterolisthesis Skull base and vertebrae: No acute fracture Soft tissues and spinal canal: No prevertebral fluid or swelling. No visible canal hematoma. Soft tissue emphysema arising from the chest based on dedicated CT imaging of the body. Disc levels: Advanced and generalized degeneration of disc spaces and facets with multilevel foraminal impingement. Upper chest: Reported separately IMPRESSION: No evidence of acute intracranial or cervical spine injury. Soft tissue emphysema arising from left chest injury. Electronically Signed   By: Ronnette Coke M.D.   On: 01/24/2024 07:44   CT Cervical Spine Wo Contrast Result Date: 01/24/2024 CLINICAL DATA:  Blunt poly trauma EXAM: CT HEAD  WITHOUT CONTRAST CT CERVICAL SPINE WITHOUT CONTRAST TECHNIQUE: Multidetector CT imaging of the head and cervical spine was performed following the standard protocol without intravenous contrast. Multiplanar CT image reconstructions of the cervical spine were also generated. RADIATION DOSE REDUCTION: This exam was performed according to the departmental dose-optimization program which includes automated exposure control, adjustment of the mA and/or kV according to patient size and/or use of iterative reconstruction technique. COMPARISON:  None Available. FINDINGS: CT HEAD FINDINGS Brain: No evidence of acute  infarction, hemorrhage, hydrocephalus, extra-axial collection or mass lesion/mass effect. Brain atrophy that is generalized. Vascular: No hyperdense vessel or unexpected calcification. Skull: No acute fracture Sinuses/Orbits: No evidence of injury CT CERVICAL SPINE FINDINGS Alignment: Reversal of cervical lordosis considered degenerative. Mild C2-3 anterolisthesis Skull base and vertebrae: No acute fracture Soft tissues and spinal canal: No prevertebral fluid or swelling. No visible canal hematoma. Soft tissue emphysema arising from the chest based on dedicated CT imaging of the body. Disc levels: Advanced and generalized degeneration of disc spaces and facets with multilevel foraminal impingement. Upper chest: Reported separately IMPRESSION: No evidence of acute intracranial or cervical spine injury. Soft tissue emphysema arising from left chest injury. Electronically Signed   By: Ronnette Coke M.D.   On: 01/24/2024 07:44    ROS - all of the below systems have been reviewed with the patient and positives are indicated with bold text General: chills, fever or night sweats Eyes: blurry vision or double vision ENT: epistaxis or sore throat Allergy/Immunology: itchy/watery eyes or nasal congestion Hematologic/Lymphatic: bleeding problems, blood clots or swollen lymph nodes Endocrine: temperature  intolerance or unexpected weight changes Breast: new or changing breast lumps or nipple discharge Resp: cough, shortness of breath, or wheezing CV: chest pain or dyspnea on exertion GI: nausea, vomiting, abdominal pain GU: dysuria, trouble voiding, or hematuria MSK: chest wall pain; joint pain or joint stiffness Neuro: TIA or stroke symptoms Derm: pruritus and skin lesion changes Psych: anxiety and depression  PE Blood pressure (!) 171/88, pulse (!) 106, temperature 98.5 F (36.9 C), temperature source Oral, resp. rate (!) 24, height 5\' 7"  (1.702 m), weight 92 kg, SpO2 93%. Physical Exam Constitutional: NAD; conversant; no deformities Eyes: Moist conjunctiva; no lid lag; anicteric; PERRL Neck: Trachea midline; no thyromegaly Lungs: Normal respiratory effort; CTAB; no tactile fremitus CV: RRR; no palpable thrills; no pitting edema GI: Abd soft, NT/ND; no palpable hepatosplenomegaly MSK: Normal range of motion of extremities; no clubbing/cyanosis; no deformities Psychiatric: Appropriate affect; alert and oriented x3 Lymphatic: No palpable cervical or axillary lymphadenopathy  Results for orders placed or performed during the hospital encounter of 01/24/24 (from the past 48 hours)  Comprehensive metabolic panel     Status: Abnormal   Collection Time: 01/24/24  6:29 AM  Result Value Ref Range   Sodium 122 (L) 135 - 145 mmol/L   Potassium 2.8 (L) 3.5 - 5.1 mmol/L   Chloride 83 (L) 98 - 111 mmol/L   CO2 24 22 - 32 mmol/L   Glucose, Bld 124 (H) 70 - 99 mg/dL    Comment: Glucose reference range applies only to samples taken after fasting for at least 8 hours.   BUN 6 (L) 8 - 23 mg/dL   Creatinine, Ser 1.61 (L) 0.61 - 1.24 mg/dL   Calcium 9.2 8.9 - 09.6 mg/dL   Total Protein 8.5 (H) 6.5 - 8.1 g/dL   Albumin 4.6 3.5 - 5.0 g/dL   AST 35 15 - 41 U/L   ALT 22 0 - 44 U/L   Alkaline Phosphatase 91 38 - 126 U/L   Total Bilirubin 1.4 (H) 0.0 - 1.2 mg/dL   GFR, Estimated >04 >54 mL/min     Comment: (NOTE) Calculated using the CKD-EPI Creatinine Equation (2021)    Anion gap 15 5 - 15    Comment: Performed at [facility], [address], [place], [place] [address]  Ethanol     Status: None   Collection Time: 01/24/24  6:29 AM  Result Value Ref  Range   Alcohol, Ethyl (B) <15 <15 mg/dL    Comment: Please note change in reference range. (NOTE) For medical purposes only. Performed at [facility], [address], [place], [place] [address]   Lipase, blood     Status: None   Collection Time: 01/24/24  6:29 AM  Result Value Ref Range   Lipase 20 11 - 51 U/L    Comment: Performed at [facility], [address] [place], [place] [address]  CBC with Differential     Status: Abnormal   Collection Time: 01/24/24  6:29 AM  Result Value Ref Range   WBC 14.9 (H) 4.0 - 10.5 K/uL   RBC 4.72 4.22 - 5.81 MIL/uL   Hemoglobin 13.2 13.0 - 17.0 g/dL   HCT 01.0 (L) 27.2 - 53.6 %   MCV 82.4 80.0 - 100.0 fL   MCH 28.0 26.0 - 34.0 pg   MCHC 33.9 30.0 - 36.0 g/dL   RDW 64.4 03.4 - 74.2 %   Platelets 203 150 - 400 K/uL   nRBC 0.0 0.0 - 0.2 %   Neutrophils Relative % 91 %   Neutro Abs 13.5 (H) 1.7 - 7.7 K/uL   Lymphocytes Relative 3 %   Lymphs Abs 0.5 (L) 0.7 - 4.0 K/uL   Monocytes Relative 5 %   Monocytes Absolute 0.7 0.1 - 1.0 K/uL   Eosinophils Relative 0 %   Eosinophils Absolute 0.0 0.0 - 0.5 K/uL   Basophils Relative 0 %   Basophils Absolute 0.0 0.0 - 0.1 K/uL   Immature Granulocytes 1 %   Abs Immature Granulocytes 0.15 (H) 0.00 - 0.07 K/uL    Comment: Performed at [facility], [address] [place], [place] [address]  Type and screen [facility]     Status: None   Collection Time: 01/24/24  6:29 AM  Result Value Ref Range   ABO/RH(D) O POS    Antibody Screen NEG    Sample Expiration      01/27/2024,2359 Performed at [facility], [address] [place], [place] [address]   Magnesium     Status: None   Collection Time: 01/24/24  6:30 AM  Result Value Ref Range    Magnesium 1.7 1.7 - 2.4 mg/dL    Comment: Performed at [facility], [address], [place], [place] [address]    DG Chest Portable 1 View Result Date: 01/24/2024 CLINICAL DATA:  Marvell Slider twice today on steps. Follow-up small left pneumothorax with extensive left subcutaneous emphysema. Multiple left rib fractures. EXAM: PORTABLE CHEST 1 VIEW COMPARISON:  Portable chest and chest CT earlier today. FINDINGS: Multiple displaced left rib fractures are again demonstrated. Small left lateral pneumothorax with an interval increase in size. This currently occupies proximally 5-10% of the volume of the left hemithorax. No significant change in extensive subcutaneous emphysema on the left. Increased patchy density at the left lung base. Interval minimal linear atelectasis at the right lung base. Very poor depth of inspiration. Grossly stable mildly enlarged cardiac silhouette. IMPRESSION: 1. Interval increase in size of a small left lateral pneumothorax, currently occupying 5-10% of the volume of the left hemithorax. 2. No significant change in extensive subcutaneous emphysema on the left. 3. Increased patchy density at the left lung base, likely due to atelectasis. 4. Interval minimal linear atelectasis at the right lung base. 5. Grossly stable mild cardiomegaly. Electronically Signed   By: Catherin Closs M.D.   On: 01/24/2024 13:21   DG Chest Port 1 View Result Date:  01/24/2024 CLINICAL DATA:  Fall striking left side. EXAM: PORTABLE CHEST 1 VIEW COMPARISON:  PET-CT 07/23/2022 FINDINGS: Subcutaneous emphysema on the left with small left pneumothorax observed. Multiple rib fractures on the left to be detailed in the dedicated chest CT. Mild atelectasis at the lung bases. Apical lordotic projection. Mild enlargement of the cardiopericardial silhouette. IMPRESSION: 1. Small left pneumothorax with extensive left subcutaneous emphysema. 2. Multiple rib fractures on the left to be detailed in the dedicated chest CT. 3. Mild  enlargement of the cardiopericardial silhouette. 4. Mild atelectasis at the lung bases. Electronically Signed   By: Freida Jes M.D.   On: 01/24/2024 08:34   CT CHEST ABDOMEN PELVIS W CONTRAST Result Date: 01/24/2024 CLINICAL DATA:  Fall striking stairs with the left chest, shortness of breath EXAM: CT CHEST, ABDOMEN, AND PELVIS WITH CONTRAST TECHNIQUE: Multidetector CT imaging of the chest, abdomen and pelvis was performed following the standard protocol during bolus administration of intravenous contrast. RADIATION DOSE REDUCTION: This exam was performed according to the departmental dose-optimization program which includes automated exposure control, adjustment of the mA and/or kV according to patient size and/or use of iterative reconstruction technique. CONTRAST:  OMNIPAQUE  IOHEXOL  300 MG/ML  SOLN COMPARISON:  11/27/2023 and PET-CT from 07/23/2022 FINDINGS: CT CHEST FINDINGS Cardiovascular: Thoracic aortic atheromatous vascular calcification. Mild cardiomegaly. Mediastinum/Nodes: Trace pneumomediastinum extending in from the left chest and left neck region. Gas within mildly dilated thoracic esophagus. Lungs/Pleura: Left hydropneumothorax with pneumothorax about 10% of hemithoracic volume, and a small to moderate amount of free-flowing pleural fluid on the left with associated passive atelectasis. Stable right apical paraseptal bulla. Small left upper lobe pulmonary contusion. Mild atelectasis peripherally in the right middle lobe. Musculoskeletal: Segmental acute fractures with multiple displaced components involving the left third, fourth, fifth, and sixth ribs with nonsegmental lateral fractures of the left seventh, eighth, ninth, and tenth ribs. Extensive gas tracking along the left chest wall including the pectoralis musculature, left axilla and lateral breast, along the latissimus dorsi, and up into the left neck as well as tracking down through the thoracic inlet into the left upper  mediastinum. Old bilateral rib fractures are also noted and appear healed. Degenerative endplate sclerosis at T1-2. CT ABDOMEN PELVIS FINDINGS Hepatobiliary: Unremarkable Pancreas: Unremarkable Spleen: No visible splenic laceration or perisplenic ascites. Adrenals/Urinary Tract: Bosniak category 1 cyst of the right kidney lower pole warrants no further imaging workup. Otherwise unremarkable. Stomach/Bowel: Unremarkable Vascular/Lymphatic: Atherosclerosis is present, including aortoiliac atherosclerotic disease. Substantial atheromatous plaque noted proximally in the superior mesenteric artery as on image 114 series 6, potentially causing substantial stenosis but without overt occlusion. Reproductive: No obvious prostate asymmetry in this patient known prostate cancer. Other: No supplemental non-categorized findings. Musculoskeletal: Grade 1 degenerative anterolisthesis at L4-5 with degenerative endplate sclerosis and spurring at the L4-5 level. Fatty right spermatic cord. IMPRESSION: 1. Left hydropneumothorax with pneumothorax component about 10% of hemithoracic volume, and a small to moderate amount of free-flowing pleural fluid on the left with associated passive atelectasis. 2. Segmental acute fractures with multiple displaced components involving the left third, fourth, fifth, and sixth ribs with nonsegmental lateral fractures of the left seventh, eighth, ninth, and tenth ribs. Risk for flail chest. No definite sites of active bleeding identified. 3. Small left upper lobe pulmonary contusion. 4. Extensive gas tracking along the left chest wall including the pectoralis musculature, left axilla and lateral breast, along the latissimus dorsi, and up into the left neck as well as tracking down through the thoracic inlet into  the left upper mediastinum. 5. Trace pneumomediastinum extending in from the left chest and left neck region. 6. 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 7. Grade 1 degenerative anterolisthesis at L4-5 with degenerative endplate sclerosis and spurring at the L4-5 level. 8. Fatty right spermatic cord. 9.  Aortic Atherosclerosis (ICD10-I70.0). Critical Value/emergent results were called by telephone at the time of interpretation on 01/24/2024 at 8:25 am to provider Dr. Adan Holms, who verbally acknowledged these results. Electronically Signed   By: Freida Jes M.D.   On: 01/24/2024 08:26   CT Head Wo Contrast Result Date: 01/24/2024 CLINICAL DATA:  Blunt poly trauma EXAM: CT HEAD WITHOUT CONTRAST CT CERVICAL SPINE WITHOUT CONTRAST TECHNIQUE: Multidetector CT imaging of the head and cervical spine was performed following the standard protocol without intravenous contrast. Multiplanar CT image reconstructions of the cervical spine were also generated. RADIATION DOSE REDUCTION: This exam was performed according to the departmental dose-optimization program which includes automated exposure control, adjustment of the mA and/or kV according to patient size and/or use of iterative reconstruction technique. COMPARISON:  None Available. FINDINGS: CT HEAD FINDINGS Brain: No evidence of acute infarction, hemorrhage, hydrocephalus, extra-axial collection or mass lesion/mass effect. Brain atrophy that is generalized. Vascular: No hyperdense vessel or unexpected calcification. Skull: No acute fracture Sinuses/Orbits: No evidence of injury CT CERVICAL SPINE FINDINGS Alignment: Reversal of cervical lordosis considered degenerative. Mild C2-3 anterolisthesis Skull base and vertebrae: No acute fracture Soft tissues and spinal canal: No prevertebral fluid or swelling. No visible canal hematoma. Soft tissue emphysema arising from the chest based on dedicated CT imaging of the body. Disc levels: Advanced and generalized degeneration of disc spaces and facets with multilevel foraminal impingement. Upper chest:  Reported separately IMPRESSION: No evidence of acute intracranial or cervical spine injury. Soft tissue emphysema arising from left chest injury. Electronically Signed   By: Ronnette Coke M.D.   On: 01/24/2024 07:44   CT Cervical Spine Wo Contrast Result Date: 01/24/2024 CLINICAL DATA:  Blunt poly trauma EXAM: CT HEAD WITHOUT CONTRAST CT CERVICAL SPINE WITHOUT CONTRAST TECHNIQUE: Multidetector CT imaging of the head and cervical spine was performed following the standard protocol without intravenous contrast. Multiplanar CT image reconstructions of the cervical spine were also generated. RADIATION DOSE REDUCTION: This exam was performed according to the departmental dose-optimization program which includes automated exposure control, adjustment of the mA and/or kV according to patient size and/or use of iterative reconstruction technique. COMPARISON:  None Available. FINDINGS: CT HEAD FINDINGS Brain: No evidence of acute infarction, hemorrhage, hydrocephalus, extra-axial collection or mass lesion/mass effect. Brain atrophy that is generalized. Vascular: No hyperdense vessel or unexpected calcification. Skull: No acute fracture Sinuses/Orbits: No evidence of injury CT CERVICAL SPINE FINDINGS Alignment: Reversal of cervical lordosis considered degenerative. Mild C2-3 anterolisthesis Skull base and vertebrae: No acute fracture Soft tissues and spinal canal: No prevertebral fluid or swelling. No visible canal hematoma. Soft tissue emphysema arising from the chest based on dedicated CT imaging of the body. Disc levels: Advanced and generalized degeneration of disc spaces and facets with multilevel foraminal impingement. Upper chest: Reported separately IMPRESSION: No evidence of acute intracranial or cervical spine injury. Soft tissue emphysema arising from left chest injury. Electronically Signed   By: Ronnette Coke M.D.   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 On ADT w/ Zytiga/prednisone  daily. 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  Beatris Lincoln, MD [facility]

## 2024-01-24 NOTE — ED Provider Notes (Signed)
 [place] EMERGENCY DEPARTMENT AT [facility] Provider Note   CSN: [id number] Arrival date & time: 01/24/24  0551     History  Chief Complaint  Patient presents with   Fall        Shortness of Breath    Jason Rojas is a 63 y.o. male.  The history is provided by the patient.  Fall Associated symptoms include shortness of breath.  Shortness of Breath He has history of hypertension, COPD, prostate cancer and was brought in by ambulance after falling outside.  He states that he was going up his steps when he fell.  He tried to get up again and he fell again.  He is complaining of pain in the left side of his chest.  EMS noted he was hypoxic on room air and required 4 L/min oxygen to maintain minimally adequate oxygen saturation.  He does admit to drinking a lot of ethanol tonight.  He denies other injury.   Home Medications Prior to Admission medications   Medication Sig Start Date End Date Taking? Authorizing Provider  abiraterone acetate (ZYTIGA) 250 MG tablet Take 1,000 mg by mouth daily. Take on an empty stomach 1 hour before or 2 hours after a meal    [provider]  acetaminophen  (TYLENOL ) 325 MG tablet Take 650 mg by mouth every 6 (six) hours as needed for mild pain (pain score 1-3) or moderate pain (pain score 4-6).    [provider]  acetaminophen  (TYLENOL ) 500 MG tablet Take 1,000 mg by mouth every 6 (six) hours as needed for moderate pain (pain score 4-6) or mild pain (pain score 1-3).    [provider]  amLODipine  (NORVASC ) 5 MG tablet Take 5 mg by mouth daily.     [provider]  carvedilol  (COREG ) 25 MG tablet Take 12.5 mg by mouth 2 (two) times daily with a meal.    [provider]  clotrimazole  (LOTRIMIN ) 1 % cream Apply small amount to crease of your buttocks 2 times daily 07/24/23   Triplett, Tammy, PA-C  doxycycline  (VIBRAMYCIN ) 100 MG capsule Take 1 capsule (100 mg total) by mouth 2 (two) times daily.  11/27/23   Roselynn Connors, PA-C  gabapentin (NEURONTIN) 800 MG tablet Take 800 mg by mouth 3 (three) times daily as needed (for pain/nerves).     [provider]  hydrochlorothiazide (HYDRODIURIL) 25 MG tablet Take 25 mg by mouth daily.    [provider]  hydrocortisone  (ANUSOL -HC) 2.5 % rectal cream Place 1 Application rectally 2 (two) times daily. 12/08/23   Debbra Fairy, PA-C  lidocaine  (XYLOCAINE ) 5 % ointment Apply 1 Application topically as needed for moderate pain. 11/25/22   Trish Furl, MD  Lidocaine , Anorectal, (HEMORRHOIDAL RELIEF) 5 % CREA Apply 1 Application topically 5 (five) times daily as needed (HEMORRHOIDS). 08/20/23   Mordecai Applebaum, MD  mupirocin  ointment (BACTROBAN ) 2 % Apply topically 3 (three) times daily. 11/27/23   Roselynn Connors, PA-C  pramoxine-hydrocortisone  cream Apply topically 3 (three) times daily. 11/27/23   Roselynn Connors, PA-C  predniSONE  (DELTASONE ) 5 MG tablet Take 5 mg by mouth daily with breakfast.    [provider]  sennosides-docusate sodium  (SENOKOT-S) 8.6-50 MG tablet Take 1 tablet by mouth at bedtime.    [provider]  tamsulosin  (FLOMAX ) 0.4 MG CAPS capsule Take 1 capsule (0.4 mg total) by mouth daily after supper. 11/23/22   Kenith Payer, MD      Allergies  Tuberculin purified protein derivative    Review of Systems   Review of Systems  Respiratory:  Positive for shortness of breath.   All other systems reviewed and are negative.   Physical Exam Updated Vital Signs BP (!) 163/97   Pulse (!) 115   Temp 98.2 F (36.8 C) (Oral)   Resp (!) 21   Ht 5\' 7"  (1.702 m)   Wt 92 kg   SpO2 90%   BMI 31.77 kg/m  Physical Exam Vitals and nursing note reviewed.   63 year old male, appears uncomfortable in no acute respiratory distress. Vital signs are significant for elevated heart rate, respiratory rate, blood pressure. Oxygen saturation is 90%, which is normal, but only maintained with  supplemental oxygen. Head is normocephalic and atraumatic. PERRLA, EOMI. Oropharynx is clear. Neck is nontender. Back is nontender. Lungs are clear without rales, wheezes, or rhonchi. Chest is tender over the left lateral rib cage.  There is no crepitus. Heart has regular rate and rhythm without murmur. Abdomen is soft, flat, nontender. Extremities have full range of motion without swelling or deformity. Skin is warm and dry without rash. Neurologic: Awake and alert, cranial nerves are intact, moves all extremities equally.  ED Results / Procedures / Treatments   Labs (all labs ordered are listed, but only abnormal results are displayed) Labs Reviewed  COMPREHENSIVE METABOLIC PANEL WITH GFR - Abnormal; Notable for the following components:      Result Value   Sodium 122 (*)    Potassium 2.8 (*)    Chloride 83 (*)    Glucose, Bld 124 (*)    BUN 6 (*)    Creatinine, Ser 0.40 (*)    Total Protein 8.5 (*)    Total Bilirubin 1.4 (*)    All other components within normal limits  CBC WITH DIFFERENTIAL/PLATELET - Abnormal; Notable for the following components:   WBC 14.9 (*)    HCT 38.9 (*)    Neutro Abs 13.5 (*)    Lymphs Abs 0.5 (*)    Abs Immature Granulocytes 0.15 (*)    All other components within normal limits  ETHANOL  LIPASE, BLOOD  MAGNESIUM   TYPE AND SCREEN    EKG None  Radiology No results found.  Procedures Procedures  Cardiac monitor shows sinus tachycardia, per my interpretation.  Medications Ordered in ED Medications  potassium chloride  10 mEq in 100 mL IVPB (has no administration in time range)  sodium chloride  0.9 % bolus 1,000 mL (has no administration in time range)  iohexol  (OMNIPAQUE ) 300 MG/ML solution 100 mL (has no administration in time range)  morphine  (PF) 4 MG/ML injection 4 mg (4 mg Intravenous Given 01/24/24 0637)  ondansetron  (ZOFRAN ) injection 4 mg (4 mg Intravenous Given 01/24/24 0636)    ED Course/ Medical Decision Making/  A&P Clinical Course as of 01/24/24 1610  Fri Jan 24, 2024  [id number] Assumed care from Dr Candelaria Chaco. 63 yo M hx of COPD and prostate cancer who had a mechanical fall x 2 has been drinking. Was initially hypoxic on RA with chest pain. Is getting pan scanned with portable CXR.  [RP]    Clinical Course User Index [RP] Ninetta Basket, MD                                 Medical Decision Making Amount and/or Complexity of Data Reviewed Labs: ordered. Radiology: ordered.  Risk Prescription drug management.  Fall with injury to the left ribs and subsequent hypoxia.  In the setting of ethanol intoxication, I am concerned about other traumatic injuries as well.  I have ordered CT of head and cervical spine as well as CT of chest/abdomen/pelvis.  Prior to going to CT, he will get a portable chest x-ray to make sure there is no acute intrathoracic process which requires immediate intervention.  I have also ordered laboratory workup of CBC, comprehensive metabolic panel, ethanol.  I have reviewed the laboratory tests which have come back, my interpretation is severe hyponatremia, moderate to severe hypokalemia, elevated random glucose level, borderline elevated bilirubin of uncertain clinical significance, undetectable ethanol level, normal lipase, mild leukocytosis which is likely stress related, normal hemoglobin, normal platelet count.  X-rays and CT scans are pending.  I have ordered a magnesium level and I have ordered intravenous potassium as well as IV fluids.  On reviewing past records, he has had hyponatremia in the past, but not this severe.  Portable chest x-ray appears to show fractures of 3 ribs on the left with what appears to be subcutaneous air.  This is per my reading with radiologist interpretation pending.  CT scans are pending.  Case is signed out to Dr. Adan Holms.  CRITICAL CARE Performed by: Alissa April Total critical care time: 40 minutes Critical care time was exclusive of  separately billable procedures and treating other patients. Critical care was necessary to treat or prevent imminent or life-threatening deterioration. Critical care was time spent personally by me on the following activities: development of treatment plan with patient and/or surrogate as well as nursing, discussions with consultants, evaluation of patient's response to treatment, examination of patient, obtaining history from patient or surrogate, ordering and performing treatments and interventions, ordering and review of laboratory studies, ordering and review of radiographic studies, pulse oximetry and re-evaluation of patient's condition.  Final Clinical Impression(s) / ED Diagnoses Final diagnoses:  Fall in home, initial encounter  Hyponatremia  Hypokalemia  Blunt chest trauma, initial encounter    Rx / DC Orders ED Discharge Orders     None         Alissa April, MD 01/24/24 [phone]

## 2024-01-24 NOTE — ED Notes (Signed)
 Patient returned from CT

## 2024-01-24 NOTE — ED Provider Notes (Signed)
  Physical Exam  BP (!) 147/78   Pulse 89   Temp 98.6 F (37 C) (Oral)   Resp 16   Ht 5\' 7"  (1.702 m)   Wt 92 kg   SpO2 97%   BMI 31.77 kg/m   Physical Exam  Procedures  Procedures  ED Course / MDM   Clinical Course as of 01/25/24 0621  Fri Jan 24, 2024  1610 Assumed care from Dr Candelaria Chaco. 63 yo M hx of COPD and prostate cancer who had a mechanical fall x 2 has been drinking. Was initially hypoxic on RA with chest pain. Is getting pan scanned with portable CXR.  [RP]  [phone] CT shows segmental 3rd through 6th rib fractures.  Also shows 7 through 10th rib fractures as well.  No signs of flail chest on exam.  Has been treated for COPD and is now satting better on nasal cannula 4 L.  Dr Aniceto Barley from trauma consulted and will admit to the ICU.  [RP]    Clinical Course User Index [RP] Ninetta Basket, MD   Medical Decision Making Amount and/or Complexity of Data Reviewed Labs: ordered. Radiology: ordered.  Risk OTC drugs. Prescription drug management. Decision regarding hospitalization.      Ninetta Basket, MD 01/25/24 [phone]

## 2024-01-24 NOTE — Progress Notes (Signed)
 Jason Rojas admitted to 4N ICU at 1825. Dr. Camilo Cella notified of patients arrival at [facility] for order management. Jason Rojas's daughter called and updated on patient condition, as per patients request. All belongings (shirt, sweatpants, and underwear, wallet with $30 cash and one visa card at bedside. [name] oriented to room and call bell, safety maintained.

## 2024-01-25 ENCOUNTER — Inpatient Hospital Stay (HOSPITAL_COMMUNITY)

## 2024-01-25 LAB — BASIC METABOLIC PANEL WITH GFR
Anion gap: 11 (ref 5–15)
BUN: 6 mg/dL — ABNORMAL LOW (ref 8–23)
CO2: 28 mmol/L (ref 22–32)
Calcium: 9.5 mg/dL (ref 8.9–10.3)
Chloride: 92 mmol/L — ABNORMAL LOW (ref 98–111)
Creatinine, Ser: 0.5 mg/dL — ABNORMAL LOW (ref 0.61–1.24)
GFR, Estimated: >60 mL/min (ref >60)
Glucose, Bld: 134 mg/dL — ABNORMAL HIGH (ref 70–99)
Potassium: 3.9 mmol/L (ref 3.5–5.1)
Sodium: 131 mmol/L — ABNORMAL LOW (ref 135–145)

## 2024-01-25 LAB — CBC
HCT: 35.1 % — ABNORMAL LOW (ref 39.0–52.0)
Hemoglobin: 12.1 g/dL — ABNORMAL LOW (ref 13.0–17.0)
MCH: 27.9 pg (ref 26.0–34.0)
MCHC: 34.5 g/dL (ref 30.0–36.0)
MCV: 81.1 fL (ref 80.0–100.0)
Platelets: 196 10*3/uL (ref 150–400)
RBC: 4.33 MIL/uL (ref 4.22–5.81)
RDW: 13.5 % (ref 11.5–15.5)
WBC: 19.4 10*3/uL — ABNORMAL HIGH (ref 4.0–10.5)
nRBC: 0 % (ref 0.0–0.2)

## 2024-01-25 MED ORDER — AMLODIPINE BESYLATE 5 MG PO TABS
5.0000 mg | ORAL_TABLET | Freq: Every day | ORAL | Status: DC
  Administered 2024-01-25 – 2024-01-31 (×7): 5 mg via ORAL
  Filled 2024-01-25 (×7): qty 1

## 2024-01-25 MED ORDER — IPRATROPIUM-ALBUTEROL 0.5-2.5 (3) MG/3ML IN SOLN
3.0000 mL | Freq: Four times a day (QID) | RESPIRATORY_TRACT | Status: DC
  Administered 2024-01-25 – 2024-02-05 (×43): 3 mL via RESPIRATORY_TRACT
  Filled 2024-01-25 (×19): qty 3
  Filled 2024-01-25: qty 6
  Filled 2024-01-25 (×25): qty 3

## 2024-01-25 MED ORDER — LORAZEPAM 2 MG/ML IJ SOLN
1.0000 mg | INTRAMUSCULAR | Status: AC | PRN
  Administered 2024-01-25: 1 mg via INTRAVENOUS
  Administered 2024-01-25 – 2024-01-26 (×3): 2 mg via INTRAVENOUS
  Filled 2024-01-25 (×4): qty 1

## 2024-01-25 MED ORDER — LORAZEPAM 1 MG PO TABS
1.0000 mg | ORAL_TABLET | ORAL | Status: AC | PRN
  Administered 2024-01-26 (×2): 1 mg via ORAL
  Filled 2024-01-25 (×2): qty 1

## 2024-01-25 MED ORDER — SPIRITUS FRUMENTI
1.0000 | Freq: Three times a day (TID) | ORAL | Status: DC
  Administered 2024-01-25 – 2024-01-31 (×15): 1 via ORAL
  Filled 2024-01-25 (×22): qty 1

## 2024-01-25 NOTE — Evaluation (Signed)
 Physical Therapy Evaluation Patient Details Name: Jason Rojas MRN: [id number] DOB: 06-Aug-1961 Today's Date: 01/25/2024  History of Present Illness  Pt is a 63 y.o. male who presented 01/24/24 s/p x2 falls outside. ED workup significant for L Rib FX 3-10, L hemopneumothorax, and L pulmonary contusion. On CIWA protocol. PMH: COPD, HTN, PTSD, sleep apnea, EtOH abuse, and prostate CA   Clinical Impression  Pt presents with condition above and deficits mentioned below, see PT Problem List. PTA, he was mod I utilizing a SPC and living alone. However, he reports he is going to go stay at his mother's 1-level house with 1 STE upon d/c. He endorses a hx of falls and reports his mother also falls often. It is unclear how much assistance could be provided to him by other family members as the pt began to get annoyed with the multiple questions. Currently, he is primarily limited by pain and demonstrating deficits in memory, attention, and safety awareness. He is mostly pleasant but would intermittently snap at therapist and become agitated briefly for unclear reasons and pt would make comments that did not seem to coincide with the current events of the session. He required minA for bed mobility, transfers, and taking a few steps at EOB with UE support. Pt deferred further gait attempts due to pain this date. He is at risk for subsequent falls and injury at this time, displaying deficits in balance, activity tolerance, and gross functional strength. In the hopes of progressing pt to a mod I level, he could greatly benefit from intensive inpatient rehab, > 3 hours/day. However, if he progresses quickly, he may be able to d/c home with HHPT. Will continue to follow acutely.        If plan is discharge home, recommend the following: A little help with walking and/or transfers;A lot of help with bathing/dressing/bathroom;Assistance with cooking/housework;Direct supervision/assist for financial management;Direct  supervision/assist for medications management;Assist for transportation;Help with stairs or ramp for entrance;Supervision due to cognitive status   Can travel by private vehicle        Equipment Recommendations Rolling walker (2 wheels);BSC/3in1 (pending progress)  Recommendations for Other Services  Rehab consult    Functional Status Assessment Patient has had a recent decline in their functional status and demonstrates the ability to make significant improvements in function in a reasonable and predictable amount of time.     Precautions / Restrictions Precautions Precautions: Fall;Other (comment) Recall of Precautions/Restrictions: Impaired Precaution/Restrictions Comments: watch SpO2 Restrictions Weight Bearing Restrictions Per Provider Order: No      Mobility  Bed Mobility Overal bed mobility: Needs Assistance Bed Mobility: Rolling, Sidelying to Sit, Sit to Supine Rolling: Min assist Sidelying to sit: Min assist, HOB elevated   Sit to supine: Min assist, HOB elevated   General bed mobility comments: Pt needed repeated cues to hug pillow to his L side for pain management with bed mobility. Extra time and repeated cues needed to roll to R then sequence sidelying to sit R EOB. Pt had difficulty powering his trunk up to sit. MinA needed at trunk. MinA needed for pt to hold onto therapist's hand to control his trunk descent with return to supine and minA needed at legs to lift back up onto bed.    Transfers Overall transfer level: Needs assistance Equipment used: 1 person hand held assist Transfers: Sit to/from Stand Sit to Stand: Min assist           General transfer comment: MinA needed for balance when standing  from EOB x2 reps with UE support on therapist or bed rail. P impulsively sat after standing only briefly the first rep due to pain.    Ambulation/Gait Ambulation/Gait assistance: Min assist Gait Distance (Feet): 4 Feet Assistive device: 1 person hand held  assist Gait Pattern/deviations: Step-through pattern, Decreased stride length Gait velocity: reduced Gait velocity interpretation: <1.31 ft/sec, indicative of household ambulator Pre-gait activities: marching in place at EOB with UE support, minA for balance, >x8 reps bil General Gait Details: Pt takes slow, very small steps forward and backward at EOB with UE support on bed rail or therapist, minA needed for balance. Pt declined further gait attempts due to pain this date  Stairs            Wheelchair Mobility     Tilt Bed    Modified Rankin (Stroke Patients Only)       Balance Overall balance assessment: Needs assistance Sitting-balance support: Feet supported, Single extremity supported, No upper extremity supported Sitting balance-Leahy Scale: Fair Sitting balance - Comments: Able to sit EOB statically with CGA for safety, but pt mildly impulsive to lean trunk posteriorly or anteriorly   Standing balance support: Single extremity supported, Bilateral upper extremity supported, During functional activity Standing balance-Leahy Scale: Poor Standing balance comment: Reliant on UE support and minA                             Pertinent Vitals/Pain Pain Assessment Pain Assessment: Faces Faces Pain Scale: Hurts even more Pain Location: L ribs Pain Descriptors / Indicators: Discomfort, Grimacing, Guarding, Moaning Pain Intervention(s): Limited activity within patient's tolerance, Monitored during session, Repositioned, Premedicated before session    Home Living Family/patient expects to be discharged to:: Private residence Living Arrangements: Other relatives (mother) Available Help at Discharge: Family;Available PRN/intermittently (mother cannot assist much) Type of Home: House Home Access: Stairs to enter   [employer] of Steps: 1 (short)   Home Layout: One level Home Equipment: Cane - single point Additional Comments: pt typically lives  alone but plans to go stay with elderly mother, who also falls often, info above is for mother's house    Prior Function Prior Level of Function : Independent/Modified Independent             Mobility Comments: Uses SPC, reports hx of falls       Extremity/Trunk Assessment   Upper Extremity Assessment Upper Extremity Assessment: Defer to OT evaluation    Lower Extremity Assessment Lower Extremity Assessment: Generalized weakness (denied numbness/tingling bil; gross MMT scores of 4+ to 5 bil; weakness noted more functionally, seemingly more limited by pain)    Cervical / Trunk Assessment Cervical / Trunk Assessment: Normal  Communication   Communication Communication: No apparent difficulties    Cognition Arousal: Alert Behavior During Therapy: Impulsive (mildly agitated briefly intermittently, other times very pleasant)   PT - Cognitive impairments: Memory, Attention, Problem solving, Sequencing, Safety/Judgement                       PT - Cognition Comments: Pt very pleasant majority of session, but at times would snap at therapist, becoming briefly agitated, not seeming to understand therapist was trying to guard him to keep him safe at times. Pt has difficulty attending to cues and tasks, sometimes forgetting what he was doing or what the cues were. Pt even verbally expressing he is forgetting things. Impulsive to sit when experiencing pain. Following commands:  Impaired Following commands impaired: Follows one step commands with increased time     Cueing Cueing Techniques: Verbal cues, Tactile cues, Gestural cues     General Comments General comments (skin integrity, edema, etc.): SpO2 down to 80s% on RA when sitting EOB, [place] donned with sats improving to 90s% remainder of session    Exercises     Assessment/Plan    PT Assessment Patient needs continued PT services  PT Problem List Decreased strength;Decreased balance;Decreased activity  tolerance;Decreased mobility;Decreased cognition;Decreased coordination;Decreased safety awareness;Cardiopulmonary status limiting activity;Pain       PT Treatment Interventions DME instruction;Gait training;Stair training;Functional mobility training;Therapeutic activities;Therapeutic exercise;Balance training;Neuromuscular re-education;Patient/family education;Cognitive remediation    PT Goals (Current goals can be found in the Care Plan section)  Acute Rehab PT Goals Patient Stated Goal: to reduce pain PT Goal Formulation: With patient Time For Goal Achievement: 02/08/24 Potential to Achieve Goals: Good    Frequency Min 2X/week     Co-evaluation               AM-PAC PT "6 Clicks" Mobility  Outcome Measure Help needed turning from your back to your side while in a flat bed without using bedrails?: A Little Help needed moving from lying on your back to sitting on the side of a flat bed without using bedrails?: A Little Help needed moving to and from a bed to a chair (including a wheelchair)?: A Little Help needed standing up from a chair using your arms (e.g., wheelchair or bedside chair)?: A Little Help needed to walk in hospital room?: Total (<20 ft) Help needed climbing 3-5 steps with a railing? : Total 6 Click Score: 14    End of Session Equipment Utilized During Treatment: Gait belt;Oxygen Activity Tolerance: Patient limited by pain Patient left: in bed;with call bell/phone within reach;with bed alarm set Nurse Communication: Mobility status;Other (comment) (pt's mood/behavior; sats) PT Visit Diagnosis: Unsteadiness on feet (R26.81);Other abnormalities of gait and mobility (R26.89);Muscle weakness (generalized) (M62.81);Difficulty in walking, not elsewhere classified (R26.2);Pain Pain - Right/Left: Left Pain - part of body:  (ribs)    Time: 1610-9604 PT Time Calculation (min) (ACUTE ONLY): 22 min   Charges:   PT Evaluation $PT Eval Moderate Complexity: 1 Mod    PT General Charges $$ ACUTE PT VISIT: 1 Visit         Vernida Goodie, PT, DPT Acute Rehabilitation Services  Office: [phone]   Ellyn Hack 01/25/2024, 4:25 PM

## 2024-01-25 NOTE — Plan of Care (Signed)
  Problem: Clinical Measurements: Goal: Ability to maintain clinical measurements within normal limits will improve Outcome: Progressing Goal: Respiratory complications will improve Outcome: Progressing Goal: Cardiovascular complication will be avoided Outcome: Progressing   Problem: Coping: Goal: Level of anxiety will decrease Outcome: Progressing   Problem: Elimination: Goal: Will not experience complications related to urinary retention Outcome: Progressing   Problem: Pain Managment: Goal: General experience of comfort will improve and/or be controlled Outcome: Progressing

## 2024-01-25 NOTE — Progress Notes (Signed)
 Transition of Care [facility]) - CAGE-AID Screening   Patient Details  Name: Jason Rojas MRN: [id number] Date of Birth: [birth year]   Asa Bjork, RN Trauma Response Nurse Phone Number: [phone] 01/25/2024, 11:57 AM   Clinical Narrative:  Pt fell on 4/24 at home, presented to [facility] ED, stated he had been drinking and fell- on arrival pt's ETOH was negative.   CAGE-AID Screening:    Have You Ever Felt You Ought to Cut Down on Your Drinking or Drug Use?: No Have People Annoyed You By Critizing Your Drinking Or Drug Use?: No Have You Felt Bad Or Guilty About Your Drinking Or Drug Use?: Yes Have You Ever Had a Drink or Used Drugs First Thing In The Morning to Steady Your Nerves or to Get Rid of a Hangover?: No CAGE-AID Score: 1  Substance Abuse Education Offered: (S) No (declines information-- does drink "at least a 6 pk" a day- and "probably more" per pt. states he does not know if he has been through withdrawal from alcohol in the past.)

## 2024-01-25 NOTE — Progress Notes (Signed)
 Patient ID: Jason Rojas, male   DOB: 1960-10-27, 63 y.o.   MRN: [id number] Follow up - Trauma Critical Care   Patient Details:    Jason Rojas is an 63 y.o. male.  Lines/tubes : External Urinary Catheter (Active)  Securement Method None needed 01/25/24 0800  Site Assessment Clean, Dry, Intact 01/25/24 0800  Intervention No interventions needed at this time 01/25/24 0800  Output (mL) 75 mL 01/25/24 0648    Microbiology/Sepsis markers: Results for orders placed or performed during the hospital encounter of 01/24/24  MRSA Next Gen by PCR, Nasal     Status: None   Collection Time: 01/24/24  6:25 PM   Specimen: Nasal Mucosa; Nasal Swab  Result Value Ref Range Status   MRSA by PCR Next Gen NOT DETECTED NOT DETECTED Final    Comment: (NOTE) The GeneXpert MRSA Assay (FDA approved for NASAL specimens only), is one component of a comprehensive MRSA colonization surveillance program. It is not intended to diagnose MRSA infection nor to guide or monitor treatment for MRSA infections. Test performance is not FDA approved in patients less than 1 years old. Performed at [facility], [address], [place], [place] [address]     Anti-infectives:  Anti-infectives (From admission, onward)    Start     Dose/Rate Route Frequency Ordered Stop   01/24/24 0900  ceFAZolin  (ANCEF ) IVPB 2g/100 mL premix        2 g 200 mL/hr over 30 Minutes Intravenous STAT 01/24/24 0854 01/24/24 0941      Consults: Treatment Team:  Md, Trauma, MD    Studies:    Events:  Subjective:    Overnight Issues: stable, using IS  Objective:  Vital signs for last 24 hours: Temp:  [98 F (36.7 C)-98.6 F (37 C)] 98 F (36.7 C) (04/26 0800) Pulse Rate:  [78-109] 78 (04/26 0826) Resp:  [15-25] 21 (04/26 0826) BP: (137-204)/(78-128) 141/85 (04/26 0826) SpO2:  [90 %-99 %] 90 % (04/26 0826)  Hemodynamic parameters for last 24 hours:    Intake/Output from previous day: 04/25 0701 - 04/26  0700 In: 1980 [P.O.:780; IV Piggyback:1200] Out: 2025 [Urine:2025]  Intake/Output this shift: No intake/output data recorded.  Vent settings for last 24 hours:    Physical Exam:  General: no distress Neuro: alert and oriented HEENT/Neck: no JVD Resp: mild wheeze, chest wall tender CVS: RRR GI: soft, NT Extremities: calves soft  Results for orders placed or performed during the hospital encounter of 01/24/24 (from the past 24 hours)  MRSA Next Gen by PCR, Nasal     Status: None   Collection Time: 01/24/24  6:25 PM   Specimen: Nasal Mucosa; Nasal Swab  Result Value Ref Range   MRSA by PCR Next Gen NOT DETECTED NOT DETECTED  HIV Antibody (routine testing w rflx)     Status: None   Collection Time: 01/24/24  7:08 PM  Result Value Ref Range   HIV Screen 4th Generation wRfx Non Reactive Non Reactive  CBC     Status: Abnormal   Collection Time: 01/25/24  7:46 AM  Result Value Ref Range   WBC 19.4 (H) 4.0 - 10.5 K/uL   RBC 4.33 4.22 - 5.81 MIL/uL   Hemoglobin 12.1 (L) 13.0 - 17.0 g/dL   HCT 93.2 (L) 35.5 - 73.2 %   MCV 81.1 80.0 - 100.0 fL   MCH 27.9 26.0 - 34.0 pg   MCHC 34.5 30.0 - 36.0 g/dL   RDW 20.2 54.2 -  15.5 %   Platelets 196 150 - 400 K/uL   nRBC 0.0 0.0 - 0.2 %  Basic metabolic panel     Status: Abnormal   Collection Time: 01/25/24  7:46 AM  Result Value Ref Range   Sodium 131 (L) 135 - 145 mmol/L   Potassium 3.9 3.5 - 5.1 mmol/L   Chloride 92 (L) 98 - 111 mmol/L   CO2 28 22 - 32 mmol/L   Glucose, Bld 134 (H) 70 - 99 mg/dL   BUN 6 (L) 8 - 23 mg/dL   Creatinine, Ser 7.42 (L) 0.61 - 1.24 mg/dL   Calcium 9.5 8.9 - 59.5 mg/dL   GFR, Estimated >63 >87 mL/min   Anion gap 11 5 - 15    Assessment & Plan: Present on Admission:  Fall    LOS: 1 day   Additional comments:I reviewed the patient's new clinical lab test results. 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 On ADT w/ Zytiga/prednisone  daily. hemorrhoids   FEN - reg; CIWA - precidex given heavy use history and suspected alcohol withdrawal seizures in the past, spiritus frumenti VTE - SCD's, LMWH ID - None  Dispo- ICU Critical Care Total Time*: 34 Minutes  Jason Gander, MD, MPH, FACS Trauma & General Surgery Use [email or web address] to contact on call provider  01/25/2024  *Care during the described time interval was provided by me. I have reviewed this patient's available data, including medical history, events of note, physical examination and test results as part of my evaluation.

## 2024-01-25 NOTE — Progress Notes (Signed)
 Trauma Event Note    Rounded on pt- sitting up in bed, with O2 off, nasal cannula in the bed. O2 sats were in the 80s- THis TRN explained that he needed to wear the Oxygen to help him breath- also Incentive Spirometer placed in the bed and encouraged to use. Pt is cooperative and agreeable to all things suggested.   Last imported Vital Signs BP (!) 173/93   Pulse 88   Temp 98 F (36.7 C) (Oral)   Resp 19   Ht 5\' 7"  (1.702 m)   Wt 202 lb 13.2 oz (92 kg)   SpO2 91%   BMI 31.77 kg/m   Trending CBC Recent Labs    01/24/24 0629 01/25/24 0746  WBC 14.9* 19.4*  HGB 13.2 12.1*  HCT 38.9* 35.1*  PLT 203 196    Trending Coag's No results for input(s): "APTT", "INR" in the last 72 hours.  Trending BMET Recent Labs    01/24/24 0629 01/25/24 0746  NA 122* 131*  K 2.8* 3.9  CL 83* 92*  CO2 24 28  BUN 6* 6*  CREATININE 0.40* 0.50*  GLUCOSE 124* 134*      Jason Rojas  Trauma Response RN  Please call TRN at [phone] for further assistance.

## 2024-01-26 LAB — BASIC METABOLIC PANEL WITH GFR
Anion gap: 11 (ref 5–15)
BUN: 8 mg/dL (ref 8–23)
CO2: 28 mmol/L (ref 22–32)
Calcium: 9.3 mg/dL (ref 8.9–10.3)
Chloride: 91 mmol/L — ABNORMAL LOW (ref 98–111)
Creatinine, Ser: 0.54 mg/dL — ABNORMAL LOW (ref 0.61–1.24)
GFR, Estimated: >60 mL/min (ref >60)
Glucose, Bld: 113 mg/dL — ABNORMAL HIGH (ref 70–99)
Potassium: 3.9 mmol/L (ref 3.5–5.1)
Sodium: 130 mmol/L — ABNORMAL LOW (ref 135–145)

## 2024-01-26 LAB — CBC
HCT: 36.4 % — ABNORMAL LOW (ref 39.0–52.0)
Hemoglobin: 12.2 g/dL — ABNORMAL LOW (ref 13.0–17.0)
MCH: 27.8 pg (ref 26.0–34.0)
MCHC: 33.5 g/dL (ref 30.0–36.0)
MCV: 82.9 fL (ref 80.0–100.0)
Platelets: 222 10*3/uL (ref 150–400)
RBC: 4.39 MIL/uL (ref 4.22–5.81)
RDW: 13.9 % (ref 11.5–15.5)
WBC: 16.2 10*3/uL — ABNORMAL HIGH (ref 4.0–10.5)
nRBC: 0 % (ref 0.0–0.2)

## 2024-01-26 LAB — GLUCOSE, CAPILLARY: Glucose-Capillary: 110 mg/dL — ABNORMAL HIGH (ref 70–99)

## 2024-01-26 NOTE — Evaluation (Signed)
 Occupational Therapy Evaluation Patient Details Name: Jason Rojas MRN: [id number] DOB: 1961/05/29 Today's Date: 01/26/2024   History of Present Illness   Pt is a 63 y.o. male who presented 01/24/24 s/p x2 falls outside. ED workup significant for L Rib FX 3-10, L hemopneumothorax, and L pulmonary contusion. On CIWA protocol. PMH: COPD, HTN, PTSD, sleep apnea, EtOH abuse, and prostate CA     Clinical Impressions Pt currently with decreased sustained attention and garbled speech throughout when questions were asked.  Opened eyes to stimulation but did not maintain greater than approximately 10 seconds.  Followed one step commands with increased time related to washing face and transferring to EOB.  BP in sitting at 177/90 with HR increasing from 100 BPM up to 108 BPM.  Oxygen sats at 93% on 4Ls nasal cannula.  Pt unclear on PLOF but per chart he lived alone and plans to discharge to his mother's house.  Unsure how much assist she can provide.  Feel he will benefit from acute care OT at this time in order to progress ADL independence, so patient can reach a safe level for discharge.  Based on current assist level recommend intensive inpatient follow-up therapy, >3 hours/day post acute stay.        If plan is discharge home, recommend the following:   A little help with walking and/or transfers;A little help with bathing/dressing/bathroom;Help with stairs or ramp for entrance;Assist for transportation;Supervision due to cognitive status     Functional Status Assessment   Patient has had a recent decline in their functional status and demonstrates the ability to make significant improvements in function in a reasonable and predictable amount of time.     Equipment Recommendations   Other (comment) (TBD next visit)     Recommendations for Other Services   Rehab consult     Precautions/Restrictions   Precautions Precautions: Fall;Other (comment) Recall of  Precautions/Restrictions: Impaired Precaution/Restrictions Comments: watch SpO2 Restrictions Weight Bearing Restrictions Per Provider Order: No     Mobility Bed Mobility Overal bed mobility: Needs Assistance Bed Mobility: Supine to Sit, Sit to Supine     Supine to sit: Mod assist Sit to supine: Mod assist   General bed mobility comments: Assist for bringing LEs off bed and bringing trunk up to sitting.  Assist with lowering trunk and then bringing LEs back up to the bed.    Transfers Overall transfer level: Needs assistance Equipment used: 1 person hand held assist Transfers: Sit to/from Stand, Bed to chair/wheelchair/BSC Sit to Stand: Mod assist     Step pivot transfers: Max assist     General transfer comment: Mod assist for sit to stand with max assist to take steps up toward the top of the bed.  Left knee buckling noted with stepping.      Balance Overall balance assessment: Needs assistance Sitting-balance support: Feet supported, Single extremity supported, No upper extremity supported Sitting balance-Leahy Scale: Poor Sitting balance - Comments: Min assist for initial sitting balance   Standing balance support: Single extremity supported, Bilateral upper extremity supported, During functional activity Standing balance-Leahy Scale: Poor Standing balance comment: Pt needs UE support for standing balance.                           ADL either performed or assessed with clinical judgement   ADL Overall ADL's : Needs assistance/impaired Eating/Feeding: Minimal assistance;Bed level Eating/Feeding Details (indicate cue type and reason): simulated HOB elevated Grooming: Wash/dry face;Minimal  assistance;Bed level Grooming Details (indicate cue type and reason): simulated HOB elevated Upper Body Bathing: Minimal assistance;Bed level   Lower Body Bathing: Maximal assistance;Sit to/from stand   Upper Body Dressing : Moderate assistance;Sitting Upper Body  Dressing Details (indicate cue type and reason): simulated Lower Body Dressing: Maximal assistance;Sit to/from stand   Toilet Transfer: Maximal assistance Toilet Transfer Details (indicate cue type and reason): simulated stepping up the side of the bed Toileting- Clothing Manipulation and Hygiene: Sit to/from stand;Maximal assistance       Functional mobility during ADLs: Maximal assistance (for standing and stepping up the side of the bed) General ADL Comments: Pt in bed with eyes closed.  Kept them closed majority of the session but would open briefly to verbal instruction, but maintained less than 10 seconds.  Mod assist for supine to sit with HOB elevated and then once sitting, he was able to maintain static sitting at min guard.  Mod assist for sit to stand from the EOB with max assist to take one step up toward the top of the bed, with LLE buckling with first step.  BP in sitting at 177/90.  Oxygen sats at 96% on 4 Ls nasal cannula with HR at 100 at rest and elevating up to 108 with activity.     Vision   Additional Comments: Pt kept eyes closed majority of session, unable to accurately evaluate vision.     Perception Perception: Not tested       Praxis Praxis: Not tested       Pertinent Vitals/Pain Pain Assessment Pain Assessment: Faces Faces Pain Scale: Hurts little more Pain Location: L ribs Pain Descriptors / Indicators: Discomfort, Grimacing, Guarding, Moaning Pain Intervention(s): Limited activity within patient's tolerance     Extremity/Trunk Assessment Upper Extremity Assessment Upper Extremity Assessment: Generalized weakness;Difficult to assess due to impaired cognition (unable to accurately assess secondary to attention and cogntion.  Spontaneous movement noted in BUEs.  AAROM shoulder flexion WFLs for BUEs but decreased ability to lift against gravity to command with the LUE without assist.  Able to wash face with R.)   Lower Extremity Assessment Lower  Extremity Assessment: Defer to PT evaluation   Cervical / Trunk Assessment Cervical / Trunk Assessment: Normal   Communication Communication Communication: Impaired Factors Affecting Communication: Reduced clarity of speech   Cognition Arousal: Lethargic Behavior During Therapy: Flat affect Cognition: No family/caregiver present to determine baseline             OT - Cognition Comments: Pt with eyes closed majority of the session.  He was oriented to place initially but then forgot when asked again.  He was able to state "Sunday" but not able to state the month.  He was also able to report a fall with rib fx's.  Max instructional cueing for attempts to keep eyes open but still only briefly maintaining  less than 10 seconds.                 Following commands: Impaired Following commands impaired: Follows one step commands with increased time                Home Living Family/patient expects to be discharged to:: Private residence Living Arrangements: Other relatives (mother) Available Help at Discharge: Family;Available PRN/intermittently (mother cannot assist much) Type of Home: House Home Access: Stairs to enter Entrance Stairs-Number of Steps: 1 (short)   Home Layout: One level  Home Equipment: Jeananne Mighty - single point   Additional Comments: pt typically lives alone but plans to go stay with elderly mother, who also falls often, info above is for mother's house      Prior Functioning/Environment Prior Level of Function : Independent/Modified Independent             Mobility Comments: Uses SPC, reports hx of falls      OT Problem List: Decreased strength;Decreased activity tolerance;Impaired balance (sitting and/or standing);Decreased safety awareness;Decreased cognition;Pain;Decreased knowledge of use of DME or AE;Decreased coordination   OT Treatment/Interventions: Self-care/ADL training;Patient/family education;Balance  training;Therapeutic activities;Cognitive remediation/compensation;DME and/or AE instruction      OT Goals(Current goals can be found in the care plan section)   Acute Rehab OT Goals Patient Stated Goal: Pt did not state this session OT Goal Formulation: With patient Time For Goal Achievement: 02/09/24 Potential to Achieve Goals: Good   OT Frequency:  Min 2X/week       AM-PAC OT "6 Clicks" Daily Activity     Outcome Measure Help from another person eating meals?: A Little Help from another person taking care of personal grooming?: A Little Help from another person toileting, which includes using toliet, bedpan, or urinal?: A Lot Help from another person bathing (including washing, rinsing, drying)?: A Lot Help from another person to put on and taking off regular upper body clothing?: A Lot Help from another person to put on and taking off regular lower body clothing?: A Lot 6 Click Score: 14   End of Session Equipment Utilized During Treatment: Oxygen Nurse Communication: Mobility status  Activity Tolerance: Patient limited by lethargy Patient left: in bed;with call bell/phone within reach;with bed alarm set  OT Visit Diagnosis: Unsteadiness on feet (R26.81);Other abnormalities of gait and mobility (R26.89);Repeated falls (R29.6);Muscle weakness (generalized) (M62.81);Other symptoms and signs involving cognitive function                Time: 1610-9604 OT Time Calculation (min): 30 min Charges:  OT General Charges $OT Visit: 1 Visit OT Evaluation $OT Eval Moderate Complexity: 1 Mod OT Treatments $Self Care/Home Management : 8-22 mins  Ardena Becker, OTR/L Acute Rehabilitation Services  Office [phone] 01/26/2024

## 2024-01-26 NOTE — Plan of Care (Signed)
  Problem: Clinical Measurements: Goal: Ability to maintain clinical measurements within normal limits will improve Outcome: Not Progressing   Problem: Coping: Goal: Level of anxiety will decrease Outcome: Not Progressing   

## 2024-01-26 NOTE — Progress Notes (Signed)
 Patient ID: Jason Rojas, male   DOB: Nov 10, 1960, 63 y.o.   MRN: [id number] Follow up - Trauma Critical Care   Patient Details:    Jason Rojas is an 63 y.o. male.  Lines/tubes : External Urinary Catheter (Active)  Securement Method None needed 01/25/24 1953  Site Assessment Clean, Dry, Intact 01/25/24 1953  Intervention No interventions needed at this time 01/25/24 1953  Output (mL) 50 mL 01/26/24 0600    Microbiology/Sepsis markers: Results for orders placed or performed during the hospital encounter of 01/24/24  MRSA Next Gen by PCR, Nasal     Status: None   Collection Time: 01/24/24  6:25 PM   Specimen: Nasal Mucosa; Nasal Swab  Result Value Ref Range Status   MRSA by PCR Next Gen NOT DETECTED NOT DETECTED Final    Comment: (NOTE) The GeneXpert MRSA Assay (FDA approved for NASAL specimens only), is one component of a comprehensive MRSA colonization surveillance program. It is not intended to diagnose MRSA infection nor to guide or monitor treatment for MRSA infections. Test performance is not FDA approved in patients less than 27 years old. Performed at [facility], [address], [place], [place] [address]     Anti-infectives:  Anti-infectives (From admission, onward)    Start     Dose/Rate Route Frequency Ordered Stop   01/24/24 0900  ceFAZolin  (ANCEF ) IVPB 2g/100 mL premix        2 g 200 mL/hr over 30 Minutes Intravenous STAT 01/24/24 0854 01/24/24 0941      Subjective:    Overnight Issues:   Objective:  Vital signs for last 24 hours: Temp:  [98 F (36.7 C)-98.7 F (37.1 C)] 98.7 F (37.1 C) (04/27 0400) Pulse Rate:  [78-111] 95 (04/27 0700) Resp:  [16-36] 19 (04/27 0700) BP: (132-188)/(61-170) 148/80 (04/27 0700) SpO2:  [82 %-100 %] 98 % (04/27 0700)  Hemodynamic parameters for last 24 hours:    Intake/Output from previous day: 04/26 0701 - 04/27 0700 In: 120 [P.O.:120] Out: 200 [Urine:200]  Intake/Output this shift: No intake/output  data recorded.  Vent settings for last 24 hours:    Physical Exam:  General: alert Neuro: more confused, does F/C HEENT/Neck: no JVD Resp: some wheezing CVS: RRR GI: soft, NT, ND Extremities: calv ess oft  Results for orders placed or performed during the hospital encounter of 01/24/24 (from the past 24 hours)  CBC     Status: Abnormal   Collection Time: 01/25/24  7:46 AM  Result Value Ref Range   WBC 19.4 (H) 4.0 - 10.5 K/uL   RBC 4.33 4.22 - 5.81 MIL/uL   Hemoglobin 12.1 (L) 13.0 - 17.0 g/dL   HCT 62.9 (L) 52.8 - 41.3 %   MCV 81.1 80.0 - 100.0 fL   MCH 27.9 26.0 - 34.0 pg   MCHC 34.5 30.0 - 36.0 g/dL   RDW 24.4 01.0 - 27.2 %   Platelets 196 150 - 400 K/uL   nRBC 0.0 0.0 - 0.2 %  Basic metabolic panel     Status: Abnormal   Collection Time: 01/25/24  7:46 AM  Result Value Ref Range   Sodium 131 (L) 135 - 145 mmol/L   Potassium 3.9 3.5 - 5.1 mmol/L   Chloride 92 (L) 98 - 111 mmol/L   CO2 28 22 - 32 mmol/L   Glucose, Bld 134 (H) 70 - 99 mg/dL   BUN 6 (L) 8 - 23 mg/dL   Creatinine, Ser 5.36 (L) 0.61 -  1.24 mg/dL   Calcium 9.5 8.9 - 21.3 mg/dL   GFR, Estimated >08 >65 mL/min   Anion gap 11 5 - 15  CBC     Status: Abnormal   Collection Time: 01/26/24  5:09 AM  Result Value Ref Range   WBC 16.2 (H) 4.0 - 10.5 K/uL   RBC 4.39 4.22 - 5.81 MIL/uL   Hemoglobin 12.2 (L) 13.0 - 17.0 g/dL   HCT 78.4 (L) 69.6 - 29.5 %   MCV 82.9 80.0 - 100.0 fL   MCH 27.8 26.0 - 34.0 pg   MCHC 33.5 30.0 - 36.0 g/dL   RDW 28.4 13.2 - 44.0 %   Platelets 222 150 - 400 K/uL   nRBC 0.0 0.0 - 0.2 %  Basic metabolic panel with GFR     Status: Abnormal   Collection Time: 01/26/24  5:09 AM  Result Value Ref Range   Sodium 130 (L) 135 - 145 mmol/L   Potassium 3.9 3.5 - 5.1 mmol/L   Chloride 91 (L) 98 - 111 mmol/L   CO2 28 22 - 32 mmol/L   Glucose, Bld 113 (H) 70 - 99 mg/dL   BUN 8 8 - 23 mg/dL   Creatinine, Ser 1.02 (L) 0.61 - 1.24 mg/dL   Calcium 9.3 8.9 - 72.5 mg/dL   GFR, Estimated >36  >64 mL/min   Anion gap 11 5 - 15    Assessment & Plan: Present on Admission:  Fall    LOS: 2 days   Additional comments:I reviewed the patient's new clinical lab test results. 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 On ADT w/ Zytiga/prednisone  daily. hemorrhoids   FEN - reg; CIWA - precidex given heavy use history and suspected alcohol withdrawal seizures in the past, spiritus frumenti, Precedex starting and D/W RN VTE - SCD's, LMWH ID - None  Dispo- ICU Critical Care Total Time*: 34 Minutes  Dorena Gander, MD, MPH, FACS Trauma & General Surgery Use [email or web address] to contact on call provider  01/26/2024  *Care during the described time interval was provided by me. I have reviewed this patient's available data, including medical history, events of note, physical examination and test results as part of my evaluation.

## 2024-01-26 NOTE — Progress Notes (Signed)

## 2024-01-27 LAB — BASIC METABOLIC PANEL WITH GFR
Anion gap: 11 (ref 5–15)
BUN: 21 mg/dL (ref 8–23)
CO2: 24 mmol/L (ref 22–32)
Calcium: 8.8 mg/dL — ABNORMAL LOW (ref 8.9–10.3)
Chloride: 95 mmol/L — ABNORMAL LOW (ref 98–111)
Creatinine, Ser: 0.74 mg/dL (ref 0.61–1.24)
GFR, Estimated: >60 mL/min (ref >60)
Glucose, Bld: 98 mg/dL (ref 70–99)
Potassium: 3.9 mmol/L (ref 3.5–5.1)
Sodium: 130 mmol/L — ABNORMAL LOW (ref 135–145)

## 2024-01-27 LAB — CBC
HCT: 34.2 % — ABNORMAL LOW (ref 39.0–52.0)
Hemoglobin: 11.3 g/dL — ABNORMAL LOW (ref 13.0–17.0)
MCH: 27.8 pg (ref 26.0–34.0)
MCHC: 33 g/dL (ref 30.0–36.0)
MCV: 84.2 fL (ref 80.0–100.0)
Platelets: 196 10*3/uL (ref 150–400)
RBC: 4.06 MIL/uL — ABNORMAL LOW (ref 4.22–5.81)
RDW: 13.7 % (ref 11.5–15.5)
WBC: 10.6 10*3/uL — ABNORMAL HIGH (ref 4.0–10.5)
nRBC: 0 % (ref 0.0–0.2)

## 2024-01-27 MED ORDER — ENSURE ENLIVE PO LIQD
237.0000 mL | Freq: Two times a day (BID) | ORAL | Status: DC
  Administered 2024-01-27 – 2024-01-31 (×5): 237 mL via ORAL

## 2024-01-27 NOTE — Progress Notes (Signed)
 Inpatient Rehab Admissions Coordinator:   Met with patient at bedside. He states he does not want to do 3 hours of therapy, as he wants to be able to rest. PT/OT changing recs to SNF as this is a more appropriate discharge plan for him. Will sign off at this time.   Rehab Admissons Coordinator Toriano Aikey, [place], [place] [phone]

## 2024-01-27 NOTE — TOC Progression Note (Signed)
 Transition of Care [facility]) - Progression Note    Patient Details  Name: Jason Rojas MRN: [id number] Date of Birth: 06/18/1961  Transition of [facility]) CM/SW Contact  Jannine Meo, RN Phone Number: 01/27/2024, 3:57 PM  Clinical Narrative:   Patient from home for fall with rib fractures. CIR evaluated patient, patient unable to do 3 hour therapy days. Patient now with SNF recommendations.         Expected Discharge Plan and Services                                               Social Determinants of Health (SDOH) Interventions SDOH Screenings   Food Insecurity: No Food Insecurity (01/25/2024)  Housing: Low Risk  (01/25/2024)  Transportation Needs: No Transportation Needs (01/27/2024)  Utilities: Not At Risk (01/27/2024)  Tobacco Use: High Risk (01/24/2024)    Readmission Risk Interventions     No data to display

## 2024-01-27 NOTE — Progress Notes (Signed)
 Occupational Therapy Treatment Patient Details Name: Jason Rojas MRN: [id number] DOB: 10/11/1960 Today's Date: 01/27/2024   History of present illness Pt is a 63 y.o. male who presented 01/24/24 s/p x2 falls outside. ED workup significant for L Rib FX 3-10, L hemopneumothorax, and L pulmonary contusion. On CIWA protocol. PMH: COPD, HTN, PTSD, sleep apnea, EtOH abuse, and prostate CA   OT comments  Pt is progressing towards OT goals. Pt continues to be drowsy initially, but more verbally interactive today - even joking a little by the end of the session. Pt mod A +2 for bed mobility, min A for grooming tasks utilizing R hand sitting EOB, mod A +2 for transfer to the recliner with multimodal cues. At this time changing post-acute recommendations to rehab of <3 hours daily to maximize safety and independence in ADL and functional transfers. OT will continue to follow acutely.      If plan is discharge home, recommend the following:  A little help with walking and/or transfers;Help with stairs or ramp for entrance;Assist for transportation;Supervision due to cognitive status;A lot of help with bathing/dressing/bathroom   Equipment Recommendations  Other (comment) (defer to next venue)    Recommendations for Other Services      Precautions / Restrictions Precautions Precautions: Fall;Other (comment) Recall of Precautions/Restrictions: Impaired Precaution/Restrictions Comments: watch SpO2 Restrictions Weight Bearing Restrictions Per Provider Order: No       Mobility Bed Mobility Overal bed mobility: Needs Assistance Bed Mobility: Supine to Sit     Supine to sit: Mod assist, +2 for physical assistance, +2 for safety/equipment, HOB elevated (20 degrees)     General bed mobility comments: Assist for bringing LEs off bed and bringing trunk up to sitting.  Once upright Pt with initial R lateral lean, able to correct with time and cues. able to assist with scooting hips fwd to EOB with min  A from pad    Transfers Overall transfer level: Needs assistance Equipment used: 2 person hand held assist Transfers: Sit to/from Stand, Bed to chair/wheelchair/BSC Sit to Stand: Mod assist, +2 physical assistance, +2 safety/equipment     Step pivot transfers: Mod assist, +2 physical assistance, +2 safety/equipment     General transfer comment: transferring with L leg leading for pivot to recliner with initial buckling and trouble advancing L foot/leg. mod A +2 for weight shift as well as multimodal cues for sequencing.     Balance Overall balance assessment: Needs assistance Sitting-balance support: Feet supported, Single extremity supported, No upper extremity supported Sitting balance-Leahy Scale: Poor Sitting balance - Comments: Mod assist for initial sitting balance Postural control: Right lateral lean (initially) Standing balance support: Single extremity supported, Bilateral upper extremity supported, During functional activity Standing balance-Leahy Scale: Poor Standing balance comment: Pt needs UE support for standing balance.                           ADL either performed or assessed with clinical judgement   ADL Overall ADL's : Needs assistance/impaired     Grooming: Wash/dry face;Set up;Sitting Grooming Details (indicate cue type and reason): EOB with wash cloth, using R hand             Lower Body Dressing: Maximal assistance;Sit to/from stand Lower Body Dressing Details (indicate cue type and reason): donning socks, able to lift legs to assist Toilet Transfer: Moderate assistance;+2 for physical assistance;+2 for safety/equipment;Cueing for safety;Cueing for sequencing;Stand-pivot Toilet Transfer Details (indicate cue type and reason): simulate  through recliner transfer leading with L         Functional mobility during ADLs: Moderate assistance;+2 for physical assistance;+2 for safety/equipment;Cueing for sequencing;Cueing for safety (2 person  HHA)      Extremity/Trunk Assessment              Vision       Perception     Praxis     Communication Communication Communication: Impaired Factors Affecting Communication: Reduced clarity of speech   Cognition Arousal: Lethargic Behavior During Therapy: Flat affect Cognition: No family/caregiver present to determine baseline             OT - Cognition Comments: Pt required encouragement to participate with therapy, but once going eyes open more and verbally was very engaged, answering questions and even joking. Decreased safety awareness and problem solving/sequencing                 Following commands: Impaired Following commands impaired: Follows one step commands with increased time      Cueing   Cueing Techniques: Verbal cues, Tactile cues, Gestural cues  Exercises      Shoulder Instructions       General Comments SpO2 >90% on 4L throughout session    Pertinent Vitals/ Pain       Pain Assessment Pain Assessment: Faces Faces Pain Scale: Hurts little more Pain Location: L ribs Pain Descriptors / Indicators: Discomfort, Grimacing, Guarding, Moaning Pain Intervention(s): Limited activity within patient's tolerance, Monitored during session, Repositioned  Home Living                                          Prior Functioning/Environment              Frequency  Min 2X/week        Progress Toward Goals  OT Goals(current goals can now be found in the care plan section)  Progress towards OT goals: Progressing toward goals  Acute Rehab OT Goals Patient Stated Goal: get to rehab and get back to independent OT Goal Formulation: With patient Time For Goal Achievement: 02/09/24 Potential to Achieve Goals: Good ADL Goals Pt Will Perform Eating: Independently;sitting Pt Will Perform Grooming: with set-up;sitting (3 tasks EOB) Pt Will Perform Upper Body Bathing: with supervision;sitting (EOB unsupported) Pt Will  Perform Lower Body Bathing: with supervision;sit to/from stand Pt Will Transfer to Toilet: with supervision;bedside commode;ambulating Pt Will Perform Toileting - Clothing Manipulation and hygiene: with supervision;sit to/from stand Additional ADL Goal #1: Pt will demonstrate sustained attention to bathing/grooming tasks for 10 mins with no more than min instructional cueing. Additional ADL Goal #2: Pt will complete supine to sit with supervision in preparation for selfcare tasks sit to stand.  Plan      Co-evaluation    PT/OT/SLP Co-Evaluation/Treatment: Yes Reason for Co-Treatment: For patient/therapist safety;Necessary to address cognition/behavior during functional activity;To address functional/ADL transfers PT goals addressed during session: Mobility/safety with mobility;Balance;Strengthening/ROM OT goals addressed during session: ADL's and self-care;Strengthening/ROM      AM-PAC OT "6 Clicks" Daily Activity     Outcome Measure   Help from another person eating meals?: A Little Help from another person taking care of personal grooming?: A Little Help from another person toileting, which includes using toliet, bedpan, or urinal?: A Lot Help from another person bathing (including washing, rinsing, drying)?: A Lot Help from another person to put on and taking off  regular upper body clothing?: A Lot Help from another person to put on and taking off regular lower body clothing?: A Lot 6 Click Score: 14    End of Session Equipment Utilized During Treatment: Oxygen;Gait belt (4L)  OT Visit Diagnosis: Unsteadiness on feet (R26.81);Other abnormalities of gait and mobility (R26.89);Repeated falls (R29.6);Muscle weakness (generalized) (M62.81);Other symptoms and signs involving cognitive function   Activity Tolerance Patient limited by lethargy   Patient Left with call bell/phone within reach;in chair;with chair alarm set   Nurse Communication Mobility status;Precautions         Time: 4401-0272 OT Time Calculation (min): 25 min  Charges: OT General Charges $OT Visit: 1 Visit OT Treatments $Self Care/Home Management : 8-22 mins  [place]ales [place] OTR/L Acute Rehabilitation Services Office: [phone]   Ebony Goldstein [place] 01/27/2024, 1:11 PM

## 2024-01-27 NOTE — Progress Notes (Signed)
 Physical Therapy Treatment Patient Details Name: Jason Rojas MRN: [id number] DOB: 1961-07-19 Today's Date: 01/27/2024   History of Present Illness Pt is a 63 y.o. male who presented 01/24/24 s/p x2 falls outside. ED workup significant for L Rib FX 3-10, L hemopneumothorax, and L pulmonary contusion. On CIWA protocol. PMH: COPD, HTN, PTSD, sleep apnea, EtOH abuse, and prostate CA    PT Comments  The pt was eventually agreeable to session and was able to make progress with mobility and activity tolerance this session. He did benefit from assist of 2 to complete sit-stand transfers and stepping safely, but had less instances of L knee buckling at this time. The pt remains limited by pain, and continues to demo deficits in memory, awareness, and attention this session, needing repeated cues at times to safely manage mobility. Pt expressed desire for less intensive post-acute rehab, <3hours/day, so recommendations updated. He remains at great risk for falls and does not have the support needed at home to return home safely at this time.     If plan is discharge home, recommend the following: A lot of help with bathing/dressing/bathroom;Assistance with cooking/housework;Direct supervision/assist for financial management;Direct supervision/assist for medications management;Assist for transportation;Help with stairs or ramp for entrance;Supervision due to cognitive status;A lot of help with walking and/or transfers   Can travel by private vehicle     No  Equipment Recommendations  Rolling walker (2 wheels);BSC/3in1    Recommendations for Other Services       Precautions / Restrictions Precautions Precautions: Fall;Other (comment) Recall of Precautions/Restrictions: Impaired Precaution/Restrictions Comments: watch SpO2, on 4L this session Restrictions Weight Bearing Restrictions Per Provider Order: No     Mobility  Bed Mobility Overal bed mobility: Needs Assistance Bed Mobility: Supine to  Sit     Supine to sit: Mod assist, +2 for physical assistance, +2 for safety/equipment, HOB elevated (20 degrees)     General bed mobility comments: Assist for bringing LEs off bed and bringing trunk up to sitting.  Once upright Pt with initial R lateral lean, able to correct with time and cues. able to assist with scooting hips fwd to EOB with min A from pad    Transfers Overall transfer level: Needs assistance Equipment used: 2 person hand held assist Transfers: Sit to/from Stand, Bed to chair/wheelchair/BSC Sit to Stand: Mod assist, +2 physical assistance, +2 safety/equipment   Step pivot transfers: Mod assist, +2 physical assistance, +2 safety/equipment       General transfer comment: transferring with L leg leading for pivot to recliner with initial buckling and trouble advancing L foot/leg. mod A +2 for weight shift as well as multimodal cues for sequencing.    Ambulation/Gait Ambulation/Gait assistance: Mod assist, +2 safety/equipment Gait Distance (Feet): 2 Feet Assistive device: 2 person hand held assist Gait Pattern/deviations: Step-to pattern, Decreased step length - left, Decreased stance time - left, Decreased stride length, Knees buckling Gait velocity: reduced     General Gait Details: limited to 4-5 lateral steps with blocking L knee and pt advancing RLE without assistance. modA of 2 to maintain balance, declined forwards ambulation due to fatigue     Balance Overall balance assessment: Needs assistance Sitting-balance support: Feet supported, Single extremity supported, No upper extremity supported Sitting balance-Leahy Scale: Poor Sitting balance - Comments: Mod assist for initial sitting balance Postural control: Right lateral lean (initially) Standing balance support: Single extremity supported, Bilateral upper extremity supported, During functional activity Standing balance-Leahy Scale: Poor Standing balance comment: Pt needs UE support for  standing  balance.                            Communication Communication Communication: Impaired Factors Affecting Communication: Reduced clarity of speech  Cognition Arousal: Lethargic Behavior During Therapy: Flat affect   PT - Cognitive impairments: Memory, Attention, Problem solving, Sequencing, Safety/Judgement                       PT - Cognition Comments: pt following commands with increased time, needing significant encouragement to participate and once moivng cues for safety and technique. Following commands: Impaired Following commands impaired: Follows one step commands with increased time    Cueing Cueing Techniques: Verbal cues, Tactile cues, Gestural cues     General Comments General comments (skin integrity, edema, etc.): SpO2 >90% on 4L throughout session      Pertinent Vitals/Pain Pain Assessment Pain Assessment: Faces Faces Pain Scale: Hurts little more Pain Location: L ribs Pain Descriptors / Indicators: Discomfort, Grimacing, Guarding, Moaning Pain Intervention(s): Limited activity within patient's tolerance, Monitored during session, Repositioned     PT Goals (current goals can now be found in the care plan section) Acute Rehab PT Goals Patient Stated Goal: to reduce pain PT Goal Formulation: With patient Time For Goal Achievement: 02/08/24 Potential to Achieve Goals: Good Progress towards PT goals: Progressing toward goals    Frequency    Min 2X/week      PT Plan      Co-evaluation PT/OT/SLP Co-Evaluation/Treatment: Yes Reason for Co-Treatment: For patient/therapist safety;Necessary to address cognition/behavior during functional activity;To address functional/ADL transfers PT goals addressed during session: Mobility/safety with mobility;Balance;Strengthening/ROM OT goals addressed during session: ADL's and self-care;Strengthening/ROM      AM-PAC PT "6 Clicks" Mobility   Outcome Measure  Help needed turning from your back  to your side while in a flat bed without using bedrails?: A Little Help needed moving from lying on your back to sitting on the side of a flat bed without using bedrails?: A Lot Help needed moving to and from a bed to a chair (including a wheelchair)?: A Lot Help needed standing up from a chair using your arms (e.g., wheelchair or bedside chair)?: A Lot Help needed to walk in hospital room?: Total Help needed climbing 3-5 steps with a railing? : Total 6 Click Score: 11    End of Session Equipment Utilized During Treatment: Gait belt;Oxygen Activity Tolerance: Patient limited by pain Patient left: in chair;with call bell/phone within reach;with chair alarm set;with nursing/sitter in room Nurse Communication: Mobility status PT Visit Diagnosis: Unsteadiness on feet (R26.81);Other abnormalities of gait and mobility (R26.89);Muscle weakness (generalized) (M62.81);Difficulty in walking, not elsewhere classified (R26.2);Pain Pain - Right/Left: Left     Time: 8295-6213 PT Time Calculation (min) (ACUTE ONLY): 25 min  Charges:    $Therapeutic Exercise: 8-22 mins PT General Charges $$ ACUTE PT VISIT: 1 Visit                     Barnabas Booth, PT, DPT   Acute Rehabilitation Department Office [phone] Secure Chat Communication Preferred   Lona Rist 01/27/2024, 1:24 PM

## 2024-01-27 NOTE — Progress Notes (Signed)
 Patient ID: Jason Rojas, male   DOB: 29-Jul-1961, 63 y.o.   MRN: [id number] Follow up - Trauma Critical Care   Patient Details:    Jason Rojas is an 63 y.o. male.  Lines/tubes : External Urinary Catheter (Active)  Securement Method None needed 01/27/24 0800  Site Assessment Clean, Dry, Intact 01/27/24 0800  Intervention No interventions needed at this time 01/27/24 0800  Output (mL) 100 mL 01/27/24 0800    Microbiology/Sepsis markers: Results for orders placed or performed during the hospital encounter of 01/24/24  MRSA Next Gen by PCR, Nasal     Status: None   Collection Time: 01/24/24  6:25 PM   Specimen: Nasal Mucosa; Nasal Swab  Result Value Ref Range Status   MRSA by PCR Next Gen NOT DETECTED NOT DETECTED Final    Comment: (NOTE) The GeneXpert MRSA Assay (FDA approved for NASAL specimens only), is one component of a comprehensive MRSA colonization surveillance program. It is not intended to diagnose MRSA infection nor to guide or monitor treatment for MRSA infections. Test performance is not FDA approved in patients less than 64 years old. Performed at [facility], [address] [place], [place] [address]     Anti-infectives:  Anti-infectives (From admission, onward)    Start     Dose/Rate Route Frequency Ordered Stop   01/24/24 0900  ceFAZolin  (ANCEF ) IVPB 2g/100 mL premix        2 g 200 mL/hr over 30 Minutes Intravenous STAT 01/24/24 0854 01/24/24 0941      Consults: Treatment Team:  Md, Trauma, MD    Studies:    Events:  Subjective:    Overnight Issues:   Objective:  Vital signs for last 24 hours: Temp:  [97.8 F (36.6 C)-99 F (37.2 C)] 98.7 F (37.1 C) (04/28 0800) Pulse Rate:  [61-95] 76 (04/28 0800) Resp:  [14-33] 22 (04/28 0800) BP: (85-139)/(60-95) 129/80 (04/28 0800) SpO2:  [93 %-100 %] 98 % (04/28 0800)  Hemodynamic parameters for last 24 hours:    Intake/Output from previous day: 04/27 0701 - 04/28 0700 In: 189.1  [I.V.:189.1] Out: 300 [Urine:300]  Intake/Output this shift: Total I/O In: 10.7 [I.V.:10.7] Out: 100 [Urine:100]  Vent settings for last 24 hours:    Physical Exam:  General: finishing bath, calm Neuro: does F/C, talks, some agitation HEENT/Neck: no JVD Resp: some wheeze, tender L ribs CVS: RRR GI: soft, NT, ND Extremities: calves soft  Results for orders placed or performed during the hospital encounter of 01/24/24 (from the past 24 hours)  Glucose, capillary     Status: Abnormal   Collection Time: 01/26/24  1:24 PM  Result Value Ref Range   Glucose-Capillary 110 (H) 70 - 99 mg/dL  CBC     Status: Abnormal   Collection Time: 01/27/24  6:45 AM  Result Value Ref Range   WBC 10.6 (H) 4.0 - 10.5 K/uL   RBC 4.06 (L) 4.22 - 5.81 MIL/uL   Hemoglobin 11.3 (L) 13.0 - 17.0 g/dL   HCT 62.1 (L) 30.8 - 65.7 %   MCV 84.2 80.0 - 100.0 fL   MCH 27.8 26.0 - 34.0 pg   MCHC 33.0 30.0 - 36.0 g/dL   RDW 84.6 96.2 - 95.2 %   Platelets 196 150 - 400 K/uL   nRBC 0.0 0.0 - 0.2 %  Basic metabolic panel with GFR     Status: Abnormal   Collection Time: 01/27/24  6:45 AM  Result Value Ref Range  Sodium 130 (L) 135 - 145 mmol/L   Potassium 3.9 3.5 - 5.1 mmol/L   Chloride 95 (L) 98 - 111 mmol/L   CO2 24 22 - 32 mmol/L   Glucose, Bld 98 70 - 99 mg/dL   BUN 21 8 - 23 mg/dL   Creatinine, Ser 5.40 0.61 - 1.24 mg/dL   Calcium 8.8 (L) 8.9 - 10.3 mg/dL   GFR, Estimated >98 >11 mL/min   Anion gap 11 5 - 15    Assessment & Plan: Present on Admission:  Fall    LOS: 3 days   Additional comments:I reviewed the patient's new clinical lab test results. 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 On ADT w/ Zytiga/prednisone  daily. Hemorrhoids FEN - reg; CIWA - precidex given heavy use history and suspected alcohol withdrawal seizures in the past, spiritus frumenti VTE - SCD's, LMWH ID - None  Dispo - ICU Critical Care Total Time*: 45 Minutes  Dorena Gander, MD, MPH, FACS Trauma & General Surgery Use [email or web address] to contact on call provider  01/27/2024  *Care during the described time interval was provided by me. I have reviewed this patient's available data, including medical history, events of note, physical examination and test results as part of my evaluation.

## 2024-01-28 ENCOUNTER — Telehealth: Payer: Self-pay | Admitting: *Deleted

## 2024-01-28 ENCOUNTER — Inpatient Hospital Stay (HOSPITAL_COMMUNITY)

## 2024-01-28 LAB — CBC
HCT: 34.7 % — ABNORMAL LOW (ref 39.0–52.0)
Hemoglobin: 11.8 g/dL — ABNORMAL LOW (ref 13.0–17.0)
MCH: 27.8 pg (ref 26.0–34.0)
MCHC: 34 g/dL (ref 30.0–36.0)
MCV: 81.6 fL (ref 80.0–100.0)
Platelets: 216 10*3/uL (ref 150–400)
RBC: 4.25 MIL/uL (ref 4.22–5.81)
RDW: 13.6 % (ref 11.5–15.5)
WBC: 10.3 10*3/uL (ref 4.0–10.5)
nRBC: 0 % (ref 0.0–0.2)

## 2024-01-28 LAB — BASIC METABOLIC PANEL WITH GFR
Anion gap: 11 (ref 5–15)
BUN: 14 mg/dL (ref 8–23)
CO2: 25 mmol/L (ref 22–32)
Calcium: 8.9 mg/dL (ref 8.9–10.3)
Chloride: 95 mmol/L — ABNORMAL LOW (ref 98–111)
Creatinine, Ser: 0.52 mg/dL — ABNORMAL LOW (ref 0.61–1.24)
GFR, Estimated: >60 mL/min (ref >60)
Glucose, Bld: 113 mg/dL — ABNORMAL HIGH (ref 70–99)
Potassium: 4 mmol/L (ref 3.5–5.1)
Sodium: 131 mmol/L — ABNORMAL LOW (ref 135–145)

## 2024-01-28 LAB — GLUCOSE, CAPILLARY: Glucose-Capillary: 124 mg/dL — ABNORMAL HIGH (ref 70–99)

## 2024-01-28 MED ORDER — HYDROXYZINE HCL 25 MG PO TABS
25.0000 mg | ORAL_TABLET | Freq: Four times a day (QID) | ORAL | Status: AC | PRN
  Administered 2024-01-30: 25 mg via ORAL
  Filled 2024-01-28: qty 1

## 2024-01-28 MED ORDER — CHLORDIAZEPOXIDE HCL 25 MG PO CAPS: 25.0000 mg | ORAL_CAPSULE | Freq: Three times a day (TID) | ORAL | Status: DC

## 2024-01-28 MED ORDER — MAGNESIUM HYDROXIDE 400 MG/5ML PO SUSP
30.0000 mL | Freq: Once | ORAL | Status: AC
  Administered 2024-01-28: 30 mL via ORAL
  Filled 2024-01-28: qty 30

## 2024-01-28 MED ORDER — THIAMINE HCL 100 MG/ML IJ SOLN
100.0000 mg | Freq: Once | INTRAMUSCULAR | Status: DC
Start: 2024-01-28 — End: 2024-01-28

## 2024-01-28 MED ORDER — CHLORDIAZEPOXIDE HCL 25 MG PO CAPS: 25.0000 mg | ORAL_CAPSULE | ORAL | Status: DC

## 2024-01-28 MED ORDER — CHLORDIAZEPOXIDE HCL 25 MG PO CAPS: 25.0000 mg | ORAL_CAPSULE | Freq: Four times a day (QID) | ORAL | Status: DC

## 2024-01-28 MED ORDER — LOPERAMIDE HCL 2 MG PO CAPS: 2.0000 mg | ORAL_CAPSULE | ORAL | Status: AC | PRN

## 2024-01-28 MED ORDER — CHLORDIAZEPOXIDE HCL 25 MG PO CAPS
25.0000 mg | ORAL_CAPSULE | Freq: Four times a day (QID) | ORAL | Status: AC | PRN
  Administered 2024-01-28: 25 mg via ORAL
  Filled 2024-01-28: qty 1

## 2024-01-28 MED ORDER — SENNA 8.6 MG PO TABS
2.0000 | ORAL_TABLET | Freq: Once | ORAL | Status: AC
  Administered 2024-01-28: 17.2 mg via ORAL
  Filled 2024-01-28: qty 2

## 2024-01-28 MED ORDER — MAGNESIUM CITRATE PO SOLN
1.0000 | Freq: Once | ORAL | Status: AC
  Administered 2024-01-28: 1 via ORAL
  Filled 2024-01-28: qty 296

## 2024-01-28 MED ORDER — BISACODYL 5 MG PO TBEC
10.0000 mg | DELAYED_RELEASE_TABLET | Freq: Once | ORAL | Status: AC
  Administered 2024-01-28: 10 mg via ORAL
  Filled 2024-01-28: qty 2

## 2024-01-28 MED ORDER — ADULT MULTIVITAMIN W/MINERALS CH
1.0000 | ORAL_TABLET | Freq: Every day | ORAL | Status: DC
Start: 2024-01-28 — End: 2024-01-31
  Administered 2024-01-29 – 2024-01-31 (×3): 1 via ORAL
  Filled 2024-01-28 (×4): qty 1

## 2024-01-28 MED ORDER — CHLORDIAZEPOXIDE HCL 25 MG PO CAPS
25.0000 mg | ORAL_CAPSULE | Freq: Four times a day (QID) | ORAL | Status: DC
  Administered 2024-01-29: 25 mg via ORAL
  Filled 2024-01-28: qty 1

## 2024-01-28 MED ORDER — BISACODYL 10 MG RE SUPP
10.0000 mg | Freq: Once | RECTAL | Status: AC
  Administered 2024-01-28: 10 mg via RECTAL
  Filled 2024-01-28: qty 1

## 2024-01-28 MED ORDER — POLYETHYLENE GLYCOL 3350 17 G PO PACK
17.0000 g | PACK | Freq: Two times a day (BID) | ORAL | Status: DC
  Administered 2024-01-28 – 2024-01-31 (×6): 17 g via ORAL
  Filled 2024-01-28 (×8): qty 1

## 2024-01-28 MED ORDER — CHLORDIAZEPOXIDE HCL 25 MG PO CAPS: 25.0000 mg | ORAL_CAPSULE | Freq: Every day | ORAL | Status: DC

## 2024-01-28 MED ORDER — CHLORDIAZEPOXIDE HCL 25 MG PO CAPS
50.0000 mg | ORAL_CAPSULE | Freq: Four times a day (QID) | ORAL | Status: AC
  Administered 2024-01-28 (×4): 50 mg via ORAL
  Filled 2024-01-28 (×4): qty 2

## 2024-01-28 MED ORDER — CHLORDIAZEPOXIDE HCL 25 MG PO CAPS: 25.0000 mg | ORAL_CAPSULE | Freq: Two times a day (BID) | ORAL | Status: DC

## 2024-01-28 NOTE — Progress Notes (Signed)
 Physical Therapy Treatment Patient Details Name: Jason Rojas MRN: [id number] DOB: 09-Jan-1961 Today's Date: 01/28/2024   History of Present Illness Pt is a 63 y.o. male who presented 01/24/24 s/p x2 falls outside. ED workup significant for L Rib FX 3-10, L hemopneumothorax, and L pulmonary contusion. On CIWA protocol. PMH: COPD, HTN, PTSD, sleep apnea, EtOH abuse, and prostate CA    PT Comments  Pt continues to make progress with mobility despite reports of pain and fatigue with movement. He required frequent seated rest breaks, but was able to complete multiple standing bouts and lateral stepping with encouragement, minA and BUE support on RW. Pt without instance of knee buckling today, but is significantly limited by poor LE strength and power, and poor activity tolerance. Continue to recommend post-acute rehab <3hours/day.     If plan is discharge home, recommend the following: A lot of help with bathing/dressing/bathroom;Assistance with cooking/housework;Direct supervision/assist for financial management;Direct supervision/assist for medications management;Assist for transportation;Help with stairs or ramp for entrance;Supervision due to cognitive status;A lot of help with walking and/or transfers   Can travel by private vehicle     No  Equipment Recommendations  Rolling walker (2 wheels);BSC/3in1    Recommendations for Other Services       Precautions / Restrictions Precautions Precautions: Fall;Other (comment) Recall of Precautions/Restrictions: Impaired Precaution/Restrictions Comments: watch SpO2, on 4L this session Restrictions Weight Bearing Restrictions Per Provider Order: No     Mobility  Bed Mobility Overal bed mobility: Needs Assistance Bed Mobility: Supine to Sit, Sit to Supine     Supine to sit: Mod assist, HOB elevated Sit to supine: Min assist   General bed mobility comments: Assist for bringing LEs off bed and bringing trunk up to sitting.  less assist for  balance today, mostly CGA. pt able to complete scooting with minA. minA to reposition in bed    Transfers Overall transfer level: Needs assistance Equipment used: Rolling walker (2 wheels) Transfers: Sit to/from Stand Sit to Stand: Min assist           General transfer comment: min-modA initially, progressed to minA with reps. cues for hand placement. slow power up and dependent on BUE support on RW    Ambulation/Gait Ambulation/Gait assistance: Mod assist Gait Distance (Feet): 5 Feet Assistive device: Rolling walker (2 wheels) Gait Pattern/deviations: Step-to pattern, Decreased step length - left, Decreased stance time - left, Decreased stride length Gait velocity: reduced     General Gait Details: small lateral steps along bed  to head of bed and back to foot. seated rest x2   Stairs             Wheelchair Mobility     Tilt Bed    Modified Rankin (Stroke Patients Only)       Balance Overall balance assessment: Needs assistance Sitting-balance support: Feet supported, Single extremity supported, No upper extremity supported Sitting balance-Leahy Scale: Fair Sitting balance - Comments: CGA   Standing balance support: During functional activity, Bilateral upper extremity supported Standing balance-Leahy Scale: Poor Standing balance comment: Pt needs UE support for standing balance.                            Communication Communication Communication: Impaired Factors Affecting Communication: Reduced clarity of speech  Cognition Arousal: Alert Behavior During Therapy: Flat affect   PT - Cognitive impairments: Memory, Attention, Problem solving, Sequencing, Safety/Judgement  PT - Cognition Comments: pt did not remember therapist from day prior, then later in session stating he needed to work with therapy more and surprised that provider in the room was PT. fluctated between playful jokes and slight  agitation Following commands: Impaired Following commands impaired: Follows one step commands with increased time    Cueing Cueing Techniques: Verbal cues, Tactile cues, Gestural cues  Exercises      General Comments General comments (skin integrity, edema, etc.): VSS on RA      Pertinent Vitals/Pain Pain Assessment Pain Assessment: Faces Faces Pain Scale: Hurts little more Pain Location: L ribs Pain Descriptors / Indicators: Discomfort, Grimacing, Guarding, Moaning Pain Intervention(s): Limited activity within patient's tolerance, Monitored during session, Repositioned     PT Goals (current goals can now be found in the care plan section) Acute Rehab PT Goals Patient Stated Goal: to reduce pain PT Goal Formulation: With patient Time For Goal Achievement: 02/08/24 Potential to Achieve Goals: Good Progress towards PT goals: Progressing toward goals    Frequency    Min 2X/week       AM-PAC PT "6 Clicks" Mobility   Outcome Measure  Help needed turning from your back to your side while in a flat bed without using bedrails?: A Little Help needed moving from lying on your back to sitting on the side of a flat bed without using bedrails?: A Lot Help needed moving to and from a bed to a chair (including a wheelchair)?: A Lot Help needed standing up from a chair using your arms (e.g., wheelchair or bedside chair)?: A Lot Help needed to walk in hospital room?: Total Help needed climbing 3-5 steps with a railing? : Total 6 Click Score: 11    End of Session Equipment Utilized During Treatment: Gait belt;Oxygen Activity Tolerance: Patient limited by pain Patient left: with call bell/phone within reach;in bed;with bed alarm set Nurse Communication: Mobility status PT Visit Diagnosis: Unsteadiness on feet (R26.81);Other abnormalities of gait and mobility (R26.89);Muscle weakness (generalized) (M62.81);Difficulty in walking, not elsewhere classified (R26.2);Pain Pain -  Right/Left: Left Pain - part of body:  (ribs)     Time: 1610-9604 PT Time Calculation (min) (ACUTE ONLY): 35 min  Charges:    $Therapeutic Exercise: 8-22 mins $Therapeutic Activity: 8-22 mins PT General Charges $$ ACUTE PT VISIT: 1 Visit                     Barnabas Booth, PT, DPT   Acute Rehabilitation Department Office [phone] Secure Chat Communication Preferred   Lona Rist 01/28/2024, 4:00 PM

## 2024-01-28 NOTE — Telephone Encounter (Signed)
 Message sent to endo to cancel procedure.  ----- Message -----  From: Jason Prairie, RN  Sent: 01/27/2024  11:19 AM EDT  To: [name] morning   This patient is in ICU at [facility] - I don't think he will make it for this procedure .   Thanks

## 2024-01-28 NOTE — TOC Initial Note (Addendum)
 Transition of Care [facility]) - Initial/Assessment Note    Patient Details  Name: Jason Rojas MRN: [id number] Date of Birth: [birth year]  Transition of Care [facility]) CM/SW Contact:    Dallman Baine E Daryus Sowash, LCSW Phone Number: 01/28/2024, 12:05 PM  Clinical Narrative:                 Met with patient at bedside. Patient somewhat confused but able to participate in conversation.  Patient reported he has been living with his Mother, however states she is 38 and "I'm too much for her." Explained SNF rec for short term rehab - patient is agreeable. Patient states he thinks he is 40% active with the [place] - CSW will reach out to [employer] [place] to see if patient could be approved for SNF STR. Informed patient that it would not be a local SNF as the [place] contracted SNFs are not local, patient verbalized understanding.   2:05Sherel Dikes obtained: [phone] A  Expected Discharge Plan: Skilled Nursing Facility Barriers to Discharge: Continued Medical Work up   Patient Goals and CMS Choice Patient states their goals for this hospitalization and ongoing recovery are:: agreeable to STR CMS Medicare.gov Compare Post Acute Care list provided to:: Patient Choice offered to / list presented to : Patient      Expected Discharge Plan and Services       Living arrangements for the past 2 months: Single Family Home                                      Prior Living Arrangements/Services Living arrangements for the past 2 months: Single Family Home Lives with:: Self Patient language and need for interpreter reviewed:: Yes Do you feel safe going back to the place where you live?: Yes      Need for Family Participation in Patient Care: Yes (Comment) Care giver support system in place?: Yes (comment)   Criminal Activity/Legal Involvement Pertinent to Current Situation/Hospitalization: No - Comment as needed  Activities of Daily Living   ADL Screening (condition at time of admission) Independently performs  ADLs?: Yes (appropriate for developmental age) Is the patient deaf or have difficulty hearing?: No Does the patient have difficulty seeing, even when wearing glasses/contacts?: No Does the patient have difficulty concentrating, remembering, or making decisions?: No  Permission Sought/Granted Permission sought to share information with : Oceanographer granted to share information with : Yes, Verbal Permission Granted     Permission granted to share info w AGENCY: [facility], SNFs        Emotional Assessment       Orientation: : Oriented to Self, Oriented to Place, Oriented to  Time, Fluctuating Orientation (Suspected and/or reported Sundowners) Alcohol / Substance Use: Not Applicable Psych Involvement: No (comment)  Admission diagnosis:  Hypokalemia [E87.6] Hyponatremia [E87.1] Fall [W19.XXXA] Blunt chest trauma, initial encounter [S29.8XXA] Fall in home, initial encounter [W19.Benny Braver, Y92.009] Patient Active Problem List   Diagnosis Date Noted   Fall 01/24/2024   Rectal pain 10/14/2023   Neoplasm of prostate, 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 [facility] [address] [place] [address] Phone: [phone] Fax: [phone]  [facility]  [facility]ARM[facility]Y - [place], [place] - [address] [place] [address] Phone: [phone] Fax: [phone]  [id number] - [place], [place] - [address] AT [facility] [address] [place] [address] Phone: [phone] Fax: [phone]  [place] [address] [place] [address] Phone: [phone] Fax: [phone]     Social  Drivers of Health (SDOH) Social History: SDOH Screenings   Food Insecurity: No Food Insecurity (01/25/2024)  Housing: Low Risk  (01/25/2024)  Transportation Needs: No Transportation Needs (01/27/2024)  Utilities: Not At Risk (01/27/2024)  Tobacco Use: High Risk (01/24/2024)   SDOH Interventions:     Readmission Risk Interventions    01/28/2024   12:04 PM  Readmission Risk Prevention Plan  Transportation Screening Complete  PCP or Specialist Appt within 3-5 Days Complete  HRI or Home Care Consult Complete  Social Work Consult for Recovery Care Planning/Counseling Complete  Palliative Care Screening Not Applicable  Medication Review Oceanographer) Complete

## 2024-01-29 MED ORDER — CHLORDIAZEPOXIDE HCL 25 MG PO CAPS
50.0000 mg | ORAL_CAPSULE | Freq: Four times a day (QID) | ORAL | Status: DC
  Administered 2024-01-29 – 2024-01-31 (×9): 50 mg via ORAL
  Filled 2024-01-29 (×10): qty 2

## 2024-01-29 NOTE — TOC Progression Note (Addendum)
 Transition of Care [facility]) - Progression Note    Patient Details  Name: Jason Rojas MRN: [id number] Date of Birth: April 05, 1961  Transition of Care [facility]) CM/SW Contact  Jamileth Putzier E Tae Robak, LCSW Phone Number: 01/29/2024, 3:10 PM  Clinical Narrative:    Call from [place] with [facility]. Patient has been approved for 32 days of STR at SNF (beginning on admit date to SNF). Approval expires 5/30 - if patient has not admitted to SNF by then, auth will need to be restarted. [facility] sent list of contracted [facility] SNFs to reach out to.  [facility]: left message for Admissions -[facility]: left message for Admissions [facility]: unable to reach -[facility]: spoke with Tiffany in Admissions, she does have VA beds, sent referral for her to review. -[facility]: left message for [facility] in Admissions -[facility]: referral sent to Ryann in Admissions - she reviewed referral and states they are unable to accept due to history of alcohol use. -[facility]: left message for Maggie in Admissions -[facility]: sent referral in Epic HUB -[facility]: left message for Admissions   Expected Discharge Plan: Skilled Nursing Facility Barriers to Discharge: Continued Medical Work up  Expected Discharge Plan and Services       Living arrangements for the past 2 months: Single Family Home                                       Social Determinants of Health (SDOH) Interventions SDOH Screenings   Food Insecurity: No Food Insecurity (01/25/2024)  Housing: Low Risk  (01/25/2024)  Transportation Needs: No Transportation Needs (01/27/2024)  Utilities: Not At Risk (01/27/2024)  Tobacco Use: High Risk (01/24/2024)    Readmission Risk Interventions    01/28/2024   12:04 PM  Readmission Risk Prevention Plan  Transportation Screening Complete  PCP or Specialist Appt within 3-5 Days Complete  HRI or Home Care Consult Complete   Social Work Consult for Recovery Care Planning/Counseling Complete  Palliative Care Screening Not Applicable  Medication Review Oceanographer) Complete

## 2024-01-29 NOTE — Progress Notes (Signed)
 Patient ID: Jason Rojas, male   DOB: March 13, 1961, 63 y.o.   MRN: [id number] Follow up - Trauma Critical Care   Patient Details:    Jason Rojas is an 63 y.o. male.  Lines/tubes : External Urinary Catheter (Active)  Securement Method None needed 01/29/24 0800  Site Assessment Clean, Dry, Intact 01/29/24 0800  Intervention No interventions needed at this time 01/29/24 0800  Output (mL) 350 mL 01/28/24 0600    Microbiology/Sepsis markers: Results for orders placed or performed during the hospital encounter of 01/24/24  MRSA Next Gen by PCR, Nasal     Status: None   Collection Time: 01/24/24  6:25 PM   Specimen: Nasal Mucosa; Nasal Swab  Result Value Ref Range Status   MRSA by PCR Next Gen NOT DETECTED NOT DETECTED Final    Comment: (NOTE) The GeneXpert MRSA Assay (FDA approved for NASAL specimens only), is one component of a comprehensive MRSA colonization surveillance program. It is not intended to diagnose MRSA infection nor to guide or monitor treatment for MRSA infections. Test performance is not FDA approved in patients less than 70 years old. Performed at [facility], [address] [place], [place] [address]     Anti-infectives:  Anti-infectives (From admission, onward)    Start     Dose/Rate Route Frequency Ordered Stop   01/24/24 0900  ceFAZolin  (ANCEF ) IVPB 2g/100 mL premix        2 g 200 mL/hr over 30 Minutes Intravenous STAT 01/24/24 0854 01/24/24 0941      Consults: Treatment Team:  Md, Trauma, MD    Studies:    Events:  Subjective:    Overnight Issues: dex  Objective:  Vital signs for last 24 hours: Temp:  [98.1 F (36.7 C)-98.9 F (37.2 C)] 98.3 F (36.8 C) (04/30 0800) Pulse Rate:  [54-88] 65 (04/30 0911) Resp:  [18-35] 25 (04/30 0911) BP: (94-156)/(60-92) 128/62 (04/30 0900) SpO2:  [90 %-100 %] 98 % (04/30 0911)  Hemodynamic parameters for last 24 hours:    Intake/Output from previous day: 04/29 0701 - 04/30 0700 In: 381  [I.V.:381] Out: -   Intake/Output this shift: Total I/O In: 19.6 [I.V.:19.6] Out: -   Vent settings for last 24 hours:    Physical Exam:  General: no respiratory distress Neuro: sedated HEENT/Neck: no JVD Resp: tender L chest, less wheezing CVS: RRR GI: soft, NT Extremities: calves soft  Results for orders placed or performed during the hospital encounter of 01/24/24 (from the past 24 hours)  Glucose, capillary     Status: Abnormal   Collection Time: 01/28/24  7:23 PM  Result Value Ref Range   Glucose-Capillary 124 (H) 70 - 99 mg/dL    Assessment & Plan: Present on Admission:  Fall    LOS: 5 days   Additional comments:I reviewed the patient's new clinical lab test results. / 63 y/o M s/p Fall on stairs L Rib FX 3-10 - multimodal pain control, IS/Pulm toilet L hemopneumothorax - CXR - 5-10% ptx on left, supplemental O2 via nasal cannunla; repeat film 4/26 PTX resolved, multimodal pain control L pulmonary contusion COPD/acute hypoxic respiratory failure - PRN albuterol  nebs, 4L, seems to be stabilizing HTN - home norvasc  ordered, also takes coreg  Hx Prostate CA - [id number]; bone met on R 10th rib; completed radiation therapy. On ADT w/ Zytiga/prednisone  daily. 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 I have reviewed this patient's available data, including medical history, events of note, physical examination and test results as part of my evaluation.

## 2024-01-29 NOTE — Progress Notes (Signed)
 Trauma/Critical Care Follow Up Note  Subjective:    Overnight Issues:   Objective:  Vital signs for last 24 hours: Temp:  [97.9 F (36.6 C)-98.9 F (37.2 C)] 98.1 F (36.7 C) (04/30 0000) Pulse Rate:  [71-103] 88 (04/29 2300) Resp:  [20-35] 23 (04/29 2300) BP: (107-156)/(61-92) 143/72 (04/29 2300) SpO2:  [89 %-100 %] 93 % (04/29 2300)  Hemodynamic parameters for last 24 hours:    Intake/Output from previous day: 04/29 0701 - 04/30 0700 In: 86.4 [I.V.:86.4] Out: -   Intake/Output this shift: No intake/output data recorded.  Vent settings for last 24 hours:    Physical Exam:  Gen: comfortable, no distress Neuro: follows commands, alert, communicative HEENT: PERRL Neck: supple CV: RRR Pulm: unlabored breathing on Cataract Abd: soft, NT   , no recent BM GU: urine clear and yellow, +spontaneous voids Extr: wwp, no edema  Results for orders placed or performed during the hospital encounter of 01/24/24 (from the past 24 hours)  CBC     Status: Abnormal   Collection Time: 01/28/24  5:57 AM  Result Value Ref Range   WBC 10.3 4.0 - 10.5 K/uL   RBC 4.25 4.22 - 5.81 MIL/uL   Hemoglobin 11.8 (L) 13.0 - 17.0 g/dL   HCT 84.6 (L) 96.2 - 95.2 %   MCV 81.6 80.0 - 100.0 fL   MCH 27.8 26.0 - 34.0 pg   MCHC 34.0 30.0 - 36.0 g/dL   RDW 84.1 32.4 - 40.1 %   Platelets 216 150 - 400 K/uL   nRBC 0.0 0.0 - 0.2 %  Basic metabolic panel with GFR     Status: Abnormal   Collection Time: 01/28/24  5:57 AM  Result Value Ref Range   Sodium 131 (L) 135 - 145 mmol/L   Potassium 4.0 3.5 - 5.1 mmol/L   Chloride 95 (L) 98 - 111 mmol/L   CO2 25 22 - 32 mmol/L   Glucose, Bld 113 (H) 70 - 99 mg/dL   BUN 14 8 - 23 mg/dL   Creatinine, Ser 0.27 (L) 0.61 - 1.24 mg/dL   Calcium 8.9 8.9 - 25.3 mg/dL   GFR, Estimated >66 >44 mL/min   Anion gap 11 5 - 15  Glucose, capillary     Status: Abnormal   Collection Time: 01/28/24  7:23 PM  Result Value Ref Range   Glucose-Capillary 124 (H) 70 - 99 mg/dL     Assessment & Plan: The plan of care was discussed with the bedside nurse for the day, Glenna, who is in agreement with this plan and no additional concerns were raised.   Present on Admission:  Fall    LOS: 5 days   Additional comments:I reviewed the patient's new clinical lab test results.   and I reviewed the patients new imaging test results.    63 y/o M s/p Fall on stairs L Rib FX 3-10 - multimodal pain control, IS/Pulm toilet L hemopneumothorax - CXR - 5-10% ptx on left, supplemental O2 via nasal cannunla; repeat film 4/26 PTX resolved, multimodal pain control L pulmonary contusion COPD/acute hypoxic respiratory failure - PRN albuterol  nebs, 4L, seems to be stabilizing HTN - home norvasc  ordered, also takes coreg  Hx Prostate CA - [id number]; bone met on R 10th rib; completed radiation therapy. On ADT w/ Zytiga/prednisone  daily. 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 I have reviewed this patient's available data, including medical history, events of note, physical examination and test results as part of my evaluation.

## 2024-01-29 NOTE — NC FL2 (Signed)
 [place]  MEDICAID FL2 LEVEL OF CARE FORM     IDENTIFICATION  Patient Name: Jason Rojas Birthdate: 1961/04/04 Sex: male Admission Date (Current Location): 01/24/2024  [facility] and [place] Number:  Chiropodist and Address:  [facility] [place]. [facility], 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 (HCC) 10/17/2022   Malignant neoplasm of prostate (HCC) 09/04/2022   History of colonic polyps 11/24/2018   Loss of weight 11/24/2018    Orientation RESPIRATION BLADDER Height & Weight     Self, Time, Situation, Place  O2 (4l) Incontinent, External catheter Weight: 202 lb 13.2 oz (92 kg) Height:  5\' 7"  (170.2 cm)  BEHAVIORAL SYMPTOMS/MOOD NEUROLOGICAL BOWEL NUTRITION STATUS      Incontinent Diet (reg)  AMBULATORY STATUS COMMUNICATION OF NEEDS Skin   Limited Assist Verbally Skin abrasions, Bruising                       Personal Care Assistance Level of Assistance  Bathing, Feeding, Dressing Bathing Assistance: Limited assistance Feeding assistance: Limited assistance Dressing Assistance: Limited assistance     Functional Limitations Info             SPECIAL CARE FACTORS FREQUENCY  PT (By licensed PT), OT (By licensed OT)     PT Frequency: 5 times per week OT Frequency: 5 times per week            Contractures      Additional Factors Info  Code Status, Allergies Code Status Info:  full Allergies Info: Tuberculin Purified Protein Derivative           Current Medications (01/29/2024):  This is the current hospital active medication list Current Facility-Administered Medications  Medication Dose Route Frequency Provider Last Rate Last Admin   acetaminophen  (TYLENOL ) tablet 1,000 mg  1,000 mg Oral Q6H Melvenia Stabs, MD   1,000 mg at 01/29/24 8469   amLODipine  (NORVASC ) tablet 5 mg  5 mg Oral Daily Dorena Gander, MD   5 mg at 01/29/24 1007   chlordiazePOXIDE (LIBRIUM) capsule 25 mg  25 mg Oral Q6H PRN Lovick, Ayesha N, MD   25 mg at 01/28/24 2025   chlordiazePOXIDE (LIBRIUM) capsule 50 mg  50 mg Oral QID Dorena Gander, MD   50 mg at 01/29/24 1437   Chlorhexidine  Gluconate Cloth 2 % PADS 6 each  6 each Topical Daily Dorena Gander, MD   6 each at 01/29/24 1122   dexmedetomidine (PRECEDEX) 400 MCG/100ML (4 mcg/mL) infusion  0-1.2 mcg/kg/hr Intravenous Titrated Melvenia Stabs, MD 13.8 mL/hr at 01/29/24 1337 0.6 mcg/kg/hr at 01/29/24 1337   docusate sodium  (COLACE) capsule 100 mg  100 mg Oral BID Melvenia Stabs, MD   100 mg at 01/28/24 2112   enoxaparin (LOVENOX) injection 30 mg  30 mg Subcutaneous Q12H Melvenia Stabs, MD   30 mg at 01/29/24 1000   feeding supplement (ENSURE  ENLIVE / ENSURE PLUS) liquid 237 mL  237 mL Oral BID BM Dorena Gander, MD   237 mL at 01/29/24 1010   folic acid  (FOLVITE ) tablet 1 mg  1 mg Oral Daily Melvenia Stabs, MD   1 mg at 01/29/24 1007   hydrALAZINE (APRESOLINE) injection 10 mg  10 mg Intravenous Q2H PRN Melvenia Stabs, MD   10 mg at 01/25/24 1210   hydrOXYzine (ATARAX) tablet 25 mg  25 mg Oral Q6H PRN Anda Bamberg, MD       ipratropium-albuterol  (DUONEB) 0.5-2.5 (3) MG/3ML nebulizer solution 3 mL  3 mL Nebulization Q6H Dorena Gander, MD   3 mL at 01/29/24 1408   lidocaine  (LIDODERM ) 5 % 2 patch  2 patch Transdermal Q24H Ninetta Basket, MD   2 patch at 01/29/24 0826   loperamide (IMODIUM)  capsule 2-4 mg  2-4 mg Oral PRN Lovick, Ayesha N, MD       metoprolol tartrate (LOPRESSOR) injection 5 mg  5 mg Intravenous Q6H PRN Melvenia Stabs, MD   5 mg at 01/25/24 4132   morphine  (PF) 4 MG/ML injection 4 mg  4 mg Intravenous Q4H PRN Melvenia Stabs, MD   4 mg at 01/25/24 1214   multivitamin with minerals tablet 1 tablet  1 tablet Oral Daily Lovick, Ayesha N, MD   1 tablet at 01/29/24 1009   ondansetron  (ZOFRAN -ODT) disintegrating tablet 4 mg  4 mg Oral Q6H PRN Melvenia Stabs, MD       Or   ondansetron  (ZOFRAN ) injection 4 mg  4 mg Intravenous Q6H PRN Melvenia Stabs, MD   4 mg at 01/27/24 1123   oxyCODONE  (Oxy IR/ROXICODONE ) immediate release tablet 5-10 mg  5-10 mg Oral Q4H PRN Melvenia Stabs, MD   10 mg at 01/29/24 1437   polyethylene glycol (MIRALAX  / GLYCOLAX ) packet 17 g  17 g Oral Daily PRN Melvenia Stabs, MD   17 g at 01/27/24 4401   polyethylene glycol (MIRALAX  / GLYCOLAX ) packet 17 g  17 g Oral BID Anda Bamberg, MD   17 g at 01/29/24 1000   spiritus frumenti (ethyl alcohol) solution 1 each  1 each Oral TID Dorena Gander, MD   1 each at 01/29/24 1437   thiamine  (VITAMIN B1) tablet 100 mg  100 mg Oral Daily Haviland, Julie, MD   100 mg at 01/29/24 1007   Or   thiamine  (VITAMIN B1) injection 100 mg  100 mg Intravenous Daily Haviland, Julie, MD         Discharge Medications: Please see discharge summary for a list of discharge medications.  Relevant Imaging Results:  Relevant Lab Results:   Additional Information SS #: [id number] -- VA approved 32 days  Neilani Duffee E Ozell Juhasz, LCSW

## 2024-01-29 NOTE — Plan of Care (Signed)
  Problem: Education: Goal: Knowledge of General Education information will improve Description: Including pain rating scale, medication(s)/side effects and non-pharmacologic comfort measures Outcome: Progressing   Problem: Clinical Measurements: Goal: Will remain free from infection Outcome: Progressing Goal: Diagnostic test results will improve Outcome: Progressing   Problem: Activity: Goal: Risk for activity intolerance will decrease Outcome: Progressing   Problem: Nutrition: Goal: Adequate nutrition will be maintained Outcome: Progressing   Problem: Pain Managment: Goal: General experience of comfort will improve and/or be controlled Outcome: Progressing   Problem: Safety: Goal: Ability to remain free from injury will improve Outcome: Progressing   Problem: Skin Integrity: Goal: Risk for impaired skin integrity will decrease Outcome: Progressing

## 2024-01-29 NOTE — TOC Progression Note (Signed)
 Transition of Care [facility]) - Progression Note    Patient Details  Name: Jason Rojas MRN: [id number] Date of Birth: Sep 27, 1961  Transition of Care [facility]) CM/SW Contact  Jannice Mends, LCSW Phone Number: 01/29/2024, 1:50 PM  Clinical Narrative:    Emailed [facility] SNF referral to [facility] as the fax was unsuccessful.    Expected Discharge Plan: Skilled Nursing Facility Barriers to Discharge: Continued Medical Work up  Expected Discharge Plan and Services       Living arrangements for the past 2 months: Single Family Home                                       Social Determinants of Health (SDOH) Interventions SDOH Screenings   Food Insecurity: No Food Insecurity (01/25/2024)  Housing: Low Risk  (01/25/2024)  Transportation Needs: No Transportation Needs (01/27/2024)  Utilities: Not At Risk (01/27/2024)  Tobacco Use: High Risk (01/24/2024)    Readmission Risk Interventions    01/28/2024   12:04 PM  Readmission Risk Prevention Plan  Transportation Screening Complete  PCP or Specialist Appt within 3-5 Days Complete  HRI or Home Care Consult Complete  Social Work Consult for Recovery Care Planning/Counseling Complete  Palliative Care Screening Not Applicable  Medication Review Oceanographer) Complete

## 2024-01-30 ENCOUNTER — Encounter (HOSPITAL_COMMUNITY): Admission: RE | Payer: Self-pay | Source: Home / Self Care

## 2024-01-30 ENCOUNTER — Ambulatory Visit (HOSPITAL_COMMUNITY): Admission: RE | Admit: 2024-01-30 | Source: Home / Self Care | Admitting: Internal Medicine

## 2024-01-30 SURGERY — COLONOSCOPY
Anesthesia: Choice

## 2024-01-30 NOTE — Progress Notes (Signed)
 Patient ID: Jason Rojas, male   DOB: Sep 07, 1961, 63 y.o.   MRN: [id number] Follow up - Trauma Critical Care   Patient Details:    Jason Rojas is an 63 y.o. male.  Lines/tubes : External Urinary Catheter (Active)  Securement Method None needed 01/30/24 0800  Site Assessment Clean, Dry, Intact 01/30/24 0800  Intervention No interventions needed at this time 01/30/24 0800  Output (mL) 425 mL 01/29/24 1800    Microbiology/Sepsis markers: Results for orders placed or performed during the hospital encounter of 01/24/24  MRSA Next Gen by PCR, Nasal     Status: None   Collection Time: 01/24/24  6:25 PM   Specimen: Nasal Mucosa; Nasal Swab  Result Value Ref Range Status   MRSA by PCR Next Gen NOT DETECTED NOT DETECTED Final    Comment: (NOTE) The GeneXpert MRSA Assay (FDA approved for NASAL specimens only), is one component of a comprehensive MRSA colonization surveillance program. It is not intended to diagnose MRSA infection nor to guide or monitor treatment for MRSA infections. Test performance is not FDA approved in patients less than 69 years old. Performed at [facility], [address] [place], [place] [address]     Anti-infectives:  Anti-infectives (From admission, onward)    Start     Dose/Rate Route Frequency Ordered Stop   01/24/24 0900  ceFAZolin  (ANCEF ) IVPB 2g/100 mL premix        2 g 200 mL/hr over 30 Minutes Intravenous STAT 01/24/24 0854 01/24/24 0941      Consults: Treatment Team:  Md, Trauma, MD    Studies:    Events:  Subjective:    Overnight Issues:  Asking for beer Objective:  Vital signs for last 24 hours: Temp:  [97.5 F (36.4 C)-98.8 F (37.1 C)] 97.5 F (36.4 C) (05/01 0735) Pulse Rate:  [58-80] 80 (05/01 1000) Resp:  [15-36] 17 (05/01 1000) BP: (98-137)/(58-80) 137/80 (05/01 1000) SpO2:  [90 %-100 %] 95 % (05/01 1000) FiO2 (%):  [36 %] 36 % (05/01 0735)  Hemodynamic parameters for last 24 hours:    Intake/Output from  previous day: 04/30 0701 - 05/01 0700 In: 278.8 [P.O.:120; I.V.:158.8] Out: 725 [Urine:725]  Intake/Output this shift: Total I/O In: 127.9 [I.V.:127.9] Out: -   Vent settings for last 24 hours: FiO2 (%):  [36 %] 36 %  Physical Exam:  General: no respiratory distress Neuro: does F/C HEENT/Neck: no JVD Resp: some rhonchi, L ribs tender CVS: RRR GI: soft, NT, ND Extremities: calves soft  No results found for this or any previous visit (from the past 24 hours).  Assessment & Plan: Present on Admission:  Fall    LOS: 6 days   Additional comments:I reviewed the patient's new clinical lab test results. / 63 y/o M s/p Fall on stairs L Rib FX 3-10 - multimodal pain control, IS/Pulm toilet L hemopneumothorax - CXR - 5-10% ptx on left, supplemental O2 via nasal cannunla; repeat film 4/26 PTX resolved, multimodal pain control L pulmonary contusion COPD/acute hypoxic respiratory failure - PRN albuterol  nebs, 4L, seems to be stabilizing HTN - home norvasc  ordered, also takes coreg  Hx Prostate CA - [id number]; bone met on R 10th rib; completed radiation therapy. On ADT w/ Zytiga/prednisone  daily. Hemorrhoids FEN - reg; CIWA - precedex  given heavy use history and suspected alcohol  withdrawal seizures in the past, spiritus frumenti, librium  VTE - SCD's, LMWH ID - None  Dispo - ICU, Dex off now. Transfer to 4NP once durably off  Dex.  Critical Care Total Time*: 34 Minutes  Dorena Gander, MD, MPH, FACS Trauma & General Surgery Use [email or web address] to contact on call provider  01/30/2024  *Care during the described time interval was provided by me. I have reviewed this patient's available data, including medical history, events of note, physical examination and test results as part of my evaluation.

## 2024-01-30 NOTE — Progress Notes (Signed)
 Physical Therapy Treatment Patient Details Name: Jason Rojas MRN: [id number] DOB: 1961/02/10 Today's Date: 01/30/2024   History of Present Illness Pt is a 63 y.o. male who presented 01/24/24 s/p x2 falls outside. ED workup significant for L Rib FX 3-10, L hemopneumothorax, and L pulmonary contusion. On CIWA protocol. PMH: COPD, HTN, PTSD, sleep apnea, EtOH abuse, and prostate CA    PT Comments  Pt initially reluctant to participate in therapy, but after education on importance, pt agreeable. Able to transition to edge of bed towards right slowly with mod assist. +2 to stand from edge of bed and pivot to chair with increased posterior lean. SpO2 reading 80's on 4L O2 with poor pleth. Instructed on incentive spirometer; pt only pulling ~250 ml. Patient will benefit from continued inpatient follow up therapy, <3 hours/day.    If plan is discharge home, recommend the following: A lot of help with bathing/dressing/bathroom;Assistance with cooking/housework;Direct supervision/assist for financial management;Direct supervision/assist for medications management;Assist for transportation;Help with stairs or ramp for entrance;Supervision due to cognitive status;A lot of help with walking and/or transfers   Can travel by private vehicle     No  Equipment Recommendations  Rolling walker (2 wheels);BSC/3in1    Recommendations for Other Services       Precautions / Restrictions Precautions Precautions: Fall;Other (comment) Recall of Precautions/Restrictions: Impaired Precaution/Restrictions Comments: watch SpO2, on 4L this session Restrictions Weight Bearing Restrictions Per Provider Order: No     Mobility  Bed Mobility Overal bed mobility: Needs Assistance Bed Mobility: Supine to Sit     Supine to sit: Mod assist, HOB elevated     General bed mobility comments: Very slow to initiate, able to bring BLE's off edge of bed, use of RUE on railing. Assist to scoot L hip out using bed pad, and  trunk to upright. Exiting on right side of bed    Transfers Overall transfer level: Needs assistance Equipment used: Rolling walker (2 wheels) Transfers: Sit to/from Stand, Bed to chair/wheelchair/BSC Sit to Stand: Mod assist, +2 physical assistance Stand pivot transfers: Mod assist, +2 physical assistance         General transfer comment: modA + 2 to stand from edge of bed, increased posterior lean and cues to push down rather than pull up on walker. Stood x 2 from edge of bed and pivoted towards left to chair with assist for balance and walker management    Ambulation/Gait                   Stairs             Wheelchair Mobility     Tilt Bed    Modified Rankin (Stroke Patients Only)       Balance Overall balance assessment: Needs assistance Sitting-balance support: Feet supported, Single extremity supported, No upper extremity supported Sitting balance-Leahy Scale: Fair Sitting balance - Comments: CGA   Standing balance support: During functional activity, Bilateral upper extremity supported Standing balance-Leahy Scale: Poor Standing balance comment: Pt needs UE support for standing balance.                            Communication Communication Communication: Impaired Factors Affecting Communication: Reduced clarity of speech  Cognition Arousal: Alert Behavior During Therapy: Flat affect   PT - Cognitive impairments: Memory, Attention, Problem solving, Sequencing, Safety/Judgement, Initiation  PT - Cognition Comments: Decreased initiation in all functional tasks Following commands: Impaired Following commands impaired: Follows one step commands with increased time    Cueing Cueing Techniques: Verbal cues, Tactile cues, Gestural cues  Exercises      General Comments        Pertinent Vitals/Pain Pain Assessment Pain Assessment: Faces Faces Pain Scale: Hurts little more Pain Location: L  ribs Pain Descriptors / Indicators: Discomfort, Grimacing, Guarding, Moaning Pain Intervention(s): Limited activity within patient's tolerance, Monitored during session    Home Living                          Prior Function            PT Goals (current goals can now be found in the care plan section) Acute Rehab PT Goals Patient Stated Goal: to reduce pain PT Goal Formulation: With patient Time For Goal Achievement: 02/08/24 Potential to Achieve Goals: Good    Frequency    Min 2X/week      PT Plan      Co-evaluation              AM-PAC PT "6 Clicks" Mobility   Outcome Measure  Help needed turning from your back to your side while in a flat bed without using bedrails?: A Little Help needed moving from lying on your back to sitting on the side of a flat bed without using bedrails?: A Lot Help needed moving to and from a bed to a chair (including a wheelchair)?: A Lot Help needed standing up from a chair using your arms (e.g., wheelchair or bedside chair)?: A Lot Help needed to walk in hospital room?: Total Help needed climbing 3-5 steps with a railing? : Total 6 Click Score: 11    End of Session Equipment Utilized During Treatment: Gait belt;Oxygen Activity Tolerance: Patient limited by pain Patient left: in chair;with call bell/phone within reach;with chair alarm set Nurse Communication: Mobility status PT Visit Diagnosis: Unsteadiness on feet (R26.81);Other abnormalities of gait and mobility (R26.89);Muscle weakness (generalized) (M62.81);Difficulty in walking, not elsewhere classified (R26.2);Pain Pain - Right/Left: Left Pain - part of body:  (ribs)     Time: 1137-1209 PT Time Calculation (min) (ACUTE ONLY): 32 min  Charges:    $Therapeutic Activity: 23-37 mins PT General Charges $$ ACUTE PT VISIT: 1 Visit                     Jason Rojas, PT, DPT Acute Rehabilitation Services Office [phone]    Claria Crofts 01/30/2024,  3:03 PM

## 2024-01-30 NOTE — TOC Progression Note (Signed)
 Transition of Care [facility]) - Progression Note    Patient Details  Name: Jason Rojas MRN: [id number] Date of Birth: 1961/07/12  Transition of Care [facility]) CM/SW Contact  Tavis Kring E Kirk Sampley, LCSW Phone Number: 01/30/2024, 2:15 PM  Clinical Narrative:    -[facility]: Maggie in Admissions states they may be able to offer once patient is medically stable.   Expected Discharge Plan: Skilled Nursing Facility Barriers to Discharge: Continued Medical Work up  Expected Discharge Plan and Services       Living arrangements for the past 2 months: Single Family Home                                       Social Determinants of Health (SDOH) Interventions SDOH Screenings   Food Insecurity: No Food Insecurity (01/25/2024)  Housing: Low Risk  (01/25/2024)  Transportation Needs: No Transportation Needs (01/27/2024)  Utilities: Not At Risk (01/27/2024)  Tobacco Use: High Risk (01/24/2024)    Readmission Risk Interventions    01/28/2024   12:04 PM  Readmission Risk Prevention Plan  Transportation Screening Complete  PCP or Specialist Appt within 3-5 Days Complete  HRI or Home Care Consult Complete  Social Work Consult for Recovery Care Planning/Counseling Complete  Palliative Care Screening Not Applicable  Medication Review [facility]) Complete

## 2024-01-30 NOTE — TOC Progression Note (Addendum)
 Transition of Care [facility]) - Progression Note    Patient Details  Name: Jason Rojas MRN: [id number] Date of Birth: Jul 03, 1961  Transition of Care [facility]) CM/SW Contact  Randell Bussing, LCSW Phone Number: 01/30/2024, 9:04 AM  Clinical Narrative:    [facility]: sent referral to [facility] [place]: sent referral to [facility]: sent referral to [facility]: declined patient -[facility]: sent referral to [facility] [place]: awaiting response -[facility] [place]: spoke with Maggie in Admissions, faxed referral for her to review -[facility]: awaiting response -[facility]: sent referral to [facility]   Expected Discharge Plan: Skilled Nursing Facility Barriers to Discharge: Continued Medical Work up  Expected Discharge Plan and Services       Living arrangements for the past 2 months: Single Family Home                                       Social Determinants of Health (SDOH) Interventions SDOH Screenings   Food Insecurity: No Food Insecurity (01/25/2024)  Housing: Low Risk  (01/25/2024)  Transportation Needs: No Transportation Needs (01/27/2024)  Utilities: Not At Risk (01/27/2024)  Tobacco Use: High Risk (01/24/2024)    Readmission Risk Interventions    01/28/2024   12:04 PM  Readmission Risk Prevention Plan  Transportation Screening Complete  PCP or Specialist Appt within 3-5 Days Complete  HRI or Home Care Consult Complete  Social Work Consult for Recovery Care Planning/Counseling Complete  Palliative Care Screening Not Applicable  Medication Review Oceanographer) Complete

## 2024-01-31 ENCOUNTER — Inpatient Hospital Stay (HOSPITAL_COMMUNITY): Admitting: Registered Nurse

## 2024-01-31 ENCOUNTER — Inpatient Hospital Stay (HOSPITAL_COMMUNITY)

## 2024-01-31 DIAGNOSIS — J449 Chronic obstructive pulmonary disease, unspecified: Secondary | ICD-10-CM

## 2024-01-31 LAB — GLUCOSE, CAPILLARY: Glucose-Capillary: 143 mg/dL — ABNORMAL HIGH (ref 70–99)

## 2024-01-31 LAB — POCT I-STAT 7, (LYTES, BLD GAS, ICA,H+H)
Acid-Base Excess: 0 mmol/L (ref 0.0–2.0)
Bicarbonate: 28.9 mmol/L — ABNORMAL HIGH (ref 20.0–28.0)
Calcium, Ion: 1.24 mmol/L (ref 1.15–1.40)
HCT: 37 % — ABNORMAL LOW (ref 39.0–52.0)
Hemoglobin: 12.6 g/dL — ABNORMAL LOW (ref 13.0–17.0)
O2 Saturation: 97 %
Patient temperature: 97.8
Potassium: 5.3 mmol/L — ABNORMAL HIGH (ref 3.5–5.1)
Sodium: 133 mmol/L — ABNORMAL LOW (ref 135–145)
TCO2: 31 mmol/L (ref 22–32)
pCO2 arterial: 65.9 mmHg (ref 32–48)
pH, Arterial: 7.248 — ABNORMAL LOW (ref 7.35–7.45)
pO2, Arterial: 109 mmHg — ABNORMAL HIGH (ref 83–108)

## 2024-01-31 MED ORDER — PROPOFOL 10 MG/ML IV BOLUS
INTRAVENOUS | Status: DC | PRN
  Administered 2024-01-31: 100 mg via INTRAVENOUS

## 2024-01-31 MED ORDER — SUCCINYLCHOLINE CHLORIDE 200 MG/10ML IV SOSY
PREFILLED_SYRINGE | INTRAVENOUS | Status: DC | PRN
  Administered 2024-01-31: 120 mg via INTRAVENOUS

## 2024-01-31 MED ORDER — SODIUM CHLORIDE 0.9% FLUSH
INTRAVENOUS | Status: DC | PRN
  Administered 2024-01-31: 10 mL via INTRAVENOUS

## 2024-01-31 MED ORDER — POLYETHYLENE GLYCOL 3350 17 G PO PACK: 17.0000 g | PACK | Freq: Every day | ORAL | Status: DC | PRN

## 2024-01-31 MED ORDER — FOLIC ACID 1 MG PO TABS
1.0000 mg | ORAL_TABLET | Freq: Every day | ORAL | Status: DC
  Administered 2024-02-01 – 2024-02-21 (×21): 1 mg
  Filled 2024-01-31 (×21): qty 1

## 2024-01-31 MED ORDER — FENTANYL CITRATE PF 50 MCG/ML IJ SOSY: 25.0000 ug | PREFILLED_SYRINGE | Freq: Once | INTRAMUSCULAR | Status: DC

## 2024-01-31 MED ORDER — POLYETHYLENE GLYCOL 3350 17 G PO PACK: 17.0000 g | PACK | Freq: Every day | ORAL | Status: DC

## 2024-01-31 MED ORDER — FENTANYL CITRATE PF 50 MCG/ML IJ SOSY: 25.0000 ug | PREFILLED_SYRINGE | INTRAMUSCULAR | Status: DC | PRN

## 2024-01-31 MED ORDER — FENTANYL CITRATE PF 50 MCG/ML IJ SOSY
25.0000 ug | PREFILLED_SYRINGE | INTRAMUSCULAR | Status: DC | PRN
  Administered 2024-02-01 – 2024-02-02 (×5): 50 ug via INTRAVENOUS
  Administered 2024-02-02 (×2): 100 ug via INTRAVENOUS
  Administered 2024-02-02 (×2): 50 ug via INTRAVENOUS
  Administered 2024-02-02: 75 ug via INTRAVENOUS
  Administered 2024-02-04: 25 ug via INTRAVENOUS
  Administered 2024-02-05 – 2024-02-06 (×2): 50 ug via INTRAVENOUS
  Administered 2024-02-06: 100 ug via INTRAVENOUS
  Administered 2024-02-06: 25 ug via INTRAVENOUS
  Administered 2024-02-07 (×2): 50 ug via INTRAVENOUS
  Administered 2024-02-08 (×2): 100 ug via INTRAVENOUS
  Administered 2024-02-08 (×3): 50 ug via INTRAVENOUS
  Administered 2024-02-08: 75 ug via INTRAVENOUS
  Administered 2024-02-08 (×2): 50 ug via INTRAVENOUS
  Filled 2024-01-31 (×6): qty 1
  Filled 2024-01-31 (×2): qty 2
  Filled 2024-01-31: qty 1

## 2024-01-31 MED ORDER — DOCUSATE SODIUM 50 MG/5ML PO LIQD
100.0000 mg | Freq: Two times a day (BID) | ORAL | Status: DC
Start: 2024-01-31 — End: 2024-02-02
  Administered 2024-02-01 (×2): 100 mg
  Filled 2024-01-31 (×2): qty 10

## 2024-01-31 MED ORDER — POLYETHYLENE GLYCOL 3350 17 G PO PACK: 17.0000 g | PACK | Freq: Two times a day (BID) | ORAL | Status: DC

## 2024-01-31 MED ORDER — OXYCODONE HCL 5 MG PO TABS
5.0000 mg | ORAL_TABLET | ORAL | Status: DC | PRN
  Administered 2024-02-11: 5 mg
  Administered 2024-02-11 – 2024-02-21 (×17): 10 mg
  Filled 2024-01-31 (×3): qty 2
  Filled 2024-01-31: qty 1
  Filled 2024-01-31 (×14): qty 2

## 2024-01-31 MED ORDER — AMLODIPINE BESYLATE 5 MG PO TABS
5.0000 mg | ORAL_TABLET | Freq: Every day | ORAL | Status: DC
  Administered 2024-02-01 – 2024-02-21 (×21): 5 mg
  Filled 2024-01-31 (×21): qty 1

## 2024-01-31 MED ORDER — ACETAMINOPHEN 160 MG/5ML PO SOLN
650.0000 mg | Freq: Four times a day (QID) | ORAL | Status: DC
  Administered 2024-01-31 – 2024-02-15 (×54): 650 mg
  Filled 2024-01-31 (×55): qty 20.3

## 2024-01-31 MED ORDER — FENTANYL BOLUS VIA INFUSION: 25.0000 ug | INTRAVENOUS | Status: DC | PRN

## 2024-01-31 MED ORDER — MIDAZOLAM HCL 2 MG/2ML IJ SOLN
1.0000 mg | INTRAMUSCULAR | Status: DC | PRN
  Administered 2024-02-02: 2 mg via INTRAVENOUS
  Filled 2024-01-31: qty 2

## 2024-01-31 MED ORDER — ADULT MULTIVITAMIN W/MINERALS CH
1.0000 | ORAL_TABLET | Freq: Every day | ORAL | Status: DC
  Administered 2024-02-01 – 2024-02-04 (×4): 1
  Filled 2024-01-31 (×4): qty 1

## 2024-01-31 MED ORDER — FENTANYL 2500MCG IN NS 250ML (10MCG/ML) PREMIX INFUSION
25.0000 ug/h | INTRAVENOUS | Status: DC
  Administered 2024-01-31: 25 ug/h via INTRAVENOUS
  Filled 2024-01-31: qty 250

## 2024-01-31 MED ORDER — PROPOFOL 1000 MG/100ML IV EMUL
0.0000 ug/kg/min | INTRAVENOUS | Status: DC
  Administered 2024-01-31: 5 ug/kg/min via INTRAVENOUS
  Administered 2024-02-01 (×2): 45 ug/kg/min via INTRAVENOUS
  Administered 2024-02-02: 5 ug/kg/min via INTRAVENOUS
  Filled 2024-01-31 (×6): qty 100

## 2024-01-31 NOTE — Progress Notes (Signed)
 Patient ID: Jason Rojas, male   DOB: 09/10/1961, 63 y.o.   MRN: [id number]      Subjective: Up in chair, not eating much this AM ROS negative except as listed above. Objective: Vital signs in last 24 hours: Temp:  [97.6 F (36.4 C)-99.4 F (37.4 C)] 98.6 F (37 C) (05/02 0400) Pulse Rate:  [88-143] 94 (05/02 0800) Resp:  [14-30] 17 (05/02 0800) BP: (110-161)/(68-95) 143/78 (05/02 0800) SpO2:  [85 %-100 %] 95 % (05/02 0800) FiO2 (%):  [36 %-44 %] 44 % (05/02 0747) Last BM Date : 01/28/24  Intake/Output from previous day: 05/01 0701 - 05/02 0700 In: 127.9 [I.V.:127.9] Out: 500 [Urine:500] Intake/Output this shift: No intake/output data recorded.  General appearance: alert and cooperative Resp: tender L ribs, min wheeze Cardio: S1, S2 normal GI: soft, NT Extremities: calves soft  Lab Results: CBC  No results for input(s): "WBC", "HGB", "HCT", "PLT" in the last 72 hours. BMET No results for input(s): "NA", "K", "CL", "CO2", "GLUCOSE", "BUN", "CREATININE", "CALCIUM" in the last 72 hours. PT/INR No results for input(s): "LABPROT", "INR" in the last 72 hours. ABG No results for input(s): "PHART", "HCO3" in the last 72 hours.  Invalid input(s): "PCO2", "PO2"  Studies/Results: No results found.  Anti-infectives: Anti-infectives (From admission, onward)    Start     Dose/Rate Route Frequency Ordered Stop   01/24/24 0900  ceFAZolin  (ANCEF ) IVPB 2g/100 mL premix        2 g 200 mL/hr over 30 Minutes Intravenous STAT 01/24/24 0854 01/24/24 0941       Assessment/Plan: 63 y/o M s/p Fall on stairs L Rib FX 3-10 - multimodal pain control, IS/Pulm toilet L hemopneumothorax - CXR - 5-10% ptx on left, supplemental O2 via nasal cannunla; repeat film 4/26 PTX resolved, multimodal pain control L pulmonary contusion COPD/acute hypoxic respiratory failure - PRN albuterol  nebs, 6L, seems to be stabilizing HTN - home norvasc  ordered, also takes coreg  Hx Prostate CA - [id number]; 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 On ADT w/ Zytiga/prednisone  daily. Hemorrhoids FEN - diet VTE - SCD's, LMWH ID - None  Dispo - to 4NP or 4E, PT/OT, wean O2 as able   LOS: 7 days    Dorena Gander, MD, MPH, FACS Trauma & General Surgery Use [email or web address] to contact on call provider  01/31/2024

## 2024-01-31 NOTE — Progress Notes (Signed)
 Notified by charge RN that pt is going to be intubated. On my arrival, pt being bagged by 4E RN and Trauma MD Stechschulte. Anesthesia team entered room just after RR RN. Pt was intubated by anesthesia team using 100mg  propofol  and 120mg  succinylcholine. Pt transported to 3M12 with 4E RN, RRT x 2, and RR RN.

## 2024-01-31 NOTE — Progress Notes (Signed)
 Patient lethargic, opens eyes to sternal rub but unable to answer any questions and patient with increasing oxygen demands. Was on 4L [place] >92%, titrated up to 15L HFNC when Spo2 83%. Attempted Venturi mask 14L 55%, but patient's spo2 remained at 86%. MD called, responded to bedside. Respiratory called. Patient intubated at bedside and transported to ICU with rapid response RN, RT, and primary RN. Bedside report give to transferring ICU RN.

## 2024-01-31 NOTE — Progress Notes (Signed)
 Called as patient hypoxic and obtunded on the floor.  Contacted anesthesia team who intubated the patient.  Transfer back to ICU.  Attempted to contact family to update but was sent to voicemail.  Will have respiratory retract ET tube per chest x-ray findings.  No pneumothorax on chest x-ray.   Junie Olds, MD General, Bariatric and Minimally Invasive Surgery [facility]

## 2024-01-31 NOTE — Progress Notes (Signed)
 Physical Therapy Treatment Patient Details Name: Jason Rojas MRN: [id number] DOB: 10-26-1960 Today's Date: 01/31/2024   History of Present Illness Pt is a 63 y.o. male who presented 01/24/24 s/p x2 falls outside. ED workup significant for L Rib FX 3-10, L hemopneumothorax, and L pulmonary contusion. On CIWA protocol. PMH: COPD, HTN, PTSD, sleep apnea, EtOH abuse, and prostate CA    PT Comments  Pt asleep upon arrival; able to arouse with verbal stimulation, however, very lethargic throughout session with poor engagement and initiation. Pt requiring increased assist this date, +2 for bed mobility and transfers from bed to chair. Demonstrates right lateral and posterior lean sitting edge of bed with decreased righting reaction. Spo2 88-94% on 6L O2. Would benefit from transferring out of bed daily to chair to improve respiratory status, arousal, and sitting tolerance.     If plan is discharge home, recommend the following: A lot of help with bathing/dressing/bathroom;Assistance with cooking/housework;Direct supervision/assist for financial management;Direct supervision/assist for medications management;Assist for transportation;Help with stairs or ramp for entrance;Supervision due to cognitive status;A lot of help with walking and/or transfers   Can travel by private vehicle     No  Equipment Recommendations  Rolling walker (2 wheels);BSC/3in1    Recommendations for Other Services       Precautions / Restrictions Precautions Precautions: Fall;Other (comment) Recall of Precautions/Restrictions: Impaired Precaution/Restrictions Comments: watch SpO2, on 4L this session Restrictions Weight Bearing Restrictions Per Provider Order: No     Mobility  Bed Mobility Overal bed mobility: Needs Assistance Bed Mobility: Supine to Sit     Supine to sit: HOB elevated, Max assist     General bed mobility comments: No initiation noted by pt, assist to guide LE's off edge of bed and bring trunk to  upright    Transfers Overall transfer level: Needs assistance Equipment used: 2 person hand held assist Transfers: Sit to/from Stand, Bed to chair/wheelchair/BSC Sit to Stand: +2 physical assistance, Max assist Stand pivot transfers: +2 physical assistance, Max assist         General transfer comment: Max multimodal cues for anterior weight shift, cued to "put head on therapist shoulder." use of bed pad to lift hips and pivot over to chair on left. Additional partial stand from chair to take out 2nd pad    Ambulation/Gait                   Stairs             Wheelchair Mobility     Tilt Bed    Modified Rankin (Stroke Patients Only)       Balance Overall balance assessment: Needs assistance Sitting-balance support: Feet supported, Single extremity supported, No upper extremity supported Sitting balance-Leahy Scale: Poor Sitting balance - Comments: right lateral and posterior lean   Standing balance support: During functional activity, Bilateral upper extremity supported Standing balance-Leahy Scale: Zero                              Communication Communication Communication: Impaired Factors Affecting Communication: Reduced clarity of speech  Cognition Arousal: Lethargic Behavior During Therapy: Flat affect   PT - Cognitive impairments: Memory, Attention, Problem solving, Sequencing, Safety/Judgement, Initiation                       PT - Cognition Comments: Decreased initiation in all functional tasks, increasingly lethargic this date, requiring more assist for all aspects  Following commands: Impaired Following commands impaired: Follows one step commands inconsistently    Cueing Cueing Techniques: Verbal cues, Tactile cues, Gestural cues  Exercises      General Comments        Pertinent Vitals/Pain Pain Assessment Pain Assessment: Faces Faces Pain Scale: Hurts a little bit Pain Location: L ribs Pain Descriptors /  Indicators: Discomfort, Grimacing, Guarding, Moaning Pain Intervention(s): Monitored during session, Limited activity within patient's tolerance    Home Living                          Prior Function            PT Goals (current goals can now be found in the care plan section) Acute Rehab PT Goals Patient Stated Goal: to reduce pain PT Goal Formulation: With patient Time For Goal Achievement: 02/08/24 Potential to Achieve Goals: Fair Progress towards PT goals: Not progressing toward goals - comment (increasing lethargy)    Frequency    Min 2X/week      PT Plan      Co-evaluation              AM-PAC PT "6 Clicks" Mobility   Outcome Measure  Help needed turning from your back to your side while in a flat bed without using bedrails?: A Lot Help needed moving from lying on your back to sitting on the side of a flat bed without using bedrails?: A Lot Help needed moving to and from a bed to a chair (including a wheelchair)?: Total Help needed standing up from a chair using your arms (e.g., wheelchair or bedside chair)?: Total Help needed to walk in hospital room?: Total Help needed climbing 3-5 steps with a railing? : Total 6 Click Score: 8    End of Session Equipment Utilized During Treatment: Gait belt;Oxygen Activity Tolerance: Patient limited by lethargy Patient left: in chair;with call bell/phone within reach;with chair alarm set Nurse Communication: Mobility status;Other (comment) (lethargy, increased assist) PT Visit Diagnosis: Unsteadiness on feet (R26.81);Other abnormalities of gait and mobility (R26.89);Muscle weakness (generalized) (M62.81);Difficulty in walking, not elsewhere classified (R26.2);Pain Pain - Right/Left: Left Pain - part of body:  (ribs)     Time: 2952-8413 PT Time Calculation (min) (ACUTE ONLY): 23 min  Charges:    $Therapeutic Activity: 23-37 mins PT General Charges $$ ACUTE PT VISIT: 1 Visit                      Verdia Glad, PT, DPT Acute Rehabilitation Services Office [phone]    Claria Crofts 01/31/2024, 9:12 AM

## 2024-01-31 NOTE — Progress Notes (Signed)
 Pt received in 4E12 from 4N.  Family at bedside.  Oriented to room and call bell.  Pt lethargic and difficult to understand, but from what I gathered he is alert to self only.  Transferred with scheduled ETOH, which was placed in nourishment fridge at family request.

## 2024-01-31 NOTE — Progress Notes (Signed)
   01/31/24 0800  Neurological  L Pupil Size (mm) 8  L Pupil Shape Round  L Pupil Reaction Nonreactive   Discovered left pupil to be unequal and non-reactive upon shift assessment. Pt claimed that the left pupil has been enlarged and non-reactive since a childhood injury to the eye. Notified Trauma MD and MD assessed at bedside. No concern for any changes at this time.   01/31/2024 Alva Jewels, RN, BSN 11:11 AM

## 2024-01-31 NOTE — Anesthesia Procedure Notes (Signed)
 Procedure Name: Intubation Date/Time: 01/31/2024 9:49 PM  Performed by: Cataldo Cosgriff C., CRNAPre-anesthesia Checklist: Patient identified, Emergency Drugs available, Suction available, Patient being monitored and Timeout performed Patient Re-evaluated:Patient Re-evaluated prior to induction Oxygen Delivery Method: Ambu bag Preoxygenation: Pre-oxygenation with 100% oxygen Induction Type: IV induction and Cricoid Pressure applied Ventilation: Mask ventilation without difficulty Laryngoscope Size: Glidescope and 4 Grade View: Grade I Tube type: Oral Tube size: 7.5 mm Number of attempts: 1 Airway Equipment and Method: Stylet Placement Confirmation: ETT inserted through vocal cords under direct vision, positive ETCO2, CO2 detector and breath sounds checked- equal and bilateral Secured at: 23 cm Tube secured with: Tape Dental Injury: Teeth and Oropharynx as per pre-operative assessment

## 2024-02-01 LAB — POCT I-STAT 7, (LYTES, BLD GAS, ICA,H+H)
Acid-Base Excess: 2 mmol/L (ref 0.0–2.0)
Acid-Base Excess: 3 mmol/L — ABNORMAL HIGH (ref 0.0–2.0)
Bicarbonate: 24.4 mmol/L (ref 20.0–28.0)
Bicarbonate: 27.5 mmol/L (ref 20.0–28.0)
Calcium, Ion: 1.16 mmol/L (ref 1.15–1.40)
Calcium, Ion: 1.2 mmol/L (ref 1.15–1.40)
HCT: 33 % — ABNORMAL LOW (ref 39.0–52.0)
HCT: 33 % — ABNORMAL LOW (ref 39.0–52.0)
Hemoglobin: 11.2 g/dL — ABNORMAL LOW (ref 13.0–17.0)
Hemoglobin: 11.2 g/dL — ABNORMAL LOW (ref 13.0–17.0)
O2 Saturation: 100 %
O2 Saturation: 99 %
Patient temperature: 97.5
Potassium: 4.6 mmol/L (ref 3.5–5.1)
Potassium: 4.4 mmol/L (ref 3.5–5.1)
Sodium: 134 mmol/L — ABNORMAL LOW (ref 135–145)
Sodium: 133 mmol/L — ABNORMAL LOW (ref 135–145)
TCO2: 25 mmol/L (ref 22–32)
TCO2: 29 mmol/L (ref 22–32)
pCO2 arterial: 29.5 mmHg — ABNORMAL LOW (ref 32–48)
pCO2 arterial: 41.7 mmHg (ref 32–48)
pH, Arterial: 7.522 — ABNORMAL HIGH (ref 7.35–7.45)
pH, Arterial: 7.428 (ref 7.35–7.45)
pO2, Arterial: 149 mmHg — ABNORMAL HIGH (ref 83–108)
pO2, Arterial: 119 mmHg — ABNORMAL HIGH (ref 83–108)

## 2024-02-01 LAB — CBC
HCT: 35.9 % — ABNORMAL LOW (ref 39.0–52.0)
Hemoglobin: 11.6 g/dL — ABNORMAL LOW (ref 13.0–17.0)
MCH: 27.6 pg (ref 26.0–34.0)
MCHC: 32.3 g/dL (ref 30.0–36.0)
MCV: 85.3 fL (ref 80.0–100.0)
Platelets: 371 10*3/uL (ref 150–400)
RBC: 4.21 MIL/uL — ABNORMAL LOW (ref 4.22–5.81)
RDW: 14.3 % (ref 11.5–15.5)
WBC: 10.8 10*3/uL — ABNORMAL HIGH (ref 4.0–10.5)
nRBC: 0 % (ref 0.0–0.2)

## 2024-02-01 LAB — GLUCOSE, CAPILLARY
Glucose-Capillary: 101 mg/dL — ABNORMAL HIGH (ref 70–99)
Glucose-Capillary: 105 mg/dL — ABNORMAL HIGH (ref 70–99)
Glucose-Capillary: 109 mg/dL — ABNORMAL HIGH (ref 70–99)
Glucose-Capillary: 116 mg/dL — ABNORMAL HIGH (ref 70–99)
Glucose-Capillary: 121 mg/dL — ABNORMAL HIGH (ref 70–99)

## 2024-02-01 LAB — TRIGLYCERIDES: Triglycerides: 150 mg/dL — ABNORMAL HIGH (ref <150)

## 2024-02-01 LAB — BASIC METABOLIC PANEL WITH GFR
Anion gap: 14 (ref 5–15)
BUN: 29 mg/dL — ABNORMAL HIGH (ref 8–23)
CO2: 26 mmol/L (ref 22–32)
Calcium: 9.1 mg/dL (ref 8.9–10.3)
Chloride: 93 mmol/L — ABNORMAL LOW (ref 98–111)
Creatinine, Ser: 1.08 mg/dL (ref 0.61–1.24)
GFR, Estimated: >60 mL/min (ref >60)
Glucose, Bld: 107 mg/dL — ABNORMAL HIGH (ref 70–99)
Potassium: 4.2 mmol/L (ref 3.5–5.1)
Sodium: 133 mmol/L — ABNORMAL LOW (ref 135–145)

## 2024-02-01 MED ORDER — THIAMINE MONONITRATE 100 MG PO TABS
100.0000 mg | ORAL_TABLET | Freq: Every day | ORAL | Status: DC
  Administered 2024-02-02 – 2024-02-03 (×2): 100 mg
  Filled 2024-02-01 (×2): qty 1

## 2024-02-01 MED ORDER — DOCUSATE SODIUM 50 MG/5ML PO LIQD
100.0000 mg | Freq: Two times a day (BID) | ORAL | Status: DC
  Administered 2024-02-02 – 2024-02-21 (×33): 100 mg
  Filled 2024-02-01 (×35): qty 10

## 2024-02-01 MED ORDER — ORAL CARE MOUTH RINSE: 15.0000 mL | OROMUCOSAL | Status: DC | PRN

## 2024-02-01 MED ORDER — FENTANYL 2500MCG IN NS 250ML (10MCG/ML) PREMIX INFUSION
50.0000 ug/h | INTRAVENOUS | Status: DC
  Administered 2024-02-01: 50 ug/h via INTRAVENOUS
  Administered 2024-02-02 – 2024-02-03 (×2): 100 ug/h via INTRAVENOUS
  Administered 2024-02-04: 30 ug/h via INTRAVENOUS
  Administered 2024-02-06 – 2024-02-08 (×2): 50 ug/h via INTRAVENOUS
  Administered 2024-02-09: 100 ug/h via INTRAVENOUS
  Filled 2024-02-01 (×7): qty 250

## 2024-02-01 MED ORDER — ORAL CARE MOUTH RINSE
15.0000 mL | OROMUCOSAL | Status: DC
  Administered 2024-02-01 – 2024-02-21 (×228): 15 mL via OROMUCOSAL

## 2024-02-01 MED ORDER — POLYETHYLENE GLYCOL 3350 17 G PO PACK
17.0000 g | PACK | Freq: Every day | ORAL | Status: DC
  Administered 2024-02-01 – 2024-02-21 (×17): 17 g
  Filled 2024-02-01 (×18): qty 1

## 2024-02-01 NOTE — Plan of Care (Signed)
  Problem: Clinical Measurements: Goal: Ability to maintain clinical measurements within normal limits will improve Outcome: Progressing Goal: Diagnostic test results will improve Outcome: Progressing   

## 2024-02-01 NOTE — Plan of Care (Signed)
  Problem: Education: Goal: Knowledge of General Education information will improve Description: Including pain rating scale, medication(s)/side effects and non-pharmacologic comfort measures Outcome: Progressing   Problem: Clinical Measurements: Goal: Respiratory complications will improve Outcome: Progressing   Problem: Nutrition: Goal: Adequate nutrition will be maintained Outcome: Progressing   Problem: Elimination: Goal: Will not experience complications related to urinary retention Outcome: Progressing   Problem: Pain Managment: Goal: General experience of comfort will improve and/or be controlled Outcome: Progressing   Problem: Safety: Goal: Ability to remain free from injury will improve Outcome: Progressing   Problem: Skin Integrity: Goal: Risk for impaired skin integrity will decrease Outcome: Progressing

## 2024-02-01 NOTE — Plan of Care (Signed)
  Problem: Clinical Measurements: Goal: Will remain free from infection Outcome: Progressing Goal: Diagnostic test results will improve Outcome: Progressing Goal: Respiratory complications will improve Outcome: Progressing Goal: Cardiovascular complication will be avoided Outcome: Progressing   Problem: Activity: Goal: Risk for activity intolerance will decrease Outcome: Progressing   Problem: Pain Managment: Goal: General experience of comfort will improve and/or be controlled Outcome: Progressing   Problem: Safety: Goal: Ability to remain free from injury will improve Outcome: Progressing   Problem: Nutrition: Goal: Adequate nutrition will be maintained Outcome: Not Progressing  Needs tube feeding ordered if remaining intubated.

## 2024-02-01 NOTE — Progress Notes (Signed)
 Patient ID: Jason Rojas, male   DOB: 11-04-1960, 63 y.o.   MRN: [id number] Follow up - Trauma Critical Care   Patient Details:    Jason Rojas is an 63 y.o. male.  Lines/tubes : External Urinary Catheter (Active)  Securement Method None needed 01/30/24 0800  Site Assessment Clean, Dry, Intact 01/30/24 0800  Intervention No interventions needed at this time 01/30/24 0800  Output (mL) 425 mL 01/29/24 1800    Microbiology/Sepsis markers: Results for orders placed or performed during the hospital encounter of 01/24/24  MRSA Next Gen by PCR, Nasal     Status: None   Collection Time: 01/24/24  6:25 PM   Specimen: Nasal Mucosa; Nasal Swab  Result Value Ref Range Status   MRSA by PCR Next Gen NOT DETECTED NOT DETECTED Final    Comment: (NOTE) The GeneXpert MRSA Assay (FDA approved for NASAL specimens only), is one component of a comprehensive MRSA colonization surveillance program. It is not intended to diagnose MRSA infection nor to guide or monitor treatment for MRSA infections. Test performance is not FDA approved in patients less than 56 years old. Performed at [facility], [address] [place], [place] [address]     Anti-infectives:  Anti-infectives (From admission, onward)    Start     Dose/Rate Route Frequency Ordered Stop   01/24/24 0900  ceFAZolin  (ANCEF ) IVPB 2g/100 mL premix        2 g 200 mL/hr over 30 Minutes Intravenous STAT 01/24/24 0854 01/24/24 0941      Consults: Treatment Team:  Md, Trauma, MD    Studies:    Events:  Subjective:    Overnight Issues:   Reintubated for hypoxia  Objective:  Vital signs for last 24 hours: Temp:  [96.8 F (36 C)-99.1 F (37.3 C)] 97.6 F (36.4 C) (05/03 0000) Pulse Rate:  [80-108] 80 (05/03 0530) Resp:  [10-25] 18 (05/03 0530) BP: (93-169)/(66-115) 110/68 (05/03 0530) SpO2:  [88 %-100 %] 100 % (05/03 0530) FiO2 (%):  [44 %-100 %] 60 % (05/03 0500)  Hemodynamic parameters for last 24 hours:     Intake/Output from previous day: 05/02 0701 - 05/03 0700 In: 223.7 [I.V.:223.7] Out: 525 [Urine:525]  Intake/Output this shift: Total I/O In: 223.7 [I.V.:223.7] Out: 350 [Urine:350]  Vent settings for last 24 hours: Vent Mode: PRVC FiO2 (%):  [44 %-100 %] 60 % Set Rate:  [18 bmp-22 bmp] 18 bmp Vt Set:  [510 mL] 510 mL PEEP:  [5 cmH20-8 cmH20] 8 cmH20 Plateau Pressure:  [22 cmH20-25 cmH20] 25 cmH20  Physical Exam:  General: no respiratory distress Neuro: does F/C HEENT/Neck: no JVD Resp: some rhonchi, L ribs tender CVS: RRR GI: soft, distended, does not seem tender Extremities: calves soft  Results for orders placed or performed during the hospital encounter of 01/24/24 (from the past 24 hours)  Glucose, capillary     Status: Abnormal   Collection Time: 01/31/24  9:33 PM  Result Value Ref Range   Glucose-Capillary 143 (H) 70 - 99 mg/dL   Comment 1 Notify RN    Comment 2 Document in Chart   I-STAT 7, (LYTES, BLD GAS, ICA, H+H)     Status: Abnormal   Collection Time: 01/31/24 10:51 PM  Result Value Ref Range   pH, Arterial 7.248 (L) 7.35 - 7.45   pCO2 arterial 65.9 (HH) 32 - 48 mmHg   pO2, Arterial 109 (H) 83 - 108 mmHg   Bicarbonate 28.9 (H) 20.0 - 28.0 mmol/L  TCO2 31 22 - 32 mmol/L   O2 Saturation 97 %   Acid-Base Excess 0.0 0.0 - 2.0 mmol/L   Sodium 133 (L) 135 - 145 mmol/L   Potassium 5.3 (H) 3.5 - 5.1 mmol/L   Calcium, Ion 1.24 1.15 - 1.40 mmol/L   HCT 37.0 (L) 39.0 - 52.0 %   Hemoglobin 12.6 (L) 13.0 - 17.0 g/dL   Patient temperature 82.9 F    Sample type ARTERIAL    Comment NOTIFIED PHYSICIAN   Glucose, capillary     Status: Abnormal   Collection Time: 02/01/24 12:04 AM  Result Value Ref Range   Glucose-Capillary 116 (H) 70 - 99 mg/dL  I-STAT 7, (LYTES, BLD GAS, ICA, H+H)     Status: Abnormal   Collection Time: 02/01/24  2:48 AM  Result Value Ref Range   pH, Arterial 7.522 (H) 7.35 - 7.45   pCO2 arterial 29.5 (L) 32 - 48 mmHg   pO2, Arterial 149  (H) 83 - 108 mmHg   Bicarbonate 24.4 20.0 - 28.0 mmol/L   TCO2 25 22 - 32 mmol/L   O2 Saturation 100 %   Acid-Base Excess 2.0 0.0 - 2.0 mmol/L   Sodium 134 (L) 135 - 145 mmol/L   Potassium 4.6 3.5 - 5.1 mmol/L   Calcium, Ion 1.16 1.15 - 1.40 mmol/L   HCT 33.0 (L) 39.0 - 52.0 %   Hemoglobin 11.2 (L) 13.0 - 17.0 g/dL   Patient temperature 56.2 F    Sample type ARTERIAL   Triglycerides     Status: Abnormal   Collection Time: 02/01/24  5:35 AM  Result Value Ref Range   Triglycerides 150 (H) <150 mg/dL    Assessment & Plan: Present on Admission:  Fall    LOS: 8 days   Additional comments:I reviewed the patient's new clinical lab test results. / 63 y/o M s/p Fall on stairs Vent dependent respiratory failure, reintubated overnight 5/2; no pneumothorax on intubation x-ray but infiltrates on the left versus atelectasis and bedside RN notes thick tan secretions from ET tube.  Will check respiratory cultures, wean vent as tolerated L Rib FX 3-10 - multimodal pain control, IS/Pulm toilet L hemopneumothorax - CXR - 5-10% ptx on left, supplemental O2 via nasal cannunla; repeat film 4/26 PTX resolved, multimodal pain control L pulmonary contusion COPD/acute hypoxic respiratory failure - PRN albuterol  nebs, 4L, seems to be stabilizing HTN - home norvasc  ordered, also takes coreg  Hx Prostate CA - [id number]; bone met on R 10th rib; completed radiation therapy. On ADT w/ Zytiga/prednisone  daily. 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  Abdomen is distended, looks gaseous on x-ray, minimal OG output, continue to low intermittent wall suction VTE - SCD's, LMWH ID - None  Dispo - ICU Critical Care Total Time*: 32 Minutes  Jason Acton  MD, MPH, FACS Trauma & General Surgery Use [email or web address] to contact on call provider  02/01/2024  *Care during the described time interval was provided by me.  I have reviewed this patient's available data, including medical history, events of note, physical examination and test results as part of my evaluation.

## 2024-02-02 ENCOUNTER — Inpatient Hospital Stay (HOSPITAL_COMMUNITY)

## 2024-02-02 LAB — BASIC METABOLIC PANEL WITH GFR
Anion gap: 15 (ref 5–15)
BUN: 31 mg/dL — ABNORMAL HIGH (ref 8–23)
CO2: 24 mmol/L (ref 22–32)
Calcium: 8.8 mg/dL — ABNORMAL LOW (ref 8.9–10.3)
Chloride: 99 mmol/L (ref 98–111)
Creatinine, Ser: 0.84 mg/dL (ref 0.61–1.24)
GFR, Estimated: >60 mL/min (ref >60)
Glucose, Bld: 124 mg/dL — ABNORMAL HIGH (ref 70–99)
Potassium: 3.9 mmol/L (ref 3.5–5.1)
Sodium: 138 mmol/L (ref 135–145)

## 2024-02-02 LAB — CBC
HCT: 35 % — ABNORMAL LOW (ref 39.0–52.0)
Hemoglobin: 11.3 g/dL — ABNORMAL LOW (ref 13.0–17.0)
MCH: 27.1 pg (ref 26.0–34.0)
MCHC: 32.3 g/dL (ref 30.0–36.0)
MCV: 83.9 fL (ref 80.0–100.0)
Platelets: 334 10*3/uL (ref 150–400)
RBC: 4.17 MIL/uL — ABNORMAL LOW (ref 4.22–5.81)
RDW: 13.9 % (ref 11.5–15.5)
WBC: 8.8 10*3/uL (ref 4.0–10.5)
nRBC: 0 % (ref 0.0–0.2)

## 2024-02-02 LAB — GLUCOSE, CAPILLARY
Glucose-Capillary: 105 mg/dL — ABNORMAL HIGH (ref 70–99)
Glucose-Capillary: 109 mg/dL — ABNORMAL HIGH (ref 70–99)
Glucose-Capillary: 109 mg/dL — ABNORMAL HIGH (ref 70–99)
Glucose-Capillary: 111 mg/dL — ABNORMAL HIGH (ref 70–99)
Glucose-Capillary: 119 mg/dL — ABNORMAL HIGH (ref 70–99)
Glucose-Capillary: 121 mg/dL — ABNORMAL HIGH (ref 70–99)
Glucose-Capillary: 95 mg/dL (ref 70–99)

## 2024-02-02 LAB — MAGNESIUM: Magnesium: 2.8 mg/dL — ABNORMAL HIGH (ref 1.7–2.4)

## 2024-02-02 LAB — PHOSPHORUS: Phosphorus: 3.4 mg/dL (ref 2.5–4.6)

## 2024-02-02 MED ORDER — CHLORHEXIDINE GLUCONATE CLOTH 2 % EX PADS
6.0000 | MEDICATED_PAD | Freq: Every day | CUTANEOUS | Status: DC
Start: 2024-02-02 — End: 2024-02-21
  Administered 2024-02-02 – 2024-02-21 (×18): 6 via TOPICAL

## 2024-02-02 MED ORDER — SODIUM CHLORIDE 0.9 % IV SOLN
2.0000 g | Freq: Three times a day (TID) | INTRAVENOUS | Status: DC
  Administered 2024-02-02 – 2024-02-03 (×3): 2 g via INTRAVENOUS
  Filled 2024-02-02 (×3): qty 12.5

## 2024-02-02 MED ORDER — BISACODYL 10 MG RE SUPP
10.0000 mg | Freq: Every day | RECTAL | Status: DC | PRN
  Administered 2024-02-02 – 2024-02-14 (×2): 10 mg via RECTAL
  Filled 2024-02-02 (×2): qty 1

## 2024-02-02 NOTE — Progress Notes (Signed)
 Trauma physician to bedside to evaluate pt.  Requested OGT to be placed back to LIWS.  This has been completed.  Awaiting physician orders.

## 2024-02-02 NOTE — Plan of Care (Signed)
  Problem: Clinical Measurements: Goal: Ability to maintain clinical measurements within normal limits will improve Outcome: Progressing Goal: Diagnostic test results will improve Outcome: Progressing   

## 2024-02-02 NOTE — Progress Notes (Signed)
 Patient ID: ODIS BORCHARD, male   DOB: 1960/10/16, 63 y.o.   MRN: [id number] Follow up - Trauma Critical Care   Patient Details:    BREY PEERSON is an 63 y.o. male.  Lines/tubes : External Urinary Catheter (Active)  Securement Method None needed 01/30/24 0800  Site Assessment Clean, Dry, Intact 01/30/24 0800  Intervention No interventions needed at this time 01/30/24 0800  Output (mL) 425 mL 01/29/24 1800    Microbiology/Sepsis markers: Results for orders placed or performed during the hospital encounter of 01/24/24  MRSA Next Gen by PCR, Nasal     Status: None   Collection Time: 01/24/24  6:25 PM   Specimen: Nasal Mucosa; Nasal Swab  Result Value Ref Range Status   MRSA by PCR Next Gen NOT DETECTED NOT DETECTED Final    Comment: (NOTE) The GeneXpert MRSA Assay (FDA approved for NASAL specimens only), is one component of a comprehensive MRSA colonization surveillance program. It is not intended to diagnose MRSA infection nor to guide or monitor treatment for MRSA infections. Test performance is not FDA approved in patients less than 42 years old. Performed at [facility], [address], [place], [place] [address]   Culture, Respiratory w Gram Stain     Status: None (Preliminary result)   Collection Time: 02/01/24  6:32 AM   Specimen: Tracheal Aspirate; Respiratory  Result Value Ref Range Status   Specimen Description TRACHEAL ASPIRATE  Final   Special Requests NONE  Final   Gram Stain   Final    RARE SQUAMOUS EPITHELIAL CELLS PRESENT WBC PRESENT, PREDOMINANTLY PMN ABUNDANT GRAM POSITIVE COCCI IN PAIRS IN CHAINS IN CLUSTERS Performed at [facility], [address] [place], [place] [address]    Culture PENDING  Incomplete   Report Status PENDING  Incomplete    Anti-infectives:  Anti-infectives (From admission, onward)    Start     Dose/Rate Route Frequency Ordered Stop   01/24/24 0900  ceFAZolin  (ANCEF ) IVPB 2g/100 mL premix        2 g 200 mL/hr over  30 Minutes Intravenous STAT 01/24/24 0854 01/24/24 0941      Consults: Treatment Team:  Md, Trauma, MD    Studies:    Events:  Subjective:    Overnight Issues:   Reintubated 5/2 pm for hypoxia No acute issues overnight  Objective:  Vital signs for last 24 hours: Temp:  [97.6 F (36.4 C)-97.9 F (36.6 C)] 97.6 F (36.4 C) (05/04 0752) Pulse Rate:  [76-88] 83 (05/04 0430) Resp:  [18-22] 18 (05/04 0630) BP: (113-180)/(65-132) 138/83 (05/04 0630) SpO2:  [96 %-100 %] 100 % (05/04 0430) FiO2 (%):  [50 %] 50 % (05/04 0600)  Hemodynamic parameters for last 24 hours:    Intake/Output from previous day: 05/03 0701 - 05/04 0700 In: 322.6 [I.V.:232.6; NG/GT:90] Out: 475 [Urine:375; Emesis/NG output:100]  Intake/Output this shift: No intake/output data recorded.  Vent settings for last 24 hours: Vent Mode: PRVC FiO2 (%):  [50 %] 50 % Set Rate:  [18 bmp] 18 bmp Vt Set:  [510 mL] 510 mL PEEP:  [8 cmH20] 8 cmH20 Plateau Pressure:  [24 cmH20-25 cmH20] 24 cmH20  Physical Exam:  General: no respiratory distress Neuro: does F/C HEENT/Neck: no JVD Resp: some rhonchi, L ribs tender CVS: RRR GI: soft, distended, does not seem tender Extremities: calves soft  Results for orders placed or performed during the hospital encounter of 01/24/24 (from the past 24 hours)  Glucose, capillary  Status: Abnormal   Collection Time: 02/01/24  8:03 AM  Result Value Ref Range   Glucose-Capillary 121 (H) 70 - 99 mg/dL  CBC     Status: Abnormal   Collection Time: 02/01/24 10:00 AM  Result Value Ref Range   WBC 10.8 (H) 4.0 - 10.5 K/uL   RBC 4.21 (L) 4.22 - 5.81 MIL/uL   Hemoglobin 11.6 (L) 13.0 - 17.0 g/dL   HCT 16.1 (L) 09.6 - 04.5 %   MCV 85.3 80.0 - 100.0 fL   MCH 27.6 26.0 - 34.0 pg   MCHC 32.3 30.0 - 36.0 g/dL   RDW 40.9 81.1 - 91.4 %   Platelets 371 150 - 400 K/uL   nRBC 0.0 0.0 - 0.2 %  Basic metabolic panel     Status: Abnormal   Collection Time: 02/01/24 10:00 AM   Result Value Ref Range   Sodium 133 (L) 135 - 145 mmol/L   Potassium 4.2 3.5 - 5.1 mmol/L   Chloride 93 (L) 98 - 111 mmol/L   CO2 26 22 - 32 mmol/L   Glucose, Bld 107 (H) 70 - 99 mg/dL   BUN 29 (H) 8 - 23 mg/dL   Creatinine, Ser 7.82 0.61 - 1.24 mg/dL   Calcium 9.1 8.9 - 95.6 mg/dL   GFR, Estimated >21 >30 mL/min   Anion gap 14 5 - 15  Glucose, capillary     Status: Abnormal   Collection Time: 02/01/24 11:11 AM  Result Value Ref Range   Glucose-Capillary 109 (H) 70 - 99 mg/dL  Glucose, capillary     Status: Abnormal   Collection Time: 02/01/24  4:13 PM  Result Value Ref Range   Glucose-Capillary 101 (H) 70 - 99 mg/dL  Glucose, capillary     Status: Abnormal   Collection Time: 02/01/24  8:11 PM  Result Value Ref Range   Glucose-Capillary 105 (H) 70 - 99 mg/dL  Glucose, capillary     Status: Abnormal   Collection Time: 02/02/24 12:10 AM  Result Value Ref Range   Glucose-Capillary 109 (H) 70 - 99 mg/dL  Basic metabolic panel with GFR     Status: Abnormal   Collection Time: 02/02/24  3:28 AM  Result Value Ref Range   Sodium 138 135 - 145 mmol/L   Potassium 3.9 3.5 - 5.1 mmol/L   Chloride 99 98 - 111 mmol/L   CO2 24 22 - 32 mmol/L   Glucose, Bld 124 (H) 70 - 99 mg/dL   BUN 31 (H) 8 - 23 mg/dL   Creatinine, Ser 8.65 0.61 - 1.24 mg/dL   Calcium 8.8 (L) 8.9 - 10.3 mg/dL   GFR, Estimated >78 >46 mL/min   Anion gap 15 5 - 15  CBC     Status: Abnormal   Collection Time: 02/02/24  3:28 AM  Result Value Ref Range   WBC 8.8 4.0 - 10.5 K/uL   RBC 4.17 (L) 4.22 - 5.81 MIL/uL   Hemoglobin 11.3 (L) 13.0 - 17.0 g/dL   HCT 96.2 (L) 95.2 - 84.1 %   MCV 83.9 80.0 - 100.0 fL   MCH 27.1 26.0 - 34.0 pg   MCHC 32.3 30.0 - 36.0 g/dL   RDW 32.4 40.1 - 02.7 %   Platelets 334 150 - 400 K/uL   nRBC 0.0 0.0 - 0.2 %  Magnesium      Status: Abnormal   Collection Time: 02/02/24  3:28 AM  Result Value Ref Range   Magnesium  2.8 (H) 1.7 - 2.4  mg/dL  Phosphorus     Status: None   Collection Time:  02/02/24  3:28 AM  Result Value Ref Range   Phosphorus 3.4 2.5 - 4.6 mg/dL  Glucose, capillary     Status: Abnormal   Collection Time: 02/02/24  4:21 AM  Result Value Ref Range   Glucose-Capillary 111 (H) 70 - 99 mg/dL  Glucose, capillary     Status: Abnormal   Collection Time: 02/02/24  7:51 AM  Result Value Ref Range   Glucose-Capillary 119 (H) 70 - 99 mg/dL    Assessment & Plan: Present on Admission:  Fall    LOS: 9 days   Additional comments:I reviewed the patient's new clinical lab test results. / 63 y/o M s/p Fall on stairs Vent dependent respiratory failure, reintubated overnight 5/2; no pneumothorax on intubation x-ray but infiltrates on the left versus atelectasis and bedside RN notes thick tan secretions from ET tube.  Respiratory cultures pending, Gram stain with gram-positive cocci. L Rib FX 3-10 - multimodal pain control, IS/Pulm toilet L hemopneumothorax - CXR - 5-10% ptx on left, supplemental O2 via nasal cannunla; repeat film 4/26 PTX resolved, multimodal pain control L pulmonary contusion COPD/acute hypoxic respiratory failure - PRN albuterol  nebs, back on vent as above HTN - home norvasc  ordered, also takes coreg  Hx Prostate CA - [id number]; bone met on R 10th rib; completed radiation therapy. On ADT w/ Zytiga/prednisone  daily. 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  Abdomen is distended, looks gaseous on x-ray, minimal OG output, continue to low intermittent wall suction VTE - SCD's, LMWH ID -initiate cefepime 5/4 for suspected HCAP Dispo - ICU Critical Care Total Time*: 34 Minutes  [name]  MD, MPH, FACS Trauma & General Surgery Use [email or web address] to contact on call provider  02/02/2024  *Care during the described time interval was provided by me. I have reviewed this patient's available data, including medical history, events of note, physical  examination and test results as part of my evaluation.

## 2024-02-03 ENCOUNTER — Inpatient Hospital Stay (HOSPITAL_COMMUNITY)

## 2024-02-03 ENCOUNTER — Other Ambulatory Visit: Payer: Self-pay

## 2024-02-03 LAB — CULTURE, RESPIRATORY W GRAM STAIN

## 2024-02-03 LAB — BASIC METABOLIC PANEL WITH GFR
Anion gap: 8 (ref 5–15)
BUN: 30 mg/dL — ABNORMAL HIGH (ref 8–23)
CO2: 27 mmol/L (ref 22–32)
Calcium: 8.7 mg/dL — ABNORMAL LOW (ref 8.9–10.3)
Chloride: 106 mmol/L (ref 98–111)
Creatinine, Ser: 0.67 mg/dL (ref 0.61–1.24)
GFR, Estimated: >60 mL/min (ref >60)
Glucose, Bld: 115 mg/dL — ABNORMAL HIGH (ref 70–99)
Potassium: 3.5 mmol/L (ref 3.5–5.1)
Sodium: 141 mmol/L (ref 135–145)

## 2024-02-03 LAB — CBC
HCT: 32 % — ABNORMAL LOW (ref 39.0–52.0)
Hemoglobin: 10.3 g/dL — ABNORMAL LOW (ref 13.0–17.0)
MCH: 27.3 pg (ref 26.0–34.0)
MCHC: 32.2 g/dL (ref 30.0–36.0)
MCV: 84.9 fL (ref 80.0–100.0)
Platelets: 374 10*3/uL (ref 150–400)
RBC: 3.77 MIL/uL — ABNORMAL LOW (ref 4.22–5.81)
RDW: 14 % (ref 11.5–15.5)
WBC: 8.9 10*3/uL (ref 4.0–10.5)
nRBC: 0 % (ref 0.0–0.2)

## 2024-02-03 LAB — GLUCOSE, CAPILLARY
Glucose-Capillary: 101 mg/dL — ABNORMAL HIGH (ref 70–99)
Glucose-Capillary: 104 mg/dL — ABNORMAL HIGH (ref 70–99)
Glucose-Capillary: 109 mg/dL — ABNORMAL HIGH (ref 70–99)
Glucose-Capillary: 125 mg/dL — ABNORMAL HIGH (ref 70–99)
Glucose-Capillary: 95 mg/dL (ref 70–99)
Glucose-Capillary: 97 mg/dL (ref 70–99)

## 2024-02-03 LAB — MAGNESIUM: Magnesium: 2.6 mg/dL — ABNORMAL HIGH (ref 1.7–2.4)

## 2024-02-03 MED ORDER — SODIUM CHLORIDE 0.9% FLUSH
10.0000 mL | Freq: Two times a day (BID) | INTRAVENOUS | Status: DC
  Administered 2024-02-03 – 2024-02-05 (×4): 10 mL
  Administered 2024-02-05: 30 mL
  Administered 2024-02-06 – 2024-02-07 (×3): 10 mL
  Administered 2024-02-07: 30 mL
  Administered 2024-02-08 – 2024-02-15 (×12): 10 mL
  Administered 2024-02-15 – 2024-02-16 (×2): 40 mL
  Administered 2024-02-16 – 2024-02-20 (×5): 10 mL
  Administered 2024-02-20: 30 mL
  Administered 2024-02-21: 10 mL

## 2024-02-03 MED ORDER — INSULIN ASPART 100 UNIT/ML IJ SOLN
0.0000 [IU] | INTRAMUSCULAR | Status: DC
  Administered 2024-02-05: 1 [IU] via SUBCUTANEOUS

## 2024-02-03 MED ORDER — DEXMEDETOMIDINE HCL IN NACL 400 MCG/100ML IV SOLN
0.0000 ug/kg/h | INTRAVENOUS | Status: DC
  Administered 2024-02-03: 0.3 ug/kg/h via INTRAVENOUS
  Administered 2024-02-03: 0.4 ug/kg/h via INTRAVENOUS
  Administered 2024-02-04: 0.6 ug/kg/h via INTRAVENOUS
  Administered 2024-02-04: 0.3 ug/kg/h via INTRAVENOUS
  Administered 2024-02-05 – 2024-02-06 (×8): 0.7 ug/kg/h via INTRAVENOUS
  Administered 2024-02-07: 0.8 ug/kg/h via INTRAVENOUS
  Administered 2024-02-07: 0.7 ug/kg/h via INTRAVENOUS
  Administered 2024-02-07: 0.8 ug/kg/h via INTRAVENOUS
  Administered 2024-02-07: 0.7 ug/kg/h via INTRAVENOUS
  Administered 2024-02-08 (×2): 0.9 ug/kg/h via INTRAVENOUS
  Administered 2024-02-08: 1.2 ug/kg/h via INTRAVENOUS
  Administered 2024-02-08: 0.8 ug/kg/h via INTRAVENOUS
  Administered 2024-02-08: 0.9 ug/kg/h via INTRAVENOUS
  Administered 2024-02-09 (×2): 1 ug/kg/h via INTRAVENOUS
  Administered 2024-02-09: 1.2 ug/kg/h via INTRAVENOUS
  Administered 2024-02-09 – 2024-02-10 (×5): 1 ug/kg/h via INTRAVENOUS
  Administered 2024-02-10: .4 ug/kg/h via INTRAVENOUS
  Administered 2024-02-10 – 2024-02-11 (×4): 1 ug/kg/h via INTRAVENOUS
  Administered 2024-02-11: 1.2 ug/kg/h via INTRAVENOUS
  Administered 2024-02-12: 0.9 ug/kg/h via INTRAVENOUS
  Administered 2024-02-12: 0.8 ug/kg/h via INTRAVENOUS
  Administered 2024-02-12: 0.7 ug/kg/h via INTRAVENOUS
  Administered 2024-02-12: 0.8 ug/kg/h via INTRAVENOUS
  Administered 2024-02-13: 1.2 ug/kg/h via INTRAVENOUS
  Administered 2024-02-13: 1 ug/kg/h via INTRAVENOUS
  Administered 2024-02-13: 0.8 ug/kg/h via INTRAVENOUS
  Administered 2024-02-13: 1 ug/kg/h via INTRAVENOUS
  Administered 2024-02-13: 0.9 ug/kg/h via INTRAVENOUS
  Administered 2024-02-13 – 2024-02-14 (×4): 1.2 ug/kg/h via INTRAVENOUS
  Filled 2024-02-03 (×15): qty 100
  Filled 2024-02-03: qty 200
  Filled 2024-02-03: qty 100
  Filled 2024-02-03: qty 200
  Filled 2024-02-03 (×7): qty 100
  Filled 2024-02-03: qty 300
  Filled 2024-02-03 (×11): qty 100
  Filled 2024-02-03: qty 200
  Filled 2024-02-03 (×7): qty 100

## 2024-02-03 MED ORDER — ALBUMIN HUMAN 5 % IV SOLN
12.5000 g | Freq: Once | INTRAVENOUS | Status: AC
  Administered 2024-02-03: 12.5 g via INTRAVENOUS
  Filled 2024-02-03: qty 250

## 2024-02-03 MED ORDER — THIAMINE HCL 100 MG/ML IJ SOLN
100.0000 mg | Freq: Every day | INTRAMUSCULAR | Status: AC
  Administered 2024-02-03 – 2024-02-09 (×7): 100 mg via INTRAVENOUS
  Filled 2024-02-03 (×7): qty 2

## 2024-02-03 MED ORDER — QUETIAPINE FUMARATE 50 MG PO TABS
50.0000 mg | ORAL_TABLET | Freq: Two times a day (BID) | ORAL | Status: DC
  Administered 2024-02-03 – 2024-02-10 (×16): 50 mg
  Filled 2024-02-03 (×16): qty 1

## 2024-02-03 MED ORDER — CLONAZEPAM 0.5 MG PO TABS
0.5000 mg | ORAL_TABLET | Freq: Two times a day (BID) | ORAL | Status: DC
  Administered 2024-02-03 – 2024-02-10 (×16): 0.5 mg
  Filled 2024-02-03 (×16): qty 1

## 2024-02-03 MED ORDER — POTASSIUM CHLORIDE 10 MEQ/100ML IV SOLN
10.0000 meq | INTRAVENOUS | Status: AC
  Administered 2024-02-03 (×4): 10 meq via INTRAVENOUS
  Filled 2024-02-03: qty 100

## 2024-02-03 MED ORDER — SODIUM CHLORIDE 0.9% FLUSH: 10.0000 mL | INTRAVENOUS | Status: DC | PRN

## 2024-02-03 MED ORDER — SODIUM CHLORIDE 0.9 % IV SOLN
2.0000 g | INTRAVENOUS | Status: AC
  Administered 2024-02-03 – 2024-02-08 (×6): 2 g via INTRAVENOUS
  Filled 2024-02-03 (×6): qty 20

## 2024-02-03 MED ORDER — TRAVASOL 10 % IV SOLN
INTRAVENOUS | Status: AC
  Filled 2024-02-03: qty 528

## 2024-02-03 NOTE — Progress Notes (Signed)
 Initial Nutrition Assessment  DOCUMENTATION CODES:  Not applicable  INTERVENTION:  TPN initiation; management per pharmacy Monitor magnesium  and phosphorus daily x 4 occurrences, MD to replete as needed, as pt is at risk for refeeding syndrome Continue folvite , MVI and thiamine  in setting of ETOH use  NUTRITION DIAGNOSIS:  Inadequate oral intake related to acute illness, altered GI function as evidenced by NPO status.  GOAL:  Patient will meet greater than or equal to 90% of their needs  MONITOR:  Labs, Weight trends, Vent status  REASON FOR ASSESSMENT:  Consult, Ventilator New TPN/TNA  ASSESSMENT:  Pt admitted after a fall on stairs leading to L rib fractures 3-10, hemopneumothorax and pulmonary contusion. PMH significant for COPD, HTN, prostate cancer and ETOH use.   5/2: re-intubated d/t hypoxia and more obtunded 5/5: abdominal xray- findings suggest ileus  Patient is currently intubated on ventilator support MV: 9.4 L/min Temp (24hrs), Avg:98.4 F (36.9 C), Min:97.7 F (36.5 C), Max:98.9 F (37.2 C)  Assessed pt at bedside. Pt's mom, aunt and family friend present but leaving, therefore did not provide any nutrition related history.   Pt discussed in IDT rounds.  Pt reported to consume 15 beers daily PTA.   Limited weight history on file to review. Admission weight is likely stated/pulled forward from prior documentation. No updated weight since 10/14/23. Discussed with RN who obtained bed weight without blankets/pillows on bed. Current updated measurement is 87.6 kg.   Drains/lines: OGT TO LIWS-16ml x24 hours  Medications: colace BID, folvite ,SSI 0-6 units q4h,  MVI, miralax  daily, thiamine  daily Drips: Propofol  @ 2.67ml/hr  Labs:  BUN 30 Mg 2.6 CBG's 95-121 x24 hours  NUTRITION - FOCUSED PHYSICAL EXAM: Flowsheet Row Most Recent Value  Orbital Region Unable to assess  Upper Arm Region Moderate depletion  Thoracic and Lumbar Region No depletion  Buccal  Region Unable to assess  Temple Region No depletion  Clavicle Bone Region No depletion  Clavicle and Acromion Bone Region No depletion  Scapular Bone Region Unable to assess  Dorsal Hand Unable to assess  Patellar Region Moderate depletion  Anterior Thigh Region Moderate depletion  Posterior Calf Region Mild depletion  Edema (RD Assessment) None  Hair Reviewed  Eyes Unable to assess  Mouth Unable to assess  Skin Reviewed  Nails Unable to assess   Diet Order:   Diet Order             Diet NPO time specified  Diet effective now                   EDUCATION NEEDS:   No education needs have been identified at this time  Skin:  Skin Assessment: Reviewed RN Assessment  Last BM:  5/4 type 7 x2 medium/large  Height:   Ht Readings from Last 1 Encounters:  01/31/24 5\' 6"  (1.676 m)    Weight:   Wt Readings from Last 1 Encounters:  02/03/24 87.6 kg    Ideal Body Weight:  64.5 kg  BMI:  Body mass index is 31.17 kg/m.  Estimated Nutritional Needs:   Kcal:  1900-2100  Protein:  105-120g  Fluid:  >/=1.9L  Rocklin Chute, RDN, LDN Clinical Nutrition See AMiON for contact information.

## 2024-02-03 NOTE — Progress Notes (Signed)
 PT Cancellation Note  Patient Details Name: PHIL KOZAR MRN: [id number] DOB: August 23, 1961   Cancelled Treatment:    Reason Eval/Treat Not Completed: Patient not medically ready (pt intubated and sedated)   Diann Bangerter B Brynlea Spindler 02/03/2024, 11:34 AM [name], PT Acute Rehabilitation Services Office: [phone]

## 2024-02-03 NOTE — Progress Notes (Signed)
 PHARMACY - TOTAL PARENTERAL NUTRITION CONSULT NOTE   Indication: Prolonged ileus  Patient Measurements: Height: 5\' 6"  (167.6 cm) Weight: 92 kg (202 lb 13.2 oz) IBW/kg (Calculated) : 63.8 TPN AdjBW (KG): 72.6 Body mass index is 32.74 kg/m. Usual Weight: 85-92 kg   Assessment: Patient with history of heavy alcohol  use presented with fall on stairs, acute respiratory failure requiring mechanical ventilation, L rib fractures, L hemopneumothorax, L pulmonary contusion. Patient also developed Ileus, NGT to suction. Minimal nutrition since admission 4/25. Pharmacy consulted to dose TPN.   Glucose / Insulin: CBGs < 180 not requiring insulin, no history of diabetes  Electrolytes: K 3.5, Ca 8.7, Mg 2.6 otherwise WNL  Renal: Scr < 1.0, UOP 0.3 mL/kg/hr  Hepatic: TG 150 5/3, propofol  (72 kcal/day) being transition to dexmedetomidine   Intake / Output; MIVF: Net IO Since Admission: -1,560.19 mL [02/03/24 0847] GI Imaging: 5/4 KUB Findings suggest ileus.  4/25 CT Chest/Ab/Pelv Stomach/Bowel: Unremarkable GI Surgeries / Procedures: n/a   Central access: PICC Line ordered 5/5  TPN start date: 5/5   Nutritional Goals: Goal TPN rate is 80 mL/hr (provides 105 g of protein and 1997 kcals per day) Will use 10% dextrose  today to keep under 100 g dextrose  given refeeding risk (increase to 15% as tolerated)   RD Assessment:  1800-2000 kcal  90-105g protein   Current Nutrition:  NPO  Plan:  Start TPN at 40 mL/hr at 1800 Electrolytes in TPN: Na 50mEq/L, K 50mEq/L, Ca 66mEq/L, Mg 10 mEq/L, and Phos 20 mmol/L. Cl:Ac 1:1 Add standard MVI and trace elements to TPN Initiate very Sensitive q4h SSI and adjust as needed  Monitor TPN labs on Mon/Thurs and tomorrow given new start  40 mEq IV KCL today  Thiamine  100 mg IV daily x7 days   Jason Rojas 02/03/2024,8:36 AM

## 2024-02-03 NOTE — TOC Progression Note (Signed)
 Transition of Care [facility]) - Progression Note    Patient Details  Name: Jason Rojas MRN: [id number] Date of Birth: 1960-10-13  [facility] CM/SW Contact  Breda Bond E Charlton Boule, LCSW Phone Number: 02/03/2024, 9:14 AM  Clinical Narrative:    [facility] ([facility] and [facility]) both state they may be able to accept patient for STR when medically ready.   Expected Discharge Plan: Skilled Nursing Facility Barriers to Discharge: Continued Medical Work up  Expected Discharge Plan and Services       Living arrangements for the past 2 months: Single Family Home                                       Social Determinants of Health (SDOH) Interventions SDOH Screenings   Food Insecurity: No Food Insecurity (01/25/2024)  Housing: Low Risk  (01/25/2024)  Transportation Needs: No Transportation Needs (01/27/2024)  Utilities: Not At Risk (01/27/2024)  Tobacco Use: High Risk (01/24/2024)    Readmission Risk Interventions    01/28/2024   12:04 PM  Readmission Risk Prevention Plan  Transportation Screening Complete  PCP or Specialist Appt within 3-5 Days Complete  HRI or Home Care Consult Complete  Social Work Consult for Recovery Care Planning/Counseling Complete  Palliative Care Screening Not Applicable  Medication Review Oceanographer) Complete

## 2024-02-03 NOTE — Progress Notes (Signed)
 Patient ID: Jason Rojas, male   DOB: 11-09-1960, 63 y.o.   MRN: [id number] Follow up - Trauma Critical Care   Patient Details:    Jason Rojas is an 63 y.o. male.  Lines/tubes : Airway 7.5 mm (Active)  Secured at (cm) 23 cm 02/03/24 0400  Measured From Lips 02/03/24 0400  Secured Location Center 02/03/24 0200  Secured By [employer] 02/03/24 0400  Bite Block No 02/03/24 0200  Tube Holder Repositioned Yes 02/03/24 0200  Prone position No 02/02/24 1959  Cuff Pressure (cm H2O) Clear OR 27-39 CmH2O 02/03/24 0200  Site Condition Dry 02/03/24 0400     NG/OG Vented/Dual Lumen Oral (Active)  Tube Position (Required) External length of tube 02/03/24 0400  Ongoing Placement Verification (Required) (See row information) Yes 02/03/24 0400  Site Assessment Clean, Dry, Intact 02/03/24 0400  Status Low intermittent suction 02/03/24 0400  Drainage Appearance Yellow 02/03/24 0400  Intake (mL) 100 mL 02/02/24 0918  Output (mL) 150 mL 02/03/24 0600     Urethral Catheter Jason Pugh Rn Latex 16 Fr. (Active)  Indication for Insertion or Continuance of Catheter Acute urinary retention (I&O Cath for 24 hrs prior to catheter insertion- Inpatient Only) 02/03/24 0400  Site Assessment Clean, Dry, Intact 02/03/24 0400  Catheter Maintenance Bag below level of bladder;Catheter secured;Drainage bag/tubing not touching floor;Insertion date on drainage bag;No dependent loops;Seal intact 02/03/24 0400  Collection Container Standard drainage bag 02/03/24 0400  Securement Method Adhesive securement device 02/03/24 0400  Urinary Catheter Interventions (if applicable) Unclamped 02/03/24 0400  Output (mL) 30 mL 02/03/24 0600    Microbiology/Sepsis markers: Results for orders placed or performed during the hospital encounter of 01/24/24  MRSA Next Gen by PCR, Nasal     Status: None   Collection Time: 01/24/24  6:25 PM   Specimen: Nasal Mucosa; Nasal Swab  Result Value Ref Range Status   MRSA by PCR  Next Gen NOT DETECTED NOT DETECTED Final    Comment: (NOTE) The GeneXpert MRSA Assay (FDA approved for NASAL specimens only), is one component of a comprehensive MRSA colonization surveillance program. It is not intended to diagnose MRSA infection nor to guide or monitor treatment for MRSA infections. Test performance is not FDA approved in patients less than 90 years old. Performed at [facility], [address], [place], [place] [address]   Culture, Respiratory w Gram Stain     Status: Abnormal (Preliminary result)   Collection Time: 02/01/24  6:32 AM   Specimen: Tracheal Aspirate; Respiratory  Result Value Ref Range Status   Specimen Description TRACHEAL ASPIRATE  Final   Special Requests NONE  Final   Gram Stain   Final    RARE SQUAMOUS EPITHELIAL CELLS PRESENT WBC PRESENT, PREDOMINANTLY PMN ABUNDANT GRAM POSITIVE COCCI IN PAIRS IN CHAINS IN CLUSTERS    Culture (A)  Final    STREPTOCOCCUS PNEUMONIAE SUSCEPTIBILITIES TO FOLLOW Performed at [facility], [address] [place], [place] [address]    Report Status PENDING  Incomplete    Anti-infectives:  Anti-infectives (From admission, onward)    Start     Dose/Rate Route Frequency Ordered Stop   02/02/24 0930  ceFEPIme (MAXIPIME) 2 g in sodium chloride  0.9 % 100 mL IVPB        2 g 200 mL/hr over 30 Minutes Intravenous Every 8 hours 02/02/24 0835     01/24/24 0900  ceFAZolin  (ANCEF ) IVPB 2g/100 mL premix        2 g  200 mL/hr over 30 Minutes Intravenous STAT 01/24/24 0854 01/24/24 0941      Consults: Treatment Team:  Md, Trauma, MD    Studies:    Events:  Subjective:    Overnight Issues: stable, NGT to LIWS  Objective:  Vital signs for last 24 hours: Temp:  [97.7 F (36.5 C)-98.9 F (37.2 C)] 97.7 F (36.5 C) (05/05 0743) Pulse Rate:  [68-92] 77 (05/05 0813) Resp:  [14-19] 18 (05/05 0813) BP: (105-163)/(68-118) 122/68 (05/05 0600) SpO2:  [94 %-100 %] 100 % (05/05 0813) FiO2 (%):  [40 %]  40 % (05/05 0813)  Hemodynamic parameters for last 24 hours:    Intake/Output from previous day: 05/04 0701 - 05/05 0700 In: 663.1 [I.V.:262.9; NG/GT:100; IV Piggyback:300.2] Out: 805 [Urine:655; Emesis/NG output:150]  Intake/Output this shift: No intake/output data recorded.  Vent settings for last 24 hours: Vent Mode: PRVC FiO2 (%):  [40 %] 40 % Set Rate:  [18 bmp] 18 bmp Vt Set:  [510 mL] 510 mL PEEP:  [5 cmH20-8 cmH20] 5 cmH20 Pressure Support:  [10 cmH20] 10 cmH20 Plateau Pressure:  [20 cmH20-23 cmH20] 20 cmH20  Physical Exam:  General: sedated on vent Neuro: arouses HEENT/Neck: ETT Resp: few L rhonchi, L ribs tender CVS: RRR GI: distended but soft, tympani Extremities: edema 1+  Results for orders placed or performed during the hospital encounter of 01/24/24 (from the past 24 hours)  Glucose, capillary     Status: Abnormal   Collection Time: 02/02/24 10:59 AM  Result Value Ref Range   Glucose-Capillary 121 (H) 70 - 99 mg/dL  Glucose, capillary     Status: Abnormal   Collection Time: 02/02/24  3:29 PM  Result Value Ref Range   Glucose-Capillary 109 (H) 70 - 99 mg/dL  Glucose, capillary     Status: Abnormal   Collection Time: 02/02/24  7:59 PM  Result Value Ref Range   Glucose-Capillary 105 (H) 70 - 99 mg/dL  Glucose, capillary     Status: None   Collection Time: 02/02/24 11:43 PM  Result Value Ref Range   Glucose-Capillary 95 70 - 99 mg/dL  Glucose, capillary     Status: None   Collection Time: 02/03/24  3:42 AM  Result Value Ref Range   Glucose-Capillary 95 70 - 99 mg/dL  CBC     Status: Abnormal   Collection Time: 02/03/24  4:30 AM  Result Value Ref Range   WBC 8.9 4.0 - 10.5 K/uL   RBC 3.77 (L) 4.22 - 5.81 MIL/uL   Hemoglobin 10.3 (L) 13.0 - 17.0 g/dL   HCT 40.3 (L) 47.4 - 25.9 %   MCV 84.9 80.0 - 100.0 fL   MCH 27.3 26.0 - 34.0 pg   MCHC 32.2 30.0 - 36.0 g/dL   RDW 56.3 87.5 - 64.3 %   Platelets 374 150 - 400 K/uL   nRBC 0.0 0.0 - 0.2 %  Basic  metabolic panel with GFR     Status: Abnormal   Collection Time: 02/03/24  4:30 AM  Result Value Ref Range   Sodium 141 135 - 145 mmol/L   Potassium 3.5 3.5 - 5.1 mmol/L   Chloride 106 98 - 111 mmol/L   CO2 27 22 - 32 mmol/L   Glucose, Bld 115 (H) 70 - 99 mg/dL   BUN 30 (H) 8 - 23 mg/dL   Creatinine, Ser 3.29 0.61 - 1.24 mg/dL   Calcium 8.7 (L) 8.9 - 10.3 mg/dL   GFR, Estimated >51 >88 mL/min  Anion gap 8 5 - 15  Magnesium      Status: Abnormal   Collection Time: 02/03/24  4:30 AM  Result Value Ref Range   Magnesium  2.6 (H) 1.7 - 2.4 mg/dL  Glucose, capillary     Status: None   Collection Time: 02/03/24  7:41 AM  Result Value Ref Range   Glucose-Capillary 97 70 - 99 mg/dL    Assessment & Plan: Present on Admission:  Fall    LOS: 10 days   Additional comments:I reviewed the patient's new clinical lab test results. / 63 y/o M s/p Fall on stairs Acute hypoxic ventilator dependent respiratory failure - wean PEEP to 5, start weaning as able, likely will need a trach L Rib FX 3-10 - multimodal pain control, IS/Pulm toilet L hemopneumothorax - CXR - 5-10% ptx on left, supplemental O2 via nasal cannunla; repeat film 4/26 PTX resolved, multimodal pain control L pulmonary contusion COPD/acute hypoxic respiratory failure - PRN albuterol  nebs, back on vent as above HTN - home norvasc  ordered, also takes coreg  Hx Prostate CA - [id number]; bone met on R 10th rib; completed radiation therapy. On ADT w/ Zytiga/prednisone  daily. Hemorrhoids FEN - resume precedex , has leus so place PICC and start TNA, add klon/sero VTE - SCD's, LMWH ID - Rocephin  for strep pneumo PNA, plan 7d Dispo - ICU Critical Care Total Time*: 36 Minutes  Dorena Gander, MD, MPH, FACS Trauma & General Surgery Use [email or web address] to contact on call provider  02/03/2024  *Care during the described time interval was provided by me. I have reviewed this patient's available data, including medical history, events of note,  physical examination and test results as part of my evaluation.

## 2024-02-03 NOTE — Progress Notes (Signed)
 Peripherally Inserted Central Catheter Placement  The IV Nurse has discussed with the patient and/or persons authorized to consent for the patient, the purpose of this procedure and the potential benefits and risks involved with this procedure.  The benefits include less needle sticks, lab draws from the catheter, and the patient may be discharged home with the catheter. Risks include, but not limited to, infection, bleeding, blood clot (thrombus formation), and puncture of an artery; nerve damage and irregular heartbeat and possibility to perform a PICC exchange if needed/ordered by physician.  Alternatives to this procedure were also discussed.  Bard Power PICC patient education guide, fact sheet on infection prevention and patient information card has been provided to patient /or left at bedside.  Telephone consent obtained via daughter.  PICC Placement Documentation  PICC Triple Lumen 02/03/24 Right Brachial 41 cm 0 cm (Active)  Indication for Insertion or Continuance of Line Administration of hyperosmolar/irritating solutions (i.e. TPN, Vancomycin, etc.) 02/03/24 1723  Exposed Catheter (cm) 0 cm 02/03/24 1723  Site Assessment Clean, Dry, Intact 02/03/24 1723  Lumen #1 Status Flushed;Saline locked;Blood return noted 02/03/24 1723  Lumen #2 Status Flushed;Saline locked;Blood return noted 02/03/24 1723  Lumen #3 Status Flushed;Saline locked;Blood return noted 02/03/24 1723  Dressing Type Transparent;Securing device 02/03/24 1723  Dressing Status Antimicrobial disc/dressing in place 02/03/24 1723  Line Care Connections checked and tightened 02/03/24 1723  Line Adjustment (NICU/IV Team Only) No 02/03/24 1723  Dressing Intervention New dressing;Adhesive placed at insertion site (IV team only) 02/03/24 1723  Dressing Change Due 02/10/24 02/03/24 1723       Jason Rojas  [name] 02/03/2024, 5:26 PM

## 2024-02-03 NOTE — Progress Notes (Signed)
 Unable to get consent on this time.  Spoke with mother via telephone who states that the daughter will be at bedside later today and that she will give consent.  Mother initially was unable to give who was in the hospital then thought it might be her husband.  Attempted to call daughter via phone but no answer, goes to voice mail.  Bedside RN Louis notified.

## 2024-02-04 LAB — GLUCOSE, CAPILLARY
Glucose-Capillary: 109 mg/dL — ABNORMAL HIGH (ref 70–99)
Glucose-Capillary: 113 mg/dL — ABNORMAL HIGH (ref 70–99)
Glucose-Capillary: 124 mg/dL — ABNORMAL HIGH (ref 70–99)
Glucose-Capillary: 127 mg/dL — ABNORMAL HIGH (ref 70–99)
Glucose-Capillary: 140 mg/dL — ABNORMAL HIGH (ref 70–99)
Glucose-Capillary: 147 mg/dL — ABNORMAL HIGH (ref 70–99)

## 2024-02-04 LAB — COMPREHENSIVE METABOLIC PANEL WITH GFR
ALT: 13 U/L (ref 0–44)
ALT: 15 U/L (ref 0–44)
AST: 19 U/L (ref 15–41)
AST: 23 U/L (ref 15–41)
Albumin: 2.3 g/dL — ABNORMAL LOW (ref 3.5–5.0)
Albumin: 2.7 g/dL — ABNORMAL LOW (ref 3.5–5.0)
Alkaline Phosphatase: 65 U/L (ref 38–126)
Alkaline Phosphatase: 80 U/L (ref 38–126)
Anion gap: 7 (ref 5–15)
Anion gap: 14 (ref 5–15)
BUN: 26 mg/dL — ABNORMAL HIGH (ref 8–23)
BUN: 27 mg/dL — ABNORMAL HIGH (ref 8–23)
CO2: 24 mmol/L (ref 22–32)
CO2: 22 mmol/L (ref 22–32)
Calcium: 8.8 mg/dL — ABNORMAL LOW (ref 8.9–10.3)
Calcium: 9.1 mg/dL (ref 8.9–10.3)
Chloride: 106 mmol/L (ref 98–111)
Chloride: 106 mmol/L (ref 98–111)
Creatinine, Ser: 0.53 mg/dL — ABNORMAL LOW (ref 0.61–1.24)
Creatinine, Ser: 0.61 mg/dL (ref 0.61–1.24)
GFR, Estimated: >60 mL/min (ref >60)
GFR, Estimated: >60 mL/min (ref >60)
Glucose, Bld: 403 mg/dL — ABNORMAL HIGH (ref 70–99)
Glucose, Bld: 107 mg/dL — ABNORMAL HIGH (ref 70–99)
Potassium: 5.5 mmol/L — ABNORMAL HIGH (ref 3.5–5.1)
Potassium: 4.2 mmol/L (ref 3.5–5.1)
Sodium: 137 mmol/L (ref 135–145)
Sodium: 142 mmol/L (ref 135–145)
Total Bilirubin: 0.4 mg/dL (ref 0.0–1.2)
Total Bilirubin: 0.3 mg/dL (ref 0.0–1.2)
Total Protein: 5.8 g/dL — ABNORMAL LOW (ref 6.5–8.1)
Total Protein: 6.8 g/dL (ref 6.5–8.1)

## 2024-02-04 LAB — CBC
HCT: 31 % — ABNORMAL LOW (ref 39.0–52.0)
Hemoglobin: 9.9 g/dL — ABNORMAL LOW (ref 13.0–17.0)
MCH: 27.6 pg (ref 26.0–34.0)
MCHC: 31.9 g/dL (ref 30.0–36.0)
MCV: 86.4 fL (ref 80.0–100.0)
Platelets: 348 10*3/uL (ref 150–400)
RBC: 3.59 MIL/uL — ABNORMAL LOW (ref 4.22–5.81)
RDW: 14.3 % (ref 11.5–15.5)
WBC: 8.1 10*3/uL (ref 4.0–10.5)
nRBC: 0 % (ref 0.0–0.2)

## 2024-02-04 LAB — PHOSPHORUS
Phosphorus: 6.4 mg/dL — ABNORMAL HIGH (ref 2.5–4.6)
Phosphorus: 4 mg/dL (ref 2.5–4.6)

## 2024-02-04 LAB — MAGNESIUM
Magnesium: 3.1 mg/dL — ABNORMAL HIGH (ref 1.7–2.4)
Magnesium: 2.6 mg/dL — ABNORMAL HIGH (ref 1.7–2.4)

## 2024-02-04 LAB — TRIGLYCERIDES
Triglycerides: 357 mg/dL — ABNORMAL HIGH (ref <150)
Triglycerides: 168 mg/dL — ABNORMAL HIGH (ref <150)

## 2024-02-04 MED ORDER — TRAVASOL 10 % IV SOLN
INTRAVENOUS | Status: AC
  Filled 2024-02-04: qty 792

## 2024-02-04 MED ORDER — SODIUM ZIRCONIUM CYCLOSILICATE 10 G PO PACK: 10.0000 g | PACK | Freq: Once | ORAL | Status: DC

## 2024-02-04 NOTE — Plan of Care (Signed)

## 2024-02-04 NOTE — TOC Progression Note (Signed)
 Transition of Care [facility]) - Progression Note    Patient Details  Name: Jason Rojas MRN: [id number] Date of Birth: 12-18-1960  [facility] of [facility]) CM/SW Contact  Kinsey Cowsert, Avanell Leigh, RN Phone Number: 02/04/2024, 4:55 PM  Clinical Narrative:    Spoke with patient's daughter, Darien Eden, per her request to discuss placement options.  She is aware that patient is still on ventilator and may need tracheostomy.  Explained that if patient receives trach, placement options may change, as not all SNFs take trach patients.   She understands that patient is not medically stable at this time, and there are factors that may change the course of his placement.  Assured her that [facility] Trauma Case Manager or Social Worker will be following up with her to assist with any placement issues.  She is appreciative of our discussion.    Expected Discharge Plan: Skilled Nursing Facility Barriers to Discharge: Continued Medical Work up  Expected Discharge Plan and Services       Living arrangements for the past 2 months: Single Family Home                                       Social Determinants of Health (SDOH) Interventions SDOH Screenings   Food Insecurity: No Food Insecurity (01/25/2024)  Housing: Low Risk  (01/25/2024)  Transportation Needs: No Transportation Needs (01/27/2024)  Utilities: Not At Risk (01/27/2024)  Tobacco Use: High Risk (01/24/2024)    Readmission Risk Interventions    01/28/2024   12:04 PM  Readmission Risk Prevention Plan  Transportation Screening Complete  PCP or Specialist Appt within 3-5 Days Complete  HRI or Home Care Consult Complete  Social Work Consult for Recovery Care Planning/Counseling Complete  Palliative Care Screening Not Applicable  Medication Review (RN Care Manager) Complete   Calla Catchings, RN, BSN  Trauma/Neuro ICU Case Manager [phone]

## 2024-02-04 NOTE — Progress Notes (Signed)
 Patient ID: Jason Rojas, male   DOB: [birth year], 63 y.o.   MRN: [id number] I met with his daughter and mother at the bedside for a clinical update. I answered their questions.  Dorena Gander, MD, MPH, FACS Please use [email or web address] to contact on call provider

## 2024-02-04 NOTE — Progress Notes (Addendum)
 Patient ID: Jason Rojas, male   DOB: 01-24-1961, 63 y.o.   MRN: [id number] Follow up - Trauma Critical Care   Patient Details:    Jason Rojas is an 63 y.o. male.  Lines/tubes : Airway 7.5 mm (Active)  Secured at (cm) 23 cm 02/04/24 0258  Measured From Lips 02/04/24 0258  Secured Location Center 02/04/24 0258  Secured By [employer] 02/04/24 0258  Bite Block No 02/04/24 0258  Tube Holder Repositioned Yes 02/04/24 0258  Prone position No 02/04/24 0258  Cuff Pressure (cm H2O) Green OR 18-26 CmH2O 02/04/24 0258  Site Condition Dry 02/04/24 0258     PICC Triple Lumen 02/03/24 Right Brachial 41 cm 0 cm (Active)  Indication for Insertion or Continuance of Line Administration of hyperosmolar/irritating solutions (i.e. TPN, Vancomycin, etc.) 02/03/24 2000  Exposed Catheter (cm) 0 cm 02/03/24 1723  Site Assessment Clean, Dry, Intact 02/03/24 2000  Lumen #1 Status Infusing 02/03/24 2000  Lumen #2 Status Flushed;Saline locked 02/03/24 2000  Lumen #3 Status Flushed;Saline locked 02/03/24 2000  Dressing Type Transparent;Securing device 02/03/24 2000  Dressing Status Antimicrobial disc/dressing in place;Clean, Dry, Intact 02/03/24 2000  Line Care Connections checked and tightened 02/03/24 2000  Line Adjustment (NICU/IV Team Only) No 02/03/24 1723  Dressing Intervention New dressing;Adhesive placed at insertion site (IV team only) 02/03/24 1723  Dressing Change Due 02/10/24 02/03/24 1723     NG/OG Vented/Dual Lumen Oral (Active)  Tube Position (Required) External length of tube 02/03/24 2000  Ongoing Placement Verification (Required) (See row information) Yes 02/03/24 2000  Site Assessment Clean, Dry, Intact 02/03/24 2000  Status Low continuous suction 02/03/24 2000  Amount of suction 78 mmHg 02/03/24 0848  Drainage Appearance Thin;Brown 02/03/24 0848  Intake (mL) 100 mL 02/02/24 0918  Output (mL) 150 mL 02/03/24 1851     Urethral Catheter [name] Latex 16 Fr. (Active)   Indication for Insertion or Continuance of Catheter Acute urinary retention (I&O Cath for 24 hrs prior to catheter insertion- Inpatient Only) 02/03/24 2000  Site Assessment Clean, Dry, Intact 02/03/24 2000  Catheter Maintenance Bag below level of bladder;Catheter secured;Drainage bag/tubing not touching floor;Insertion date on drainage bag;No dependent loops;Seal intact 02/03/24 2000  Collection Container Standard drainage bag 02/03/24 2000  Securement Method Adhesive securement device 02/03/24 2000  Urinary Catheter Interventions (if applicable) Unclamped 02/03/24 2000  Output (mL) 125 mL 02/04/24 0600    Microbiology/Sepsis markers: Results for orders placed or performed during the hospital encounter of 01/24/24  MRSA Next Gen by PCR, Nasal     Status: None   Collection Time: 01/24/24  6:25 PM   Specimen: Nasal Mucosa; Nasal Swab  Result Value Ref Range Status   MRSA by PCR Next Gen NOT DETECTED NOT DETECTED Final    Comment: (NOTE) The GeneXpert MRSA Assay (FDA approved for NASAL specimens only), is one component of a comprehensive MRSA colonization surveillance program. It is not intended to diagnose MRSA infection nor to guide or monitor treatment for MRSA infections. Test performance is not FDA approved in patients less than 65 years old. Performed at [facility], [address] [place], [place] [address]   Culture, Respiratory w Gram Stain     Status: None   Collection Time: 02/01/24  6:32 AM   Specimen: Tracheal Aspirate; Respiratory  Result Value Ref Range Status   Specimen Description TRACHEAL ASPIRATE  Final   Special Requests NONE  Final   Gram Stain   Final    RARE  SQUAMOUS EPITHELIAL CELLS PRESENT WBC PRESENT, PREDOMINANTLY PMN ABUNDANT GRAM POSITIVE COCCI IN PAIRS IN CHAINS IN CLUSTERS Performed at [facility], [address] [place], [place] [address]    Culture MODERATE STREPTOCOCCUS PNEUMONIAE  Final   Report Status 02/03/2024 FINAL  Final    Organism ID, Bacteria STREPTOCOCCUS PNEUMONIAE  Final      Susceptibility   Streptococcus pneumoniae - MIC*    ERYTHROMYCIN >=8 RESISTANT Resistant     LEVOFLOXACIN 0.5 SENSITIVE Sensitive     VANCOMYCIN 0.5 SENSITIVE Sensitive     PENICILLIN (meningitis) 0.25 RESISTANT Resistant     PENO - penicillin 0.25      PENICILLIN (non-meningitis) 0.25 SENSITIVE Sensitive     PENICILLIN (oral) 0.25 INTERMEDIATE Intermediate     CEFTRIAXONE  (non-meningitis) 0.25 SENSITIVE Sensitive     CEFTRIAXONE  (meningitis) 0.25 SENSITIVE Sensitive     * MODERATE STREPTOCOCCUS PNEUMONIAE    Anti-infectives:  Anti-infectives (From admission, onward)    Start     Dose/Rate Route Frequency Ordered Stop   02/03/24 1000  cefTRIAXone  (ROCEPHIN ) 2 g in sodium chloride  0.9 % 100 mL IVPB        2 g 200 mL/hr over 30 Minutes Intravenous Every 24 hours 02/03/24 0818 02/09/24 0959   02/02/24 0930  ceFEPIme (MAXIPIME) 2 g in sodium chloride  0.9 % 100 mL IVPB  Status:  Discontinued        2 g 200 mL/hr over 30 Minutes Intravenous Every 8 hours 02/02/24 0835 02/03/24 0818   01/24/24 0900  ceFAZolin  (ANCEF ) IVPB 2g/100 mL premix        2 g 200 mL/hr over 30 Minutes Intravenous STAT 01/24/24 0854 01/24/24 0941      Consults: Treatment Team:  Md, Trauma, MD    Studies:    Events:  Subjective:    Overnight Issues:   Objective:  Vital signs for last 24 hours: Temp:  [97.7 F (36.5 C)-98.6 F (37 C)] 97.8 F (36.6 C) (05/06 0352) Pulse Rate:  [56-94] 65 (05/06 0700) Resp:  [17-19] 18 (05/06 0700) BP: (74-158)/(51-133) 124/64 (05/06 0700) SpO2:  [94 %-100 %] 98 % (05/06 0700) FiO2 (%):  [40 %] 40 % (05/06 0258) Weight:  [87.6 kg-92.5 kg] 92.5 kg (05/06 0500)  Hemodynamic parameters for last 24 hours:    Intake/Output from previous day: 05/05 0701 - 05/06 0700 In: 1525 [I.V.:782; IV Piggyback:743] Out: 525 [Urine:375; Emesis/NG output:150]  Intake/Output this shift: No intake/output data  recorded.  Vent settings for last 24 hours: Vent Mode: PRVC FiO2 (%):  [40 %] 40 % Set Rate:  [18 bmp] 18 bmp Vt Set:  [510 mL] 510 mL PEEP:  [5 cmH20] 5 cmH20 Plateau Pressure:  [19 cmH20-21 cmH20] 19 cmH20  Physical Exam:  General: on vent Neuro: sedated but arouses HEENT/Neck: ETT Resp: few rhonchi, tender L ribs CVS: RRR GI: soft but distended, NT, +tympani Extremities: calves soft  Results for orders placed or performed during the hospital encounter of 01/24/24 (from the past 24 hours)  Glucose, capillary     Status: None   Collection Time: 02/03/24  7:41 AM  Result Value Ref Range   Glucose-Capillary 97 70 - 99 mg/dL  Glucose, capillary     Status: Abnormal   Collection Time: 02/03/24 11:04 AM  Result Value Ref Range   Glucose-Capillary 104 (H) 70 - 99 mg/dL  Glucose, capillary     Status: Abnormal   Collection Time: 02/03/24  3:30 PM  Result Value Ref Range  Glucose-Capillary 101 (H) 70 - 99 mg/dL  Glucose, capillary     Status: Abnormal   Collection Time: 02/03/24  7:44 PM  Result Value Ref Range   Glucose-Capillary 109 (H) 70 - 99 mg/dL  Glucose, capillary     Status: Abnormal   Collection Time: 02/03/24 11:57 PM  Result Value Ref Range   Glucose-Capillary 125 (H) 70 - 99 mg/dL  Glucose, capillary     Status: Abnormal   Collection Time: 02/04/24  3:25 AM  Result Value Ref Range   Glucose-Capillary 109 (H) 70 - 99 mg/dL  Triglycerides     Status: Abnormal   Collection Time: 02/04/24  5:52 AM  Result Value Ref Range   Triglycerides 357 (H) <150 mg/dL  CBC     Status: Abnormal   Collection Time: 02/04/24  5:52 AM  Result Value Ref Range   WBC 8.1 4.0 - 10.5 K/uL   RBC 3.59 (L) 4.22 - 5.81 MIL/uL   Hemoglobin 9.9 (L) 13.0 - 17.0 g/dL   HCT 91.4 (L) 78.2 - 95.6 %   MCV 86.4 80.0 - 100.0 fL   MCH 27.6 26.0 - 34.0 pg   MCHC 31.9 30.0 - 36.0 g/dL   RDW 21.3 08.6 - 57.8 %   Platelets 348 150 - 400 K/uL   nRBC 0.0 0.0 - 0.2 %  Comprehensive metabolic panel      Status: Abnormal   Collection Time: 02/04/24  5:52 AM  Result Value Ref Range   Sodium 137 135 - 145 mmol/L   Potassium 5.5 (H) 3.5 - 5.1 mmol/L   Chloride 106 98 - 111 mmol/L   CO2 24 22 - 32 mmol/L   Glucose, Bld 403 (H) 70 - 99 mg/dL   BUN 26 (H) 8 - 23 mg/dL   Creatinine, Ser 4.69 (L) 0.61 - 1.24 mg/dL   Calcium 8.8 (L) 8.9 - 10.3 mg/dL   Total Protein 5.8 (L) 6.5 - 8.1 g/dL   Albumin 2.3 (L) 3.5 - 5.0 g/dL   AST 19 15 - 41 U/L   ALT 13 0 - 44 U/L   Alkaline Phosphatase 65 38 - 126 U/L   Total Bilirubin 0.4 0.0 - 1.2 mg/dL   GFR, Estimated >62 >95 mL/min   Anion gap 7 5 - 15  Magnesium      Status: Abnormal   Collection Time: 02/04/24  5:52 AM  Result Value Ref Range   Magnesium  3.1 (H) 1.7 - 2.4 mg/dL  Phosphorus     Status: Abnormal   Collection Time: 02/04/24  5:52 AM  Result Value Ref Range   Phosphorus 6.4 (H) 2.5 - 4.6 mg/dL    Assessment & Plan: Present on Admission:  Fall    LOS: 11 days   Additional comments:I reviewed the patient's new clinical lab test results. 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 On ADT w/ Zytiga/prednisone  daily. Hemorrhoids FEN - precedex , has ileus so continue PICC and TNA, klon/sero, lokelma x 1 for hyperkalemia VTE -  SCD's, LMWH ID - Rocephin  for strep pneumo PNA, plan 7d Dispo - ICU Critical Care Total Time*: 35 Minutes  Dorena Gander, MD, MPH, FACS Trauma & General Surgery Use [email or web address] to contact on call  provider  02/04/2024  *Care during the described time interval was provided by me. I have reviewed this patient's available data, including medical history, events of note, physical examination and test results as part of my evaluation.

## 2024-02-04 NOTE — Progress Notes (Signed)
 PHARMACY - TOTAL PARENTERAL NUTRITION CONSULT NOTE   Indication: Prolonged ileus  Patient Measurements: Height: 5\' 6"  (167.6 cm) Weight: 92.5 kg (203 lb 14.8 oz) IBW/kg (Calculated) : 63.8 TPN AdjBW (KG): 72.6 Body mass index is 32.91 kg/m. Usual Weight: 85-92 kg   Assessment: Patient with history of heavy alcohol  use presented with fall on stairs, acute respiratory failure requiring mechanical ventilation, L rib fractures, L hemopneumothorax, L pulmonary contusion. Patient also developed Ileus, NGT to suction. Minimal nutrition since admission 4/25. Pharmacy consulted to dose TPN.   Glucose / Insulin: CBGs < 180 not requiring SSI, no history of diabetes  Electrolytes: Labs drawn distally to TPN this AM, repeat labs are WNL except for slightly elevated Mg - 2.6.  Renal: Scr < 1.0, UOP 0.2 mL/kg/hr  Hepatic: TG 150 5/3, propofol  off after being transition to dexmedetomidine   Intake / Output; MIVF: Net IO Since Admission: -498.89 mL [02/04/24 0912] NGT output in mL = 100 (5/3), 150 (5/4), 150 (5/5) GI Imaging: 5/4 KUB Findings suggest ileus  4/25 CT Chest/Ab/Pelv Stomach/Bowel: Unremarkable GI Surgeries / Procedures: n/a   Central access: PICC Line ordered 5/5  TPN start date: 5/5   Nutritional Goals: Goal TPN rate is 80 mL/hr (provides 105 g of protein and 1997 kcals per day) Will continue to use 10% dextrose  today to keep under 150 g dextrose  given refeeding risk (increase to 15% as tolerated), will likely titrate rate to goal before increasing dextrose   RD Assessment: Estimated Needs Total Energy Estimated Needs: 1900-2100 Total Protein Estimated Needs: 105-120g Total Fluid Estimated Needs: >/=1.9L  Current Nutrition:  NPO, NGT LIWS   Plan:  Increase TPN to 60 mL/hr at 1800 Electrolytes in TPN: Na 73mEq/L, K 53mEq/L, Ca 65mEq/L, Mg 5 mEq/L, and Phos 15 mmol/L. Cl:Ac 1:1 Add standard MVI and trace elements to TPN Continue very Sensitive q4h SSI and adjust as needed   Monitor TPN labs on Mon/Thurs and tomorrow given new start  Thiamine  100 mg IV daily x7 days   Jason Rojas 02/04/2024,9:12 AM

## 2024-02-05 LAB — CBC
HCT: 31 % — ABNORMAL LOW (ref 39.0–52.0)
Hemoglobin: 9.9 g/dL — ABNORMAL LOW (ref 13.0–17.0)
MCH: 28 pg (ref 26.0–34.0)
MCHC: 31.9 g/dL (ref 30.0–36.0)
MCV: 87.6 fL (ref 80.0–100.0)
Platelets: 348 10*3/uL (ref 150–400)
RBC: 3.54 MIL/uL — ABNORMAL LOW (ref 4.22–5.81)
RDW: 14.4 % (ref 11.5–15.5)
WBC: 9.1 10*3/uL (ref 4.0–10.5)
nRBC: 0 % (ref 0.0–0.2)

## 2024-02-05 LAB — RENAL FUNCTION PANEL
Albumin: 2.3 g/dL — ABNORMAL LOW (ref 3.5–5.0)
Anion gap: 10 (ref 5–15)
BUN: 22 mg/dL (ref 8–23)
CO2: 23 mmol/L (ref 22–32)
Calcium: 9.3 mg/dL (ref 8.9–10.3)
Chloride: 104 mmol/L (ref 98–111)
Creatinine, Ser: 0.56 mg/dL — ABNORMAL LOW (ref 0.61–1.24)
GFR, Estimated: >60 mL/min (ref >60)
Glucose, Bld: 442 mg/dL — ABNORMAL HIGH (ref 70–99)
Phosphorus: 5.8 mg/dL — ABNORMAL HIGH (ref 2.5–4.6)
Potassium: 5.8 mmol/L — ABNORMAL HIGH (ref 3.5–5.1)
Sodium: 137 mmol/L (ref 135–145)

## 2024-02-05 LAB — GLUCOSE, CAPILLARY
Glucose-Capillary: 144 mg/dL — ABNORMAL HIGH (ref 70–99)
Glucose-Capillary: 134 mg/dL — ABNORMAL HIGH (ref 70–99)
Glucose-Capillary: 150 mg/dL — ABNORMAL HIGH (ref 70–99)
Glucose-Capillary: 128 mg/dL — ABNORMAL HIGH (ref 70–99)
Glucose-Capillary: 160 mg/dL — ABNORMAL HIGH (ref 70–99)
Glucose-Capillary: 142 mg/dL — ABNORMAL HIGH (ref 70–99)

## 2024-02-05 LAB — PHOSPHORUS: Phosphorus: 4.1 mg/dL (ref 2.5–4.6)

## 2024-02-05 LAB — MAGNESIUM
Magnesium: 2.6 mg/dL — ABNORMAL HIGH (ref 1.7–2.4)
Magnesium: 2.3 mg/dL (ref 1.7–2.4)

## 2024-02-05 LAB — BASIC METABOLIC PANEL WITH GFR
Anion gap: 10 (ref 5–15)
BUN: 23 mg/dL (ref 8–23)
CO2: 23 mmol/L (ref 22–32)
Calcium: 9.4 mg/dL (ref 8.9–10.3)
Chloride: 108 mmol/L (ref 98–111)
Creatinine, Ser: 0.6 mg/dL — ABNORMAL LOW (ref 0.61–1.24)
GFR, Estimated: >60 mL/min (ref >60)
Glucose, Bld: 139 mg/dL — ABNORMAL HIGH (ref 70–99)
Potassium: 3.8 mmol/L (ref 3.5–5.1)
Sodium: 141 mmol/L (ref 135–145)

## 2024-02-05 MED ORDER — DEXTROSE 70 % IV SOLN
INTRAVENOUS | Status: AC
  Filled 2024-02-05: qty 1056

## 2024-02-05 NOTE — Progress Notes (Signed)
 PT Cancellation Note  Patient Details Name: Jason Rojas MRN: [id number] DOB: [birth year]   Cancelled Treatment:    Reason Eval/Treat Not Completed: Medical issues which prohibited therapy (Pt still intubated and sedated.  Nurse asked to HOLD today.)   Florencia Hunter 02/05/2024, 10:08 AM Marymargaret Kirker M,PT Acute Rehab Services [phone]

## 2024-02-05 NOTE — Plan of Care (Signed)

## 2024-02-05 NOTE — Progress Notes (Addendum)
 PHARMACY - TOTAL PARENTERAL NUTRITION CONSULT NOTE   Indication: Prolonged ileus  Patient Measurements: Height: 5\' 6"  (167.6 cm) Weight: 89 kg (196 lb 3.4 oz) IBW/kg (Calculated) : 63.8 TPN AdjBW (KG): 72.6 Body mass index is 31.67 kg/m. Usual Weight: 85-92 kg   Assessment: Patient with history of heavy alcohol  use presented with fall on stairs, acute respiratory failure requiring mechanical ventilation, L rib fractures, L hemopneumothorax, L pulmonary contusion. Patient also developed Ileus, NGT to suction. Minimal nutrition since admission 4/25. Pharmacy consulted to dose TPN.   Glucose / Insulin: CBGs < 180 not requiring SSI, no history of diabetes  Electrolytes: Labs drawn distally to TPN this AM, repeat labs are WNL.  Renal: Scr < 1.0, UOP 0.2 mL/kg/hr  Hepatic: TG 168 5/6, propofol  off after being transition to dexmedetomidine   Intake / Output; MIVF: Net IO Since Admission: 333.2 mL [02/05/24 0722] NGT output in mL = 100 (5/3), 150 (5/4), 150 (5/5), 250 (5/6) GI Imaging: 5/4 KUB Findings suggest ileus  4/25 CT Chest/Ab/Pelv Stomach/Bowel: Unremarkable GI Surgeries / Procedures: n/a   Central access: PICC Line ordered 5/5  TPN start date: 5/5   Nutritional Goals: Goal TPN rate is 80 mL/hr (provides 105 g of protein and 1997 kcals per day) Will continue to use 10% dextrose  today, likely 15% tomorrow to reach goal   RD Assessment: Estimated Needs Total Energy Estimated Needs: 1900-2100 Total Protein Estimated Needs: 105-120g Total Fluid Estimated Needs: >/=1.9L  Current Nutrition:  NPO, NGT LIWS   Plan:  Increase TPN to 80 mL/hr at 1800 Electrolytes in TPN: Na 71mEq/L, K 35mEq/L, Ca 6mEq/L, Mg 5 mEq/L, and Phos 12 mmol/L. Cl:Ac 1:1 Add standard MVI and trace elements to TPN Continue very Sensitive q4h SSI and adjust as needed  Monitor TPN labs on Mon/Thurs Thiamine  100 mg IV daily x7 days  Stop enteral mutlivitamin for now, resume when enteral nutrition is resumed    Jason Rojas 02/05/2024,7:22 AM

## 2024-02-05 NOTE — Progress Notes (Signed)
 Patient ID: Jason Rojas, male   DOB: 1960-11-26, 63 y.o.   MRN: [id number] Follow up - Trauma Critical Care   Patient Details:    Jason Rojas is an 63 y.o. male.  Lines/tubes : Airway 7.5 mm (Active)  Secured at (cm) 23 cm 02/05/24 0327  Measured From Lips 02/05/24 0327  Secured Location Right 02/05/24 0327  Secured By [employer] 02/05/24 0327  Bite Block No 02/05/24 0327  Tube Holder Repositioned Yes 02/05/24 0327  Prone position No 02/05/24 0327  Cuff Pressure (cm H2O) Clear OR 27-39 CmH2O 02/04/24 2013  Site Condition Dry 02/05/24 0331     PICC Triple Lumen 02/03/24 Right Brachial 41 cm 0 cm (Active)  Indication for Insertion or Continuance of Line Administration of hyperosmolar/irritating solutions (i.e. TPN, Vancomycin, etc.) 02/04/24 2000  Exposed Catheter (cm) 0 cm 02/03/24 1723  Site Assessment Clean, Dry, Intact 02/04/24 2000  Lumen #1 Status Flushed 02/04/24 2000  Lumen #2 Status Flushed;Saline locked 02/04/24 2000  Lumen #3 Status Flushed;Saline locked 02/04/24 2000  Dressing Type Transparent;Securing device 02/04/24 2000  Dressing Status Antimicrobial disc/dressing in place 02/04/24 2000  Line Care Connections checked and tightened 02/04/24 2000  Line Adjustment (NICU/IV Team Only) No 02/03/24 1723  Dressing Intervention New dressing;Adhesive placed at insertion site (IV team only) 02/03/24 1723  Dressing Change Due 02/10/24 02/04/24 2000     NG/OG Vented/Dual Lumen Oral (Active)  Tube Position (Required) Marking at nare/corner of mouth 02/05/24 0331  Ongoing Placement Verification (Required) (See row information) Yes 02/04/24 0800  Site Assessment Clean, Dry, Intact 02/05/24 0331  Interventions Irrigated 02/04/24 1930  Status Low continuous suction 02/03/24 2000  Amount of suction 78 mmHg 02/03/24 0848  Drainage Appearance Thin;Brown 02/03/24 0848  Intake (mL) 100 mL 02/02/24 0918  Output (mL) 50 mL 02/04/24 1550     Urethral Catheter [name]  Rn Latex 16 Fr. (Active)  Indication for Insertion or Continuance of Catheter Acute urinary retention (I&O Cath for 24 hrs prior to catheter insertion- Inpatient Only) 02/04/24 2000  Site Assessment Clean, Dry, Intact 02/04/24 2000  Catheter Maintenance Bag below level of bladder;Catheter secured;Drainage bag/tubing not touching floor;Insertion date on drainage bag;No dependent loops;Seal intact 02/04/24 2000  Collection Container Standard drainage bag 02/04/24 2000  Securement Method Adhesive securement device 02/04/24 2000  Urinary Catheter Interventions (if applicable) Unclamped 02/04/24 0800  Output (mL) 50 mL 02/05/24 0400    Microbiology/Sepsis markers: Results for orders placed or performed during the hospital encounter of 01/24/24  MRSA Next Gen by PCR, Nasal     Status: None   Collection Time: 01/24/24  6:25 PM   Specimen: Nasal Mucosa; Nasal Swab  Result Value Ref Range Status   MRSA by PCR Next Gen NOT DETECTED NOT DETECTED Final    Comment: (NOTE) The GeneXpert MRSA Assay (FDA approved for NASAL specimens only), is one component of a comprehensive MRSA colonization surveillance program. It is not intended to diagnose MRSA infection nor to guide or monitor treatment for MRSA infections. Test performance is not FDA approved in patients less than 23 years old. Performed at [facility], [address], [place], [place] [address]   Culture, Respiratory w Gram Stain     Status: None   Collection Time: 02/01/24  6:32 AM   Specimen: Tracheal Aspirate; Respiratory  Result Value Ref Range Status   Specimen Description TRACHEAL ASPIRATE  Final   Special Requests NONE  Final   Gram Stain   Final  RARE SQUAMOUS EPITHELIAL CELLS PRESENT WBC PRESENT, PREDOMINANTLY PMN ABUNDANT GRAM POSITIVE COCCI IN PAIRS IN CHAINS IN CLUSTERS Performed at [facility], [address] [place], [place] [address]    Culture MODERATE STREPTOCOCCUS PNEUMONIAE  Final   Report Status  02/03/2024 FINAL  Final   Organism ID, Bacteria STREPTOCOCCUS PNEUMONIAE  Final      Susceptibility   Streptococcus pneumoniae - MIC*    ERYTHROMYCIN >=8 RESISTANT Resistant     LEVOFLOXACIN 0.5 SENSITIVE Sensitive     VANCOMYCIN 0.5 SENSITIVE Sensitive     PENICILLIN (meningitis) 0.25 RESISTANT Resistant     PENO - penicillin 0.25      PENICILLIN (non-meningitis) 0.25 SENSITIVE Sensitive     PENICILLIN (oral) 0.25 INTERMEDIATE Intermediate     CEFTRIAXONE  (non-meningitis) 0.25 SENSITIVE Sensitive     CEFTRIAXONE  (meningitis) 0.25 SENSITIVE Sensitive     * MODERATE STREPTOCOCCUS PNEUMONIAE    Anti-infectives:  Anti-infectives (From admission, onward)    Start     Dose/Rate Route Frequency Ordered Stop   02/03/24 1000  cefTRIAXone  (ROCEPHIN ) 2 g in sodium chloride  0.9 % 100 mL IVPB        2 g 200 mL/hr over 30 Minutes Intravenous Every 24 hours 02/03/24 0818 02/09/24 0959   02/02/24 0930  ceFEPIme (MAXIPIME) 2 g in sodium chloride  0.9 % 100 mL IVPB  Status:  Discontinued        2 g 200 mL/hr over 30 Minutes Intravenous Every 8 hours 02/02/24 0835 02/03/24 0818   01/24/24 0900  ceFAZolin  (ANCEF ) IVPB 2g/100 mL premix        2 g 200 mL/hr over 30 Minutes Intravenous STAT 01/24/24 0854 01/24/24 0941     Consults: Treatment Team:  Md, Trauma, MD    Studies:    Events:  Subjective:    Overnight Issues:   Objective:  Vital signs for last 24 hours: Temp:  [98 F (36.7 C)-99.2 F (37.3 C)] 98.1 F (36.7 C) (05/07 0351) Pulse Rate:  [56-84] 63 (05/07 0700) Resp:  [18-29] 19 (05/07 0700) BP: (107-145)/(51-108) 145/77 (05/07 0700) SpO2:  [93 %-99 %] 99 % (05/07 0700) FiO2 (%):  [40 %-50 %] 40 % (05/07 0327) Weight:  [89 kg] 89 kg (05/07 0330)  Hemodynamic parameters for last 24 hours:    Intake/Output from previous day: 05/06 0701 - 05/07 0700 In: 1602.8 [I.V.:1502.8; IV Piggyback:100] Out: 720 [Urine:470; Emesis/NG output:250]  Intake/Output this shift: No  intake/output data recorded.  Vent settings for last 24 hours: Vent Mode: PRVC FiO2 (%):  [40 %-50 %] 40 % Set Rate:  [18 bmp] 18 bmp Vt Set:  [510 mL] 510 mL PEEP:  [5 cmH20] 5 cmH20 Pressure Support:  [10 cmH20-12 cmH20] 10 cmH20 Plateau Pressure:  [20 cmH20] 20 cmH20  Physical Exam:  General: on vent Neuro: sedated but arouses HEENT/Neck: ETT Resp: clear to auscultation bilaterally and tender L ribs CVS: RRR GI: distended but soft, still some tympani Extremities: mils edema  Results for orders placed or performed during the hospital encounter of 01/24/24 (from the past 24 hours)  Glucose, capillary     Status: Abnormal   Collection Time: 02/04/24  7:46 AM  Result Value Ref Range   Glucose-Capillary 113 (H) 70 - 99 mg/dL  Comprehensive metabolic panel     Status: Abnormal   Collection Time: 02/04/24  8:30 AM  Result Value Ref Range   Sodium 142 135 - 145 mmol/L   Potassium 4.2 3.5 - 5.1 mmol/L  Chloride 106 98 - 111 mmol/L   CO2 22 22 - 32 mmol/L   Glucose, Bld 107 (H) 70 - 99 mg/dL   BUN 27 (H) 8 - 23 mg/dL   Creatinine, Ser 1.61 0.61 - 1.24 mg/dL   Calcium 9.1 8.9 - 09.6 mg/dL   Total Protein 6.8 6.5 - 8.1 g/dL   Albumin 2.7 (L) 3.5 - 5.0 g/dL   AST 23 15 - 41 U/L   ALT 15 0 - 44 U/L   Alkaline Phosphatase 80 38 - 126 U/L   Total Bilirubin 0.3 0.0 - 1.2 mg/dL   GFR, Estimated >04 >54 mL/min   Anion gap 14 5 - 15  Magnesium      Status: Abnormal   Collection Time: 02/04/24  8:30 AM  Result Value Ref Range   Magnesium  2.6 (H) 1.7 - 2.4 mg/dL  Phosphorus     Status: None   Collection Time: 02/04/24  8:30 AM  Result Value Ref Range   Phosphorus 4.0 2.5 - 4.6 mg/dL  Triglycerides     Status: Abnormal   Collection Time: 02/04/24  8:30 AM  Result Value Ref Range   Triglycerides 168 (H) <150 mg/dL  Glucose, capillary     Status: Abnormal   Collection Time: 02/04/24 11:06 AM  Result Value Ref Range   Glucose-Capillary 124 (H) 70 - 99 mg/dL  Glucose, capillary      Status: Abnormal   Collection Time: 02/04/24  3:38 PM  Result Value Ref Range   Glucose-Capillary 127 (H) 70 - 99 mg/dL  Glucose, capillary     Status: Abnormal   Collection Time: 02/04/24  7:47 PM  Result Value Ref Range   Glucose-Capillary 140 (H) 70 - 99 mg/dL  Glucose, capillary     Status: Abnormal   Collection Time: 02/04/24 11:36 PM  Result Value Ref Range   Glucose-Capillary 147 (H) 70 - 99 mg/dL  CBC     Status: Abnormal   Collection Time: 02/05/24  3:26 AM  Result Value Ref Range   WBC 9.1 4.0 - 10.5 K/uL   RBC 3.54 (L) 4.22 - 5.81 MIL/uL   Hemoglobin 9.9 (L) 13.0 - 17.0 g/dL   HCT 09.8 (L) 11.9 - 14.7 %   MCV 87.6 80.0 - 100.0 fL   MCH 28.0 26.0 - 34.0 pg   MCHC 31.9 30.0 - 36.0 g/dL   RDW 82.9 56.2 - 13.0 %   Platelets 348 150 - 400 K/uL   nRBC 0.0 0.0 - 0.2 %  Renal function panel     Status: Abnormal   Collection Time: 02/05/24  3:26 AM  Result Value Ref Range   Sodium 137 135 - 145 mmol/L   Potassium 5.8 (H) 3.5 - 5.1 mmol/L   Chloride 104 98 - 111 mmol/L   CO2 23 22 - 32 mmol/L   Glucose, Bld 442 (H) 70 - 99 mg/dL   BUN 22 8 - 23 mg/dL   Creatinine, Ser 8.65 (L) 0.61 - 1.24 mg/dL   Calcium 9.3 8.9 - 78.4 mg/dL   Phosphorus 5.8 (H) 2.5 - 4.6 mg/dL   Albumin 2.3 (L) 3.5 - 5.0 g/dL   GFR, Estimated >69 >62 mL/min   Anion gap 10 5 - 15  Magnesium      Status: Abnormal   Collection Time: 02/05/24  3:26 AM  Result Value Ref Range   Magnesium  2.6 (H) 1.7 - 2.4 mg/dL  Glucose, capillary     Status: Abnormal   Collection Time: 02/05/24  3:28 AM  Result Value Ref Range   Glucose-Capillary 144 (H) 70 - 99 mg/dL    Assessment & Plan: Present on Admission:  Fall    LOS: 12 days   Additional comments:I reviewed the patient's new clinical lab test results. Labs are being re-drawn 63 y/o M s/p Fall on stairs Acute hypoxic ventilator dependent respiratory failure - continue weaning as able, likely will need a trach L Rib FX 3-10 - multimodal pain control,  IS/Pulm toilet L hemopneumothorax - CXR - 5-10% ptx on left, supplemental O2 via nasal cannunla; repeat film 4/26 PTX resolved, multimodal pain control L pulmonary contusion COPD/acute hypoxic respiratory failure - PRN albuterol  nebs, back on vent as above HTN - home norvasc  ordered, also takes coreg  but holding as HR 60s Hx Prostate CA - [id number]; bone met on R 10th rib; completed radiation therapy. On ADT w/ Zytiga/prednisone  daily at home. Hemorrhoids FEN - precedex , has ileus so continue PICC and TNA, klon/sero, labs re-drawn as likely spurious VTE - SCD's, LMWH ID - Rocephin  for strep pneumo PNA, plan 7d Dispo - ICU Critical Care Total Time*: 33 Minutes  Dorena Gander, MD, MPH, FACS Trauma & General Surgery Use [email or web address] to contact on call provider  02/05/2024  *Care during the described time interval was provided by me. I have reviewed this patient's available data, including medical history, events of note, physical examination and test results as part of my evaluation.

## 2024-02-06 LAB — COMPREHENSIVE METABOLIC PANEL WITH GFR
ALT: 16 U/L (ref 0–44)
AST: 21 U/L (ref 15–41)
Albumin: 2.4 g/dL — ABNORMAL LOW (ref 3.5–5.0)
Alkaline Phosphatase: 79 U/L (ref 38–126)
Anion gap: 10 (ref 5–15)
BUN: 20 mg/dL (ref 8–23)
CO2: 24 mmol/L (ref 22–32)
Calcium: 9.4 mg/dL (ref 8.9–10.3)
Chloride: 108 mmol/L (ref 98–111)
Creatinine, Ser: 0.46 mg/dL — ABNORMAL LOW (ref 0.61–1.24)
GFR, Estimated: >60 mL/min (ref >60)
Glucose, Bld: 121 mg/dL — ABNORMAL HIGH (ref 70–99)
Potassium: 4.5 mmol/L (ref 3.5–5.1)
Sodium: 142 mmol/L (ref 135–145)
Total Bilirubin: <0.2 mg/dL (ref 0.0–1.2)
Total Protein: 6.6 g/dL (ref 6.5–8.1)

## 2024-02-06 LAB — GLUCOSE, CAPILLARY
Glucose-Capillary: 109 mg/dL — ABNORMAL HIGH (ref 70–99)
Glucose-Capillary: 118 mg/dL — ABNORMAL HIGH (ref 70–99)
Glucose-Capillary: 119 mg/dL — ABNORMAL HIGH (ref 70–99)
Glucose-Capillary: 125 mg/dL — ABNORMAL HIGH (ref 70–99)
Glucose-Capillary: 128 mg/dL — ABNORMAL HIGH (ref 70–99)
Glucose-Capillary: 131 mg/dL — ABNORMAL HIGH (ref 70–99)

## 2024-02-06 LAB — CBC
HCT: 34.1 % — ABNORMAL LOW (ref 39.0–52.0)
Hemoglobin: 10.8 g/dL — ABNORMAL LOW (ref 13.0–17.0)
MCH: 27.1 pg (ref 26.0–34.0)
MCHC: 31.7 g/dL (ref 30.0–36.0)
MCV: 85.7 fL (ref 80.0–100.0)
Platelets: 343 10*3/uL (ref 150–400)
RBC: 3.98 MIL/uL — ABNORMAL LOW (ref 4.22–5.81)
RDW: 14.3 % (ref 11.5–15.5)
WBC: 8.3 10*3/uL (ref 4.0–10.5)
nRBC: 0 % (ref 0.0–0.2)

## 2024-02-06 LAB — PHOSPHORUS: Phosphorus: 4.6 mg/dL (ref 2.5–4.6)

## 2024-02-06 LAB — MAGNESIUM: Magnesium: 2 mg/dL (ref 1.7–2.4)

## 2024-02-06 MED ORDER — IPRATROPIUM-ALBUTEROL 0.5-2.5 (3) MG/3ML IN SOLN
3.0000 mL | Freq: Three times a day (TID) | RESPIRATORY_TRACT | Status: DC
  Administered 2024-02-06 – 2024-02-11 (×16): 3 mL via RESPIRATORY_TRACT
  Filled 2024-02-06 (×16): qty 3

## 2024-02-06 MED ORDER — MAGNESIUM CITRATE PO SOLN
1.0000 | Freq: Once | ORAL | Status: AC
  Administered 2024-02-06: 1
  Filled 2024-02-06: qty 296

## 2024-02-06 MED ORDER — POTASSIUM ACETATE 2 MEQ/ML IV SOLN
INTRAVENOUS | Status: AC
  Filled 2024-02-06: qty 1056

## 2024-02-06 NOTE — Progress Notes (Signed)
 Nutrition Follow-up  DOCUMENTATION CODES:   Not applicable  INTERVENTION:  Continue TPN until oral nutrition able to be initiated  Monitor ability to initiate enteral nutrition.  If/when TF indicated, recommend: Starting Osmolite 1.5 at 20ml/hr and advance by 10ml q12h as tolerated to goal rate of  14ml/hr ( per day) 60ml ProSource TF20 BID Provides 1960 kcal, 115g protein, per day   NUTRITION DIAGNOSIS:  Inadequate oral intake related to acute illness, altered GI function as evidenced by NPO status. - remains applicable  GOAL:  Patient will meet greater than or equal to 90% of their needs - goal met via TPN  MONITOR:  Labs, Weight trends, Vent status  REASON FOR ASSESSMENT:  Consult, Ventilator New TPN/TNA  ASSESSMENT:  Pt admitted after a fall on stairs leading to L rib fractures 3-10, hemopneumothorax and pulmonary contusion. PMH significant for COPD, HTN, prostate cancer and ETOH use.  5/2: re-intubated d/t hypoxia and more obtunded 5/5: abdominal xray- findings suggest ileus  Patient remains intubated on ventilator support MV: 10.3 L/min Temp (24hrs), Avg:97.5 F (36.4 C), Min:96.7 F (35.9 C), Max:98.3 F (36.8 C)  TPN remains at goal -  80 mL/hr (provides 105 g of protein and 1977 kcals per day)   Pt having BM's. Minimal output noted from OGT.  Per Surgery note, ileus improving, trial OGT clamping.   Checked in with pt at bedside. Abdomen appears distended and taut.   Admit weight: 92 kg (likely stated) First measured weight (5/5): 87.6 kg Current weight: 93 kg  Drains/lines: OGT to LIWS- 100ml x24 hours PICC line  Medications: colace BID, folvite , miralax  daily, thiamine , IV abx  Labs: reviewed  CBG's 118-160 x24 hours  Diet Order:   Diet Order             Diet NPO time specified  Diet effective now                   EDUCATION NEEDS:  No education needs have been identified at this time  Skin:  Skin Assessment:  Reviewed RN Assessment  Last BM:  5/8 type 7 x2 small  Height:  Ht Readings from Last 1 Encounters:  01/31/24 5\' 6"  (1.676 m)    Weight:  Wt Readings from Last 1 Encounters:  02/06/24 93 kg    Ideal Body Weight:  64.5 kg  BMI:  Body mass index is 33.09 kg/m.  Estimated Nutritional Needs:   Kcal:  1900-2100  Protein:  105-120g  Fluid:  >/=1.9L  Jason Rojas, RDN, LDN Clinical Nutrition See AMiON for contact information.

## 2024-02-06 NOTE — Progress Notes (Signed)
 Physical Therapy Discharge Patient Details Name: Jason Rojas MRN: [id number] DOB: 1960-10-13 Today's Date: 02/06/2024 Time:  -     Patient discharged from PT services secondary to medical decline - will need to re-order PT to resume therapy services.  Please see latest therapy progress note for current level of functioning and progress toward goals.    Progress and discharge plan discussed with patient and/or caregiver: Patient unable to participate in discharge planning and no caregivers available  GP     Florencia Hunter 02/06/2024, 10:29 AM  Ebonye Reade M,PT Acute Rehab Services [phone]

## 2024-02-06 NOTE — Progress Notes (Signed)
 PT Cancellation Note  Patient Details Name: Jason Rojas MRN: [id number] DOB: 28-Apr-1961   Cancelled Treatment:    Reason Eval/Treat Not Completed: Medical issues which prohibited therapy;Patient not medically ready (Sign off due to pt still sedated and intubated. Please reorder if pt becomes appropriate. Nurse in agreement.)   Jason Rojas 02/06/2024, 10:29 AM Jason Rojas,PT Acute Rehab Services [phone]

## 2024-02-06 NOTE — Progress Notes (Signed)
 PHARMACY - TOTAL PARENTERAL NUTRITION CONSULT NOTE   Indication: Prolonged ileus  Patient Measurements: Height: 5\' 6"  (167.6 cm) Weight: 93 kg (205 lb 0.4 oz) IBW/kg (Calculated) : 63.8 TPN AdjBW (KG): 72.6 Body mass index is 33.09 kg/m. Usual Weight: 85-92 kg   Assessment: Patient with history of heavy alcohol  use presented with fall on stairs, acute respiratory failure requiring mechanical ventilation, L rib fractures, L hemopneumothorax, L pulmonary contusion. Patient also developed Ileus, NGT to suction. Minimal nutrition since admission 4/25. Pharmacy consulted to dose TPN.   Glucose / Insulin: CBGs < 180, 1 unit SSI / 24 hours no history of diabetes  Electrolytes: Labs are WNL  Renal: Scr < 1.0, UOP 0.3 mL/kg/hr  Hepatic: TG 168 5/6, propofol  off after being transition to dexmedetomidine   Intake / Output; MIVF: Net IO Since Admission: 1,830.53 mL [02/06/24 0720] NGT output in mL = 100 (5/3), 150 (5/4), 150 (5/5), 250 (5/6), 100 (5/7)  GI Imaging: 5/4 KUB Findings suggest ileus  4/25 CT Chest/Ab/Pelv Stomach/Bowel: Unremarkable GI Surgeries / Procedures: n/a   Central access: PICC Line ordered 5/5  TPN start date: 5/5   Nutritional Goals: Goal TPN rate is 80 mL/hr (provides 105 g of protein and 1977 kcals per day)  RD Assessment: Estimated Needs Total Energy Estimated Needs: 1900-2100 Total Protein Estimated Needs: 105-120g Total Fluid Estimated Needs: >/=1.9L  Current Nutrition:  NPO, NGT LIWS >> clamping today and giving Mag citrate   Plan:  Continue TPN at goal rate 80 mL/hr at 1800, advance dextrose  to goal  Electrolytes in TPN: Na 50 mEq/L, K 25 mEq/L, Ca 44mEq/L, Mg 10 mEq/L, and Phos 5 mmol/L. Cl:Ac 1:1 Add standard MVI and trace elements to TPN Discontinue sliding scale insulin, only required 1 unit / 24 hours  Monitor TPN labs on Mon/Thurs Thiamine  100 mg IV daily x7 days  Stop enteral mutlivitamin for now, resume when enteral nutrition is resumed    Jason Rojas 02/06/2024,7:18 AM

## 2024-02-06 NOTE — Plan of Care (Signed)

## 2024-02-06 NOTE — Progress Notes (Signed)
 Patient ID: Jason Rojas, male   DOB: [birth year], 63 y.o.   MRN: [id number] Follow up - Trauma Critical Care   Patient Details:    Jason Rojas is an 63 y.o. male.  Lines/tubes : Airway 7.5 mm (Active)  Secured at (cm) 23 cm 02/06/24 0434  Measured From Lips 02/06/24 0434  Secured Location Left 02/06/24 0434  Secured By [employer] 02/06/24 0434  Bite Block No 02/06/24 0434  Tube Holder Repositioned Yes 02/06/24 0434  Prone position No 02/06/24 0434  Cuff Pressure (cm H2O) Green OR 18-26 CmH2O 02/05/24 2005  Site Condition Dry 02/06/24 0434     PICC Triple Lumen 02/03/24 Right Brachial 41 cm 0 cm (Active)  Indication for Insertion or Continuance of Line Administration of hyperosmolar/irritating solutions (i.e. TPN, Vancomycin, etc.) 02/05/24 2100  Exposed Catheter (cm) 0 cm 02/03/24 1723  Site Assessment Clean, Dry, Intact 02/05/24 2100  Lumen #1 Status Flushed 02/05/24 2100  Lumen #2 Status Flushed;Saline locked 02/05/24 2100  Lumen #3 Status Flushed;Saline locked 02/05/24 2100  Dressing Type Transparent 02/05/24 2100  Dressing Status Antimicrobial disc/dressing in place 02/05/24 2100  Line Care Connections checked and tightened 02/04/24 2000  Line Adjustment (NICU/IV Team Only) No 02/03/24 1723  Dressing Intervention New dressing;Adhesive placed at insertion site (IV team only) 02/03/24 1723  Dressing Change Due 02/10/24 02/05/24 2100     NG/OG Vented/Dual Lumen Oral (Active)  Tube Position (Required) External length of tube 02/05/24 2000  Ongoing Placement Verification (Required) (See row information) Yes 02/05/24 2000  Site Assessment Clean, Dry, Intact 02/05/24 2000  Interventions Cleansed 02/05/24 2000  Status Low intermittent suction 02/05/24 2000  Amount of suction 78 mmHg 02/03/24 0848  Drainage Appearance Green;Thin 02/05/24 2000  Intake (mL) 50 mL 02/06/24 0600  Output (mL) 50 mL 02/06/24 0600     Urethral Catheter [name] Latex 16 Fr. (Active)   Indication for Insertion or Continuance of Catheter Acute urinary retention (I&O Cath for 24 hrs prior to catheter insertion- Inpatient Only) 02/05/24 2000  Site Assessment Clean, Dry, Intact 02/05/24 2000  Catheter Maintenance Bag below level of bladder;Catheter secured;Drainage bag/tubing not touching floor;Insertion date on drainage bag;No dependent loops;Seal intact;Bag emptied prior to transport 02/05/24 2000  Collection Container Standard drainage bag 02/05/24 2000  Securement Method Adhesive securement device 02/05/24 2000  Urinary Catheter Interventions (if applicable) Unclamped 02/05/24 2000  Output (mL) 400 mL 02/06/24 0600    Microbiology/Sepsis markers: Results for orders placed or performed during the hospital encounter of 01/24/24  MRSA Next Gen by PCR, Nasal     Status: None   Collection Time: 01/24/24  6:25 PM   Specimen: Nasal Mucosa; Nasal Swab  Result Value Ref Range Status   MRSA by PCR Next Gen NOT DETECTED NOT DETECTED Final    Comment: (NOTE) The GeneXpert MRSA Assay (FDA approved for NASAL specimens only), is one component of a comprehensive MRSA colonization surveillance program. It is not intended to diagnose MRSA infection nor to guide or monitor treatment for MRSA infections. Test performance is not FDA approved in patients less than 28 years old. Performed at [facility], [address], [place], [place] [address]   Culture, Respiratory w Gram Stain     Status: None   Collection Time: 02/01/24  6:32 AM   Specimen: Tracheal Aspirate; Respiratory  Result Value Ref Range Status   Specimen Description TRACHEAL ASPIRATE  Final   Special Requests NONE  Final   Gram Stain  Final    RARE SQUAMOUS EPITHELIAL CELLS PRESENT WBC PRESENT, PREDOMINANTLY PMN ABUNDANT GRAM POSITIVE COCCI IN PAIRS IN CHAINS IN CLUSTERS Performed at [facility], [address], [place], [place] [address]    Culture MODERATE STREPTOCOCCUS PNEUMONIAE  Final   Report  Status 02/03/2024 FINAL  Final   Organism ID, Bacteria STREPTOCOCCUS PNEUMONIAE  Final      Susceptibility   Streptococcus pneumoniae - MIC*    ERYTHROMYCIN >=8 RESISTANT Resistant     LEVOFLOXACIN 0.5 SENSITIVE Sensitive     VANCOMYCIN 0.5 SENSITIVE Sensitive     PENICILLIN (meningitis) 0.25 RESISTANT Resistant     PENO - penicillin 0.25      PENICILLIN (non-meningitis) 0.25 SENSITIVE Sensitive     PENICILLIN (oral) 0.25 INTERMEDIATE Intermediate     CEFTRIAXONE  (non-meningitis) 0.25 SENSITIVE Sensitive     CEFTRIAXONE  (meningitis) 0.25 SENSITIVE Sensitive     * MODERATE STREPTOCOCCUS PNEUMONIAE    Anti-infectives:  Anti-infectives (From admission, onward)    Start     Dose/Rate Route Frequency Ordered Stop   02/03/24 1000  cefTRIAXone  (ROCEPHIN ) 2 g in sodium chloride  0.9 % 100 mL IVPB        2 g 200 mL/hr over 30 Minutes Intravenous Every 24 hours 02/03/24 0818 02/09/24 0959   02/02/24 0930  ceFEPIme (MAXIPIME) 2 g in sodium chloride  0.9 % 100 mL IVPB  Status:  Discontinued        2 g 200 mL/hr over 30 Minutes Intravenous Every 8 hours 02/02/24 0835 02/03/24 0818   01/24/24 0900  ceFAZolin  (ANCEF ) IVPB 2g/100 mL premix        2 g 200 mL/hr over 30 Minutes Intravenous STAT 01/24/24 0854 01/24/24 0941      Consults: Treatment Team:  Md, Trauma, MD    Studies:    Events:  Subjective:    Overnight Issues: BM yesterday, not much out OGT  Objective:  Vital signs for last 24 hours: Temp:  [97.1 F (36.2 C)-98.3 F (36.8 C)] 98.3 F (36.8 C) (05/07 2300) Pulse Rate:  [55-72] 64 (05/08 0600) Resp:  [16-32] 16 (05/08 0600) BP: (117-143)/(65-91) 132/72 (05/08 0600) SpO2:  [93 %-99 %] 98 % (05/08 0600) FiO2 (%):  [40 %] 40 % (05/08 0434) Weight:  [93 kg] 93 kg (05/08 0500)  Hemodynamic parameters for last 24 hours:    Intake/Output from previous day: 05/07 0701 - 05/08 0700 In: 2322.3 [I.V.:2122.3; NG/GT:100; IV Piggyback:100] Out: 825 [Urine:725; Emesis/NG  output:100]  Intake/Output this shift: No intake/output data recorded.  Vent settings for last 24 hours: Vent Mode: PRVC FiO2 (%):  [40 %] 40 % Set Rate:  [18 bmp] 18 bmp Vt Set:  [510 mL] 510 mL PEEP:  [5 cmH20] 5 cmH20 Plateau Pressure:  [18 cmH20-24 cmH20] 18 cmH20  Physical Exam:  General: on vent Neuro: sedated but arouses HEENT/Neck: ETT Resp: clear to auscultation bilaterally CVS: RRR GI: softer, mod dist, NT Extremities: no sig edema  Results for orders placed or performed during the hospital encounter of 01/24/24 (from the past 24 hours)  Glucose, capillary     Status: Abnormal   Collection Time: 02/05/24  8:11 AM  Result Value Ref Range   Glucose-Capillary 134 (H) 70 - 99 mg/dL  Glucose, capillary     Status: Abnormal   Collection Time: 02/05/24 11:47 AM  Result Value Ref Range   Glucose-Capillary 150 (H) 70 - 99 mg/dL  Glucose, capillary     Status: Abnormal   Collection  Time: 02/05/24  3:47 PM  Result Value Ref Range   Glucose-Capillary 128 (H) 70 - 99 mg/dL  Glucose, capillary     Status: Abnormal   Collection Time: 02/05/24  7:42 PM  Result Value Ref Range   Glucose-Capillary 160 (H) 70 - 99 mg/dL  Glucose, capillary     Status: Abnormal   Collection Time: 02/05/24 11:00 PM  Result Value Ref Range   Glucose-Capillary 142 (H) 70 - 99 mg/dL  Glucose, capillary     Status: Abnormal   Collection Time: 02/06/24  3:41 AM  Result Value Ref Range   Glucose-Capillary 119 (H) 70 - 99 mg/dL  CBC     Status: Abnormal   Collection Time: 02/06/24  4:48 AM  Result Value Ref Range   WBC 8.3 4.0 - 10.5 K/uL   RBC 3.98 (L) 4.22 - 5.81 MIL/uL   Hemoglobin 10.8 (L) 13.0 - 17.0 g/dL   HCT 16.1 (L) 09.6 - 04.5 %   MCV 85.7 80.0 - 100.0 fL   MCH 27.1 26.0 - 34.0 pg   MCHC 31.7 30.0 - 36.0 g/dL   RDW 40.9 81.1 - 91.4 %   Platelets 343 150 - 400 K/uL   nRBC 0.0 0.0 - 0.2 %  Magnesium      Status: None   Collection Time: 02/06/24  4:48 AM  Result Value Ref Range    Magnesium  2.0 1.7 - 2.4 mg/dL  Phosphorus     Status: None   Collection Time: 02/06/24  4:48 AM  Result Value Ref Range   Phosphorus 4.6 2.5 - 4.6 mg/dL  Comprehensive metabolic panel     Status: Abnormal   Collection Time: 02/06/24  4:48 AM  Result Value Ref Range   Sodium 142 135 - 145 mmol/L   Potassium 4.5 3.5 - 5.1 mmol/L   Chloride 108 98 - 111 mmol/L   CO2 24 22 - 32 mmol/L   Glucose, Bld 121 (H) 70 - 99 mg/dL   BUN 20 8 - 23 mg/dL   Creatinine, Ser 7.82 (L) 0.61 - 1.24 mg/dL   Calcium 9.4 8.9 - 95.6 mg/dL   Total Protein 6.6 6.5 - 8.1 g/dL   Albumin 2.4 (L) 3.5 - 5.0 g/dL   AST 21 15 - 41 U/L   ALT 16 0 - 44 U/L   Alkaline Phosphatase 79 38 - 126 U/L   Total Bilirubin <0.2 0.0 - 1.2 mg/dL   GFR, Estimated >21 >30 mL/min   Anion gap 10 5 - 15    Assessment & Plan: Present on Admission:  Fall    LOS: 13 days   Additional comments:I reviewed the patient's new clinical lab test results. 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 On ADT w/ Zytiga/prednisone  daily at home. Hemorrhoids FEN - precedex , TNA, ileus improving, try clamp OGT and give Mg citrate VTE - SCD's, LMWH ID - Rocephin  for strep pneumo PNA, plan 7d Dispo - ICU Critical Care Total  Time*: 35 Minutes  Dorena Gander, MD, MPH, FACS Trauma & General Surgery Use [email or web address] to contact on call provider  02/06/2024  *Care during the described time interval was provided by me.  I have reviewed this patient's available data, including medical history, events of note, physical examination and test results as part of my evaluation.

## 2024-02-07 LAB — GLUCOSE, CAPILLARY
Glucose-Capillary: 128 mg/dL — ABNORMAL HIGH (ref 70–99)
Glucose-Capillary: 127 mg/dL — ABNORMAL HIGH (ref 70–99)
Glucose-Capillary: 135 mg/dL — ABNORMAL HIGH (ref 70–99)
Glucose-Capillary: 147 mg/dL — ABNORMAL HIGH (ref 70–99)

## 2024-02-07 LAB — CBC
HCT: 31.5 % — ABNORMAL LOW (ref 39.0–52.0)
Hemoglobin: 9.9 g/dL — ABNORMAL LOW (ref 13.0–17.0)
MCH: 27.1 pg (ref 26.0–34.0)
MCHC: 31.4 g/dL (ref 30.0–36.0)
MCV: 86.3 fL (ref 80.0–100.0)
Platelets: 312 10*3/uL (ref 150–400)
RBC: 3.65 MIL/uL — ABNORMAL LOW (ref 4.22–5.81)
RDW: 14.6 % (ref 11.5–15.5)
WBC: 8.6 10*3/uL (ref 4.0–10.5)
nRBC: 0 % (ref 0.0–0.2)

## 2024-02-07 LAB — BASIC METABOLIC PANEL WITH GFR
Anion gap: 9 (ref 5–15)
BUN: 21 mg/dL (ref 8–23)
CO2: 25 mmol/L (ref 22–32)
Calcium: 9.2 mg/dL (ref 8.9–10.3)
Chloride: 106 mmol/L (ref 98–111)
Creatinine, Ser: 0.41 mg/dL — ABNORMAL LOW (ref 0.61–1.24)
GFR, Estimated: >60 mL/min (ref >60)
Glucose, Bld: 123 mg/dL — ABNORMAL HIGH (ref 70–99)
Potassium: 4.4 mmol/L (ref 3.5–5.1)
Sodium: 140 mmol/L (ref 135–145)

## 2024-02-07 MED ORDER — TRAVASOL 10 % IV SOLN
INTRAVENOUS | Status: AC
  Filled 2024-02-07: qty 1056

## 2024-02-07 MED ORDER — BETHANECHOL CHLORIDE 25 MG PO TABS
25.0000 mg | ORAL_TABLET | Freq: Three times a day (TID) | ORAL | Status: DC
  Administered 2024-02-07 – 2024-02-20 (×41): 25 mg
  Filled 2024-02-07: qty 3
  Filled 2024-02-07: qty 2.5
  Filled 2024-02-07 (×3): qty 1
  Filled 2024-02-07: qty 3
  Filled 2024-02-07: qty 1
  Filled 2024-02-07: qty 3
  Filled 2024-02-07 (×2): qty 1
  Filled 2024-02-07: qty 2.5
  Filled 2024-02-07: qty 3
  Filled 2024-02-07 (×2): qty 1
  Filled 2024-02-07 (×2): qty 3
  Filled 2024-02-07: qty 1
  Filled 2024-02-07: qty 3
  Filled 2024-02-07 (×2): qty 2.5
  Filled 2024-02-07 (×3): qty 1
  Filled 2024-02-07: qty 3
  Filled 2024-02-07: qty 2.5
  Filled 2024-02-07 (×3): qty 1
  Filled 2024-02-07: qty 3
  Filled 2024-02-07: qty 1
  Filled 2024-02-07 (×2): qty 2.5
  Filled 2024-02-07 (×3): qty 1
  Filled 2024-02-07 (×2): qty 3
  Filled 2024-02-07: qty 1
  Filled 2024-02-07 (×4): qty 3
  Filled 2024-02-07: qty 1

## 2024-02-07 NOTE — Progress Notes (Signed)
 Placed back on full support (PRVC) after about 2 hours of pressure support. Patient's resp rate went to high 40's and tidal volumes fell to the mid 300s. Increasing pressure support did not alleviate this. Upon returning to full support, patient seemed to "calm down" and his resp rate returned to within acceptable limits. Also, RN notified RT there was a significant cuff leak after patient was repositioned, RT checked ETT placement and added air to cuff which resolved the issue.

## 2024-02-07 NOTE — Progress Notes (Signed)
 PHARMACY - TOTAL PARENTERAL NUTRITION CONSULT NOTE   Indication: Prolonged ileus  Patient Measurements: Height: 5\' 6"  (167.6 cm) Weight: 93 kg (205 lb 0.4 oz) IBW/kg (Calculated) : 63.8 TPN AdjBW (KG): 72.6 Body mass index is 33.09 kg/m. Usual Weight: 85-92 kg   Assessment: Patient with history of heavy alcohol  use presented with fall on stairs, acute respiratory failure requiring mechanical ventilation, L rib fractures, L hemopneumothorax, L pulmonary contusion. Patient also developed Ileus, NGT to suction. Minimal nutrition since admission 4/25. Pharmacy consulted to dose TPN.   Glucose / Insulin : CBGs < 180 no history of diabetes  Electrolytes: Labs are WNL  Renal: Scr < 1.0, UOP 0.5 mL/kg/hr  Hepatic: TG 168 5/6, propofol  off after being transition to dexmedetomidine   Intake / Output; MIVF: Net IO Since Admission: 2,962.24 mL [02/07/24 0715] NGT output in mL = 100 (5/3), 150 (5/4), 150 (5/5), 250 (5/6), 100 (5/7), 50 (5/8)  GI Imaging: 5/4 KUB Findings suggest ileus  4/25 CT Chest/Ab/Pelv Stomach/Bowel: Unremarkable GI Surgeries / Procedures: n/a   Central access: PICC Line ordered 5/5  TPN start date: 5/5   Nutritional Goals: Goal TPN rate is 80 mL/hr (provides 105 g of protein and 1977 kcals per day)  RD Assessment: Estimated Needs Total Energy Estimated Needs: 1900-2100 Total Protein Estimated Needs: 105-120g Total Fluid Estimated Needs: >/=1.9L  Current Nutrition:  NPO, NGT LIWS >> clamped 5/8  No tube feeds today per conversation with MD, possibly tomorrow   Plan:  Continue TPN at goal rate 80 mL/hr  Electrolytes in TPN: Na 50 mEq/L, K 25 mEq/L, Ca 24mEq/L, Mg 10 mEq/L, and Phos 5 mmol/L. Cl:Ac 1:1 Standard MVI and trace elements in TPN Monitor TPN labs on Mon/Thurs Thiamine  100 mg IV daily x7 days  Hold enteral mutlivitamin for now, resume when enteral nutrition is resumed   [employer] 02/07/2024,7:15 AM

## 2024-02-07 NOTE — Progress Notes (Signed)
 Patient ID: Jason Rojas, male   DOB: 1961-09-22, 63 y.o.   MRN: [id number] Follow up - Trauma Critical Care   Patient Details:    Jason Rojas is an 63 y.o. male.  Lines/tubes : Airway 7.5 mm (Active)  Secured at (cm) 23 cm 02/07/24 0804  Measured From Lips 02/07/24 0804  Secured Location Left 02/07/24 0804  Secured By [employer] 02/07/24 0804  Bite Block No 02/07/24 0804  Tube Holder Repositioned Yes 02/07/24 0804  Prone position No 02/07/24 0804  Cuff Pressure (cm H2O) MOV (Manual Technique) 02/07/24 0804  Site Condition Cool;Dry 02/07/24 0804     PICC Triple Lumen 02/03/24 Right Brachial 41 cm 0 cm (Active)  Indication for Insertion or Continuance of Line Administration of hyperosmolar/irritating solutions (i.e. TPN, Vancomycin, etc.) 02/06/24 0800  Exposed Catheter (cm) 0 cm 02/03/24 1723  Site Assessment Clean, Dry, Intact 02/06/24 1930  Lumen #1 Status Flushed 02/06/24 1930  Lumen #2 Status Flushed;Saline locked 02/06/24 1930  Lumen #3 Status Flushed;Saline locked 02/06/24 1930  Dressing Type Transparent 02/06/24 1930  Dressing Status Antimicrobial disc/dressing in place 02/06/24 1930  Line Care Connections checked and tightened 02/04/24 2000  Line Adjustment (NICU/IV Team Only) No 02/03/24 1723  Dressing Intervention New dressing;Adhesive placed at insertion site (IV team only) 02/03/24 1723  Dressing Change Due 02/10/24 02/06/24 1930     NG/OG Vented/Dual Lumen Oral (Active)  Tube Position (Required) External length of tube 02/06/24 2000  Ongoing Placement Verification (Required) (See row information) Yes 02/06/24 2000  Site Assessment Clean, Dry, Intact 02/06/24 2000  Interventions Cleansed 02/05/24 2000  Status Low intermittent suction 02/06/24 2000  Amount of suction 78 mmHg 02/03/24 0848  Drainage Appearance Green 02/06/24 2000  Intake (mL) 50 mL 02/06/24 0600  Output (mL) 50 mL 02/06/24 0937     Urethral Catheter [name] Latex 16 Fr. (Active)   Indication for Insertion or Continuance of Catheter Acute urinary retention (I&O Cath for 24 hrs prior to catheter insertion- Inpatient Only) 02/06/24 2000  Site Assessment Clean, Dry, Intact 02/06/24 2000  Catheter Maintenance Bag below level of bladder;Catheter secured;Drainage bag/tubing not touching floor;Insertion date on drainage bag 02/06/24 2000  Collection Container Standard drainage bag 02/06/24 2000  Securement Method Adhesive securement device 02/06/24 2000  Urinary Catheter Interventions (if applicable) Unclamped 02/05/24 2000  Output (mL) 250 mL 02/07/24 0600    Microbiology/Sepsis markers: Results for orders placed or performed during the hospital encounter of 01/24/24  MRSA Next Gen by PCR, Nasal     Status: None   Collection Time: 01/24/24  6:25 PM   Specimen: Nasal Mucosa; Nasal Swab  Result Value Ref Range Status   MRSA by PCR Next Gen NOT DETECTED NOT DETECTED Final    Comment: (NOTE) The GeneXpert MRSA Assay (FDA approved for NASAL specimens only), is one component of a comprehensive MRSA colonization surveillance program. It is not intended to diagnose MRSA infection nor to guide or monitor treatment for MRSA infections. Test performance is not FDA approved in patients less than 109 years old. Performed at [facility], [address], [place], [place] [address]   Culture, Respiratory w Gram Stain     Status: None   Collection Time: 02/01/24  6:32 AM   Specimen: Tracheal Aspirate; Respiratory  Result Value Ref Range Status   Specimen Description TRACHEAL ASPIRATE  Final   Special Requests NONE  Final   Gram Stain   Final    RARE SQUAMOUS EPITHELIAL  CELLS PRESENT WBC PRESENT, PREDOMINANTLY PMN ABUNDANT GRAM POSITIVE COCCI IN PAIRS IN CHAINS IN CLUSTERS Performed at [facility], [address] [place], [place] [address]    Culture MODERATE STREPTOCOCCUS PNEUMONIAE  Final   Report Status 02/03/2024 FINAL  Final   Organism ID, Bacteria  STREPTOCOCCUS PNEUMONIAE  Final      Susceptibility   Streptococcus pneumoniae - MIC*    ERYTHROMYCIN >=8 RESISTANT Resistant     LEVOFLOXACIN 0.5 SENSITIVE Sensitive     VANCOMYCIN 0.5 SENSITIVE Sensitive     PENICILLIN (meningitis) 0.25 RESISTANT Resistant     PENO - penicillin 0.25      PENICILLIN (non-meningitis) 0.25 SENSITIVE Sensitive     PENICILLIN (oral) 0.25 INTERMEDIATE Intermediate     CEFTRIAXONE  (non-meningitis) 0.25 SENSITIVE Sensitive     CEFTRIAXONE  (meningitis) 0.25 SENSITIVE Sensitive     * MODERATE STREPTOCOCCUS PNEUMONIAE    Anti-infectives:  Anti-infectives (From admission, onward)    Start     Dose/Rate Route Frequency Ordered Stop   02/03/24 1000  cefTRIAXone  (ROCEPHIN ) 2 g in sodium chloride  0.9 % 100 mL IVPB        2 g 200 mL/hr over 30 Minutes Intravenous Every 24 hours 02/03/24 0818 02/09/24 0959   02/02/24 0930  ceFEPIme  (MAXIPIME ) 2 g in sodium chloride  0.9 % 100 mL IVPB  Status:  Discontinued        2 g 200 mL/hr over 30 Minutes Intravenous Every 8 hours 02/02/24 0835 02/03/24 0818   01/24/24 0900  ceFAZolin  (ANCEF ) IVPB 2g/100 mL premix        2 g 200 mL/hr over 30 Minutes Intravenous STAT 01/24/24 0854 01/24/24 0941       Consults: Treatment Team:  Md, Trauma, MD    Studies:    Events:  Subjective:    Overnight Issues:  2 BM, OGT placed back on LIWS Objective:  Vital signs for last 24 hours: Temp:  [96.7 F (35.9 C)-98.5 F (36.9 C)] 97.9 F (36.6 C) (05/09 0744) Pulse Rate:  [64-82] 69 (05/09 0804) Resp:  [18-26] 18 (05/09 0804) BP: (100-141)/(62-74) 128/69 (05/09 0500) SpO2:  [95 %-100 %] 98 % (05/09 0804) FiO2 (%):  [40 %] 40 % (05/09 0804)  Hemodynamic parameters for last 24 hours:    Intake/Output from previous day: 05/08 0701 - 05/09 0700 In: 2331.7 [I.V.:2231.7; IV Piggyback:100] Out: 1200 [Urine:1150; Emesis/NG output:50]  Intake/Output this shift: No intake/output data recorded.  Vent settings for last 24  hours: Vent Mode: CPAP;PSV FiO2 (%):  [40 %] 40 % Set Rate:  [18 bmp] 18 bmp Vt Set:  [510 mL] 510 mL PEEP:  [5 cmH20] 5 cmH20 Pressure Support:  [15 cmH20] 15 cmH20 Plateau Pressure:  [18 cmH20-22 cmH20] 22 cmH20  Physical Exam:  General: on vent Neuro: F/C HEENT/Neck: ETT Resp: clear to auscultation bilaterally CVS: RRR GI: some distention still but soft , NT Extremities: calves soft  Results for orders placed or performed during the hospital encounter of 01/24/24 (from the past 24 hours)  Glucose, capillary     Status: Abnormal   Collection Time: 02/06/24 11:14 AM  Result Value Ref Range   Glucose-Capillary 118 (H) 70 - 99 mg/dL  Glucose, capillary     Status: Abnormal   Collection Time: 02/06/24  3:31 PM  Result Value Ref Range   Glucose-Capillary 109 (H) 70 - 99 mg/dL  Glucose, capillary     Status: Abnormal   Collection Time: 02/06/24  7:39 PM  Result  Value Ref Range   Glucose-Capillary 131 (H) 70 - 99 mg/dL  Glucose, capillary     Status: Abnormal   Collection Time: 02/06/24 11:48 PM  Result Value Ref Range   Glucose-Capillary 125 (H) 70 - 99 mg/dL  CBC     Status: Abnormal   Collection Time: 02/07/24  3:22 AM  Result Value Ref Range   WBC 8.6 4.0 - 10.5 K/uL   RBC 3.65 (L) 4.22 - 5.81 MIL/uL   Hemoglobin 9.9 (L) 13.0 - 17.0 g/dL   HCT 16.1 (L) 09.6 - 04.5 %   MCV 86.3 80.0 - 100.0 fL   MCH 27.1 26.0 - 34.0 pg   MCHC 31.4 30.0 - 36.0 g/dL   RDW 40.9 81.1 - 91.4 %   Platelets 312 150 - 400 K/uL   nRBC 0.0 0.0 - 0.2 %  Basic metabolic panel with GFR     Status: Abnormal   Collection Time: 02/07/24  3:22 AM  Result Value Ref Range   Sodium 140 135 - 145 mmol/L   Potassium 4.4 3.5 - 5.1 mmol/L   Chloride 106 98 - 111 mmol/L   CO2 25 22 - 32 mmol/L   Glucose, Bld 123 (H) 70 - 99 mg/dL   BUN 21 8 - 23 mg/dL   Creatinine, Ser 7.82 (L) 0.61 - 1.24 mg/dL   Calcium 9.2 8.9 - 95.6 mg/dL   GFR, Estimated >21 >30 mL/min   Anion gap 9 5 - 15  Glucose, capillary      Status: Abnormal   Collection Time: 02/07/24  3:30 AM  Result Value Ref Range   Glucose-Capillary 128 (H) 70 - 99 mg/dL  Glucose, capillary     Status: Abnormal   Collection Time: 02/07/24  7:59 AM  Result Value Ref Range   Glucose-Capillary 127 (H) 70 - 99 mg/dL    Assessment & Plan: Present on Admission:  Fall    LOS: 14 days   Additional comments:I reviewed the patient's new clinical lab test results. 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 On ADT w/ Zytiga/prednisone  daily at home - will resume once can take PO (cannot be given per tube) Hemorrhoids Acute urinary retention - foley, urecholine, TOV 5/11 FEN - precedex , TNA, ileus improving some with 2 BM, clamp OGT VTE - SCD's, LMWH ID - Rocephin  for strep pneumo PNA, plan 7d Dispo - ICU I updated his daughter on the phone. Critical Care Total Time*: 33 Minutes  Dorena Gander, MD, MPH, FACS Trauma & General Surgery Use [email or web address] to contact on call provider  02/07/2024  *Care during the described time interval was provided by me. I have reviewed this patient's available data, including medical history, events of note, physical examination and test results as part of my evaluation.

## 2024-02-08 LAB — BASIC METABOLIC PANEL WITH GFR
Anion gap: 8 (ref 5–15)
BUN: 20 mg/dL (ref 8–23)
CO2: 26 mmol/L (ref 22–32)
Calcium: 9.2 mg/dL (ref 8.9–10.3)
Chloride: 106 mmol/L (ref 98–111)
Creatinine, Ser: 0.46 mg/dL — ABNORMAL LOW (ref 0.61–1.24)
GFR, Estimated: >60 mL/min (ref >60)
Glucose, Bld: 123 mg/dL — ABNORMAL HIGH (ref 70–99)
Potassium: 4.2 mmol/L (ref 3.5–5.1)
Sodium: 140 mmol/L (ref 135–145)

## 2024-02-08 LAB — CBC
HCT: 31.9 % — ABNORMAL LOW (ref 39.0–52.0)
Hemoglobin: 10 g/dL — ABNORMAL LOW (ref 13.0–17.0)
MCH: 26.9 pg (ref 26.0–34.0)
MCHC: 31.3 g/dL (ref 30.0–36.0)
MCV: 85.8 fL (ref 80.0–100.0)
Platelets: 311 10*3/uL (ref 150–400)
RBC: 3.72 MIL/uL — ABNORMAL LOW (ref 4.22–5.81)
RDW: 14.3 % (ref 11.5–15.5)
WBC: 9.3 10*3/uL (ref 4.0–10.5)
nRBC: 0 % (ref 0.0–0.2)

## 2024-02-08 LAB — GLUCOSE, CAPILLARY
Glucose-Capillary: 128 mg/dL — ABNORMAL HIGH (ref 70–99)
Glucose-Capillary: 131 mg/dL — ABNORMAL HIGH (ref 70–99)
Glucose-Capillary: 150 mg/dL — ABNORMAL HIGH (ref 70–99)
Glucose-Capillary: 171 mg/dL — ABNORMAL HIGH (ref 70–99)

## 2024-02-08 MED ORDER — TRAVASOL 10 % IV SOLN
INTRAVENOUS | Status: AC
  Filled 2024-02-08: qty 1056

## 2024-02-08 NOTE — Progress Notes (Signed)
 PHARMACY - TOTAL PARENTERAL NUTRITION CONSULT NOTE   Indication: Prolonged ileus  Patient Measurements: Height: 5\' 6"  (167.6 cm) Weight: 91.5 kg (201 lb 11.5 oz) IBW/kg (Calculated) : 63.8 TPN AdjBW (KG): 72.6 Body mass index is 32.56 kg/m. Usual Weight: 85-92 kg   Assessment: Patient with history of heavy alcohol  use presented with fall on stairs, acute respiratory failure requiring mechanical ventilation, L rib fractures, L hemopneumothorax, L pulmonary contusion. Patient also developed Ileus, NGT to suction. Minimal nutrition since admission 4/25. Pharmacy consulted to dose TPN.   Clamping trial 5/9 successful, per RN seems a bit more distended than yesterday, but has active bowel sounds. No emesis, having type 7 stools  Glucose / Insulin : CBGs 127-147, no history of diabetes, no insulin  needs Electrolytes: Labs are WNL  Renal: Scr < 1.0 Hepatic: TG 168 5/6, propofol  off after being transition to dexmedetomidine   Intake / Output; MIVF: No emesis/NGT output, 230 mL stool (type 7 x2), with UOP 0.5 mL/kg/h (total 1.1 L) Net + 4.4L  GI Imaging:  5/4 KUB Findings suggest ileus  4/25 CT Chest/Ab/Pelv Stomach/Bowel: Unremarkable GI Surgeries / Procedures: n/a   Central access: PICC Line ordered 5/5  TPN start date: 5/5   Nutritional Goals: Goal TPN rate is 80 mL/hr (provides 105 g of protein and 1977 kcals per day)  RD Assessment: Estimated Needs Total Energy Estimated Needs: 1900-2100 Total Protein Estimated Needs: 105-120g Total Fluid Estimated Needs: >/=1.9L  Current Nutrition:  NPO, NGT LIWS >> clamped 5/8  No tube feeds today per conversation with MD, possibly tomorrow   Plan:  Continue TPN at goal rate 80 mL/hr providing 100% of goal needs Electrolytes in TPN: Na 50 mEq/L, K 25 mEq/L, Ca 60mEq/L, Mg 10 mEq/L, and Phos 5 mmol/L. Cl:Ac 1:1 Standard MVI and trace elements in TPN Monitor TPN labs on Mon/Thurs Thiamine  100 mg IV daily x7 days (5/5 - 5/11) Hold enteral  mutlivitamin for now, resume when enteral nutrition is resumed  F/u initiation and advancement of TF  Heddy Liverpool, PharmD PGY2 Critical Care Pharmacy Resident 02/08/2024,8:21 AM

## 2024-02-08 NOTE — Progress Notes (Addendum)
    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 On ADT w/ Zytiga/prednisone  daily at home - will resume once can take PO (cannot be given per tube) Acute urinary retention  - foley, urecholine, TOV 5/11  FEN - TNA VTE - SCD's, LMWH ID - Rocephin  for strep pneumo PNA, plan 7d Dispo - ICU        [name], MD [facility] practice Office: [phone]        Chief Complaint: Fall on stairs, rib fractures  Subjective: Patient in ICU, on vent, sedated  Objective: Vital signs in last 24 hours: Temp:  [98.1 F (36.7 C)-99.3 F (37.4 C)] 98.3 F (36.8 C) (05/10 0728) Pulse Rate:  [69-88] 73 (05/10 0815) Resp:  [19-30] 19 (05/10 0815) BP: (109-144)/(58-102) 137/81 (05/10 0500) SpO2:  [95 %-100 %] 97 % (05/10 0815) FiO2 (%):  [40 %] 40 % (05/10 0815) Weight:  [91.5 kg] 91.5 kg (05/10 0500) Last BM Date : 02/07/24  Intake/Output from previous day: 05/09 0701 - 05/10 0700 In: 2512.1 [I.V.:2142.2; NG/GT:270; IV Piggyback:99.9] Out: 1365 [Urine:1135; Stool:230] Intake/Output this shift: No intake/output data recorded.  Physical Exam: HEENT - mucous membranes moist, moderate secretions Neck - soft Abdomen - protuberant, firm  Lab Results:  Recent Labs    02/07/24 0322 02/08/24 0430  WBC 8.6 9.3  HGB 9.9* 10.0*  HCT 31.5* 31.9*  PLT 312 311   BMET Recent Labs    02/07/24 0322 02/08/24 0430  NA 140 140  K 4.4 4.2  CL 106 106  CO2 25 26  GLUCOSE 123* 123*  BUN 21 20  CREATININE 0.41* 0.46*  CALCIUM 9.2  9.2   PT/INR No results for input(s): "LABPROT", "INR" in the last 72 hours. Comprehensive Metabolic Panel:    Component Value Date/Time   NA 140 02/08/2024 0430   NA 140 02/07/2024 0322   K 4.2 02/08/2024 0430   K 4.4 02/07/2024 0322   CL 106 02/08/2024 0430   CL 106 02/07/2024 0322   CO2 26 02/08/2024 0430   CO2 25 02/07/2024 0322   BUN 20 02/08/2024 0430   BUN 21 02/07/2024 0322   CREATININE 0.46 (L) 02/08/2024 0430   CREATININE 0.41 (L) 02/07/2024 0322   GLUCOSE 123 (H) 02/08/2024 0430   GLUCOSE 123 (H) 02/07/2024 0322   CALCIUM 9.2 02/08/2024 0430   CALCIUM 9.2 02/07/2024 0322   AST 21 02/06/2024 0448   AST 23 02/04/2024 0830   ALT 16 02/06/2024 0448   ALT 15 02/04/2024 0830   ALKPHOS 79 02/06/2024 0448   ALKPHOS 80 02/04/2024 0830   BILITOT <0.2 02/06/2024 0448   BILITOT 0.3 02/04/2024 0830   PROT 6.6 02/06/2024 0448   PROT 6.8 02/04/2024 0830   ALBUMIN  2.4 (L) 02/06/2024 0448   ALBUMIN  2.3 (L) 02/05/2024 0326    Studies/Results: No results found.    [name] 02/08/2024  Patient ID: Jason Rojas, male   DOB: 12/18/1960, 63 y.o.   MRN: [id number]

## 2024-02-09 LAB — BASIC METABOLIC PANEL WITH GFR
Anion gap: 7 (ref 5–15)
BUN: 16 mg/dL (ref 8–23)
CO2: 24 mmol/L (ref 22–32)
Calcium: 8.7 mg/dL — ABNORMAL LOW (ref 8.9–10.3)
Chloride: 108 mmol/L (ref 98–111)
Creatinine, Ser: 0.41 mg/dL — ABNORMAL LOW (ref 0.61–1.24)
GFR, Estimated: >60 mL/min (ref >60)
Glucose, Bld: 137 mg/dL — ABNORMAL HIGH (ref 70–99)
Potassium: 3.6 mmol/L (ref 3.5–5.1)
Sodium: 139 mmol/L (ref 135–145)

## 2024-02-09 LAB — CBC
HCT: 28.7 % — ABNORMAL LOW (ref 39.0–52.0)
Hemoglobin: 8.8 g/dL — ABNORMAL LOW (ref 13.0–17.0)
MCH: 26.5 pg (ref 26.0–34.0)
MCHC: 30.7 g/dL (ref 30.0–36.0)
MCV: 86.4 fL (ref 80.0–100.0)
Platelets: 243 10*3/uL (ref 150–400)
RBC: 3.32 MIL/uL — ABNORMAL LOW (ref 4.22–5.81)
RDW: 14.2 % (ref 11.5–15.5)
WBC: 7.6 10*3/uL (ref 4.0–10.5)
nRBC: 0 % (ref 0.0–0.2)

## 2024-02-09 LAB — GLUCOSE, CAPILLARY
Glucose-Capillary: 144 mg/dL — ABNORMAL HIGH (ref 70–99)
Glucose-Capillary: 140 mg/dL — ABNORMAL HIGH (ref 70–99)
Glucose-Capillary: 133 mg/dL — ABNORMAL HIGH (ref 70–99)

## 2024-02-09 MED ORDER — POTASSIUM ACETATE 2 MEQ/ML IV SOLN
INTRAVENOUS | Status: AC
  Filled 2024-02-09: qty 1056

## 2024-02-09 MED ORDER — POTASSIUM CHLORIDE 10 MEQ/50ML IV SOLN
10.0000 meq | INTRAVENOUS | Status: AC
  Administered 2024-02-09 (×4): 10 meq via INTRAVENOUS
  Filled 2024-02-09 (×4): qty 50

## 2024-02-09 NOTE — Progress Notes (Signed)
 PHARMACY - TOTAL PARENTERAL NUTRITION CONSULT NOTE   Indication: Prolonged ileus  Patient Measurements: Height: 5\' 6"  (167.6 cm) Weight: 93.3 kg (205 lb 11 oz) IBW/kg (Calculated) : 63.8 TPN AdjBW (KG): 72.6 Body mass index is 33.2 kg/m. Usual Weight: 85-92 kg   Assessment: Patient with history of heavy alcohol  use presented with fall on stairs, acute respiratory failure requiring mechanical ventilation, L rib fractures, L hemopneumothorax, L pulmonary contusion. Patient also developed Ileus, NGT to suction. Minimal nutrition since admission 4/25. Pharmacy consulted to dose TPN.   Clamping trial 5/9 successful, per RN seems a bit more distended than yesterday, but has active bowel sounds with type 7 stools.  Glucose / Insulin : CBGs < 150, no history of diabetes, no insulin  needs Electrolytes: K 4.2 > 3.6 (maybe 2/2 GI losses), otherwise WNL  Renal: Scr < 1.0 Hepatic: TG 168 5/6, propofol  off after being transition to dexmedetomidine   Intake / Output; MIVF: Emesis x1, 785 mL stool (type 7 x3), with UOP 0.6 mL/kg/h (total 1.4 L) Net + 5.4L  GI Imaging:  5/4 KUB Findings suggest ileus  4/25 CT Chest/Ab/Pelv Stomach/Bowel: Unremarkable GI Surgeries / Procedures: n/a   Central access: PICC Line ordered 5/5  TPN start date: 5/5   Nutritional Goals: Goal TPN rate is 80 mL/hr (provides 105 g of protein and 1977 kcals per day)  RD Assessment: Estimated Needs Total Energy Estimated Needs: 1900-2100 Total Protein Estimated Needs: 105-120g Total Fluid Estimated Needs: >/=1.9L  Current Nutrition:  NPO, NGT LIWS >> clamped 5/8   Plan:  Continue TPN at goal rate 80 mL/hr providing 100% of goal needs Electrolytes in TPN: Na 50 mEq/L, K 25 mEq/L, Ca 88mEq/L, Mg 10 mEq/L, and Phos 5 mmol/L. Cl:Ac 1:1 Supplement KCl 10 mEq IV x4 outside of TPN Consider increasing K in TPN 5/12 if no improvement  Standard MVI and trace elements in TPN Monitor TPN labs on Mon/Thurs Thiamine  100 mg IV  daily x7 days (5/5 - 5/11) Hold enteral multivitamin for now, resume when enteral nutrition is resumed  F/u initiation and advancement of TF  Heddy Liverpool, PharmD PGY2 Critical Care Pharmacy Resident 02/09/2024,7:46 AM

## 2024-02-09 NOTE — Progress Notes (Signed)
    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 On ADT w/ Zytiga/prednisone  daily at home - will resume once can take PO (cannot be given per tube) Acute urinary retention  - foley, urecholine   FEN - TNA VTE - SCD's, LMWH ID - Rocephin  for strep pneumo PNA, plan 7d Dispo - ICU        Jason Billow, MD [facility] practice Office: [phone]        Chief Complaint: Fall  Subjective: Patient awakens to voice, responsive.  No complaints.  Objective: Vital signs in last 24 hours: Temp:  [98 F (36.7 C)-99.4 F (37.4 C)] 98 F (36.7 C) (05/11 0734) Pulse Rate:  [66-88] 69 (05/11 0802) Resp:  [17-30] 22 (05/11 0802) BP: (117-165)/(64-136) 142/75 (05/11 0802) SpO2:  [97 %-100 %] 99 % (05/11 0802) FiO2 (%):  [40 %] 40 % (05/11 0802) Weight:  [93.3 kg] 93.3 kg (05/11 0150) Last BM Date : 02/08/24  Intake/Output from previous day: 05/10 0701 - 05/11 0700 In: 2943.7 [I.V.:2483.7; NG/GT:360; IV Piggyback:100] Out: 2185 [Urine:1400; Stool:785] Intake/Output this shift: No intake/output data recorded.  Physical Exam: HEENT - sclerae clear, mucous membranes moist Chest - non-tender; no crepitance Abdomen - protuberant, soft, non-tender  Lab Results:  Recent Labs    02/08/24 0430 02/09/24 0327  WBC 9.3 7.6  HGB 10.0* 8.8*  HCT 31.9* 28.7*  PLT 311 243   BMET Recent Labs    02/08/24 0430 02/09/24 0327  NA 140 139  K 4.2 3.6  CL 106 108  CO2 26 24  GLUCOSE 123* 137*  BUN 20 16  CREATININE 0.46* 0.41*   CALCIUM 9.2 8.7*   PT/INR No results for input(s): "LABPROT", "INR" in the last 72 hours. Comprehensive Metabolic Panel:    Component Value Date/Time   NA 139 02/09/2024 0327   NA 140 02/08/2024 0430   K 3.6 02/09/2024 0327   K 4.2 02/08/2024 0430   CL 108 02/09/2024 0327   CL 106 02/08/2024 0430   CO2 24 02/09/2024 0327   CO2 26 02/08/2024 0430   BUN 16 02/09/2024 0327   BUN 20 02/08/2024 0430   CREATININE 0.41 (L) 02/09/2024 0327   CREATININE 0.46 (L) 02/08/2024 0430   GLUCOSE 137 (H) 02/09/2024 0327   GLUCOSE 123 (H) 02/08/2024 0430   CALCIUM 8.7 (L) 02/09/2024 0327   CALCIUM 9.2 02/08/2024 0430   AST 21 02/06/2024 0448   AST 23 02/04/2024 0830   ALT 16 02/06/2024 0448   ALT 15 02/04/2024 0830   ALKPHOS 79 02/06/2024 0448   ALKPHOS 80 02/04/2024 0830   BILITOT <0.2 02/06/2024 0448   BILITOT 0.3 02/04/2024 0830   PROT 6.6 02/06/2024 0448   PROT 6.8 02/04/2024 0830   ALBUMIN  2.4 (L) 02/06/2024 0448   ALBUMIN  2.3 (L) 02/05/2024 0326    Studies/Results: No results found.    Jason Rojas 02/09/2024  Patient ID: Jason Rojas, male   DOB: [birth year], 63 y.o.   MRN: [id number]

## 2024-02-10 ENCOUNTER — Inpatient Hospital Stay (HOSPITAL_COMMUNITY)

## 2024-02-10 ENCOUNTER — Encounter (HOSPITAL_COMMUNITY)
Admission: EM | Disposition: A | Discharge status: Nursing Home (Includes ICF) | Payer: Self-pay | Source: Home / Self Care

## 2024-02-10 ENCOUNTER — Inpatient Hospital Stay (HOSPITAL_COMMUNITY): Admitting: Anesthesiology

## 2024-02-10 ENCOUNTER — Other Ambulatory Visit: Payer: Self-pay

## 2024-02-10 DIAGNOSIS — J449 Chronic obstructive pulmonary disease, unspecified: Secondary | ICD-10-CM | POA: Diagnosis not present

## 2024-02-10 DIAGNOSIS — I10 Essential (primary) hypertension: Secondary | ICD-10-CM | POA: Diagnosis not present

## 2024-02-10 DIAGNOSIS — F17210 Nicotine dependence, cigarettes, uncomplicated: Secondary | ICD-10-CM | POA: Diagnosis not present

## 2024-02-10 DIAGNOSIS — Z9911 Dependence on respirator [ventilator] status: Secondary | ICD-10-CM | POA: Diagnosis not present

## 2024-02-10 LAB — COMPREHENSIVE METABOLIC PANEL WITH GFR
ALT: 114 U/L — ABNORMAL HIGH (ref 0–44)
AST: 82 U/L — ABNORMAL HIGH (ref 15–41)
Albumin: 2.3 g/dL — ABNORMAL LOW (ref 3.5–5.0)
Alkaline Phosphatase: 110 U/L (ref 38–126)
Anion gap: 9 (ref 5–15)
BUN: 16 mg/dL (ref 8–23)
CO2: 22 mmol/L (ref 22–32)
Calcium: 9.3 mg/dL (ref 8.9–10.3)
Chloride: 107 mmol/L (ref 98–111)
Creatinine, Ser: 0.39 mg/dL — ABNORMAL LOW (ref 0.61–1.24)
GFR, Estimated: >60 mL/min (ref >60)
Glucose, Bld: 138 mg/dL — ABNORMAL HIGH (ref 70–99)
Potassium: 4 mmol/L (ref 3.5–5.1)
Sodium: 138 mmol/L (ref 135–145)
Total Bilirubin: 0.2 mg/dL (ref 0.0–1.2)
Total Protein: 6.3 g/dL — ABNORMAL LOW (ref 6.5–8.1)

## 2024-02-10 LAB — PHOSPHORUS: Phosphorus: 4.5 mg/dL (ref 2.5–4.6)

## 2024-02-10 LAB — MAGNESIUM: Magnesium: 1.9 mg/dL (ref 1.7–2.4)

## 2024-02-10 LAB — HEMOGLOBIN A1C
Hgb A1c MFr Bld: 5.7 % — ABNORMAL HIGH (ref 4.8–5.6)
Mean Plasma Glucose: 117 mg/dL

## 2024-02-10 LAB — TRIGLYCERIDES: Triglycerides: 106 mg/dL (ref <150)

## 2024-02-10 LAB — GLUCOSE, CAPILLARY
Glucose-Capillary: 122 mg/dL — ABNORMAL HIGH (ref 70–99)
Glucose-Capillary: 138 mg/dL — ABNORMAL HIGH (ref 70–99)
Glucose-Capillary: 178 mg/dL — ABNORMAL HIGH (ref 70–99)
Glucose-Capillary: 181 mg/dL — ABNORMAL HIGH (ref 70–99)

## 2024-02-10 SURGERY — CREATION, TRACHEOSTOMY
Anesthesia: General

## 2024-02-10 MED ORDER — DEXAMETHASONE SODIUM PHOSPHATE 10 MG/ML IJ SOLN
INTRAMUSCULAR | Status: AC
  Filled 2024-02-10: qty 1

## 2024-02-10 MED ORDER — ROCURONIUM BROMIDE 10 MG/ML (PF) SYRINGE
PREFILLED_SYRINGE | INTRAVENOUS | Status: AC
  Filled 2024-02-10: qty 10

## 2024-02-10 MED ORDER — PHENYLEPHRINE HCL-NACL 20-0.9 MG/250ML-% IV SOLN
INTRAVENOUS | Status: DC | PRN
  Administered 2024-02-10: 25 ug/min via INTRAVENOUS

## 2024-02-10 MED ORDER — INSULIN ASPART 100 UNIT/ML IJ SOLN
0.0000 [IU] | INTRAMUSCULAR | Status: DC
  Administered 2024-02-10: 2 [IU] via SUBCUTANEOUS
  Administered 2024-02-11: 3 [IU] via SUBCUTANEOUS
  Administered 2024-02-11 – 2024-02-14 (×16): 2 [IU] via SUBCUTANEOUS
  Administered 2024-02-14: 3 [IU] via SUBCUTANEOUS

## 2024-02-10 MED ORDER — TRAVASOL 10 % IV SOLN
INTRAVENOUS | Status: AC
  Filled 2024-02-10: qty 1056

## 2024-02-10 MED ORDER — MIDAZOLAM HCL 2 MG/2ML IJ SOLN
INTRAMUSCULAR | Status: DC | PRN
  Administered 2024-02-10 (×2): 1 mg via INTRAVENOUS

## 2024-02-10 MED ORDER — 0.9 % SODIUM CHLORIDE (POUR BTL) OPTIME
TOPICAL | Status: DC | PRN
  Administered 2024-02-10: 1000 mL

## 2024-02-10 MED ORDER — FENTANYL 2500MCG IN NS 250ML (10MCG/ML) PREMIX INFUSION
50.0000 ug/h | INTRAVENOUS | Status: AC
  Administered 2024-02-10: 75 ug/h via INTRAVENOUS
  Administered 2024-02-11: 150 ug/h via INTRAVENOUS
  Filled 2024-02-10: qty 250

## 2024-02-10 MED ORDER — DEXAMETHASONE SODIUM PHOSPHATE 10 MG/ML IJ SOLN
INTRAMUSCULAR | Status: DC | PRN
  Administered 2024-02-10: 10 mg via INTRAVENOUS

## 2024-02-10 MED ORDER — VITAL HIGH PROTEIN PO LIQD: 1000.0000 mL | ORAL | Status: DC

## 2024-02-10 MED ORDER — ONDANSETRON HCL 4 MG/2ML IJ SOLN
INTRAMUSCULAR | Status: AC
  Filled 2024-02-10: qty 2

## 2024-02-10 MED ORDER — FENTANYL BOLUS VIA INFUSION
50.0000 ug | INTRAVENOUS | Status: AC | PRN
Start: 2024-02-10 — End: 2024-02-11
  Administered 2024-02-10: 50 ug via INTRAVENOUS
  Administered 2024-02-10 – 2024-02-11 (×6): 100 ug via INTRAVENOUS

## 2024-02-10 MED ORDER — OSMOLITE 1.5 CAL PO LIQD
1000.0000 mL | ORAL | Status: DC
  Administered 2024-02-10 – 2024-02-11 (×2): 1000 mL
  Filled 2024-02-10: qty 1000

## 2024-02-10 MED ORDER — LACTATED RINGERS IV SOLN: INTRAVENOUS | Status: DC | PRN

## 2024-02-10 MED ORDER — MIDAZOLAM HCL 2 MG/2ML IJ SOLN
INTRAMUSCULAR | Status: AC
  Filled 2024-02-10: qty 2

## 2024-02-10 MED ORDER — ONDANSETRON HCL 4 MG/2ML IJ SOLN
INTRAMUSCULAR | Status: DC | PRN
  Administered 2024-02-10: 4 mg via INTRAVENOUS

## 2024-02-10 MED ORDER — ROCURONIUM BROMIDE 10 MG/ML (PF) SYRINGE
PREFILLED_SYRINGE | INTRAVENOUS | Status: DC | PRN
  Administered 2024-02-10: 50 mg via INTRAVENOUS
  Administered 2024-02-10: 100 mg via INTRAVENOUS

## 2024-02-10 SURGICAL SUPPLY — 33 items
BAG COUNTER SPONGE SURGICOUNT (BAG) ×2 IMPLANT
BLADE CLIPPER SURG (BLADE) IMPLANT
CANISTER SUCTION 3000ML PPV (SUCTIONS) ×2 IMPLANT
COVER SURGICAL LIGHT HANDLE (MISCELLANEOUS) ×2 IMPLANT
DRAPE UTILITY XL STRL (DRAPES) ×2 IMPLANT
DRSG TEGADERM 4X4.75 (GAUZE/BANDAGES/DRESSINGS) ×2 IMPLANT
DRSG TELFA 3X8 NADH STRL (GAUZE/BANDAGES/DRESSINGS) ×2 IMPLANT
ELECT CAUTERY BLADE 6.4 (BLADE) ×2 IMPLANT
ELECTRODE REM PT RTRN 9FT ADLT (ELECTROSURGICAL) ×2 IMPLANT
GAUZE 4X4 16PLY ~~LOC~~+RFID DBL (SPONGE) ×2 IMPLANT
GLOVE BIO SURGEON STRL SZ 6.5 (GLOVE) ×2 IMPLANT
GLOVE BIOGEL PI IND STRL 6 (GLOVE) ×2 IMPLANT
GOWN STRL REUS W/ TWL LRG LVL3 (GOWN DISPOSABLE) ×4 IMPLANT
KIT BASIN OR (CUSTOM PROCEDURE TRAY) ×2 IMPLANT
KIT TURNOVER KIT B (KITS) ×2 IMPLANT
NS IRRIG 1000ML POUR BTL (IV SOLUTION) ×2 IMPLANT
PACK EENT II TURBAN DRAPE (CUSTOM PROCEDURE TRAY) ×2 IMPLANT
PAD ARMBOARD POSITIONER FOAM (MISCELLANEOUS) ×4 IMPLANT
PENCIL BUTTON HOLSTER BLD 10FT (ELECTRODE) ×2 IMPLANT
SPONGE INTESTINAL PEANUT (DISPOSABLE) ×2 IMPLANT
SUT SILK 2 0 SH (SUTURE) IMPLANT
SUT VIC AB 2-0 SH 27XBRD (SUTURE) IMPLANT
SUT VICRYL 2-0 SH 8X27 (SUTURE) IMPLANT
SUT VICRYL AB 3 0 TIES (SUTURE) ×2 IMPLANT
SYR 20ML LL LF (SYRINGE) ×2 IMPLANT
TOWEL GREEN STERILE (TOWEL DISPOSABLE) ×2 IMPLANT
TOWEL GREEN STERILE FF (TOWEL DISPOSABLE) ×2 IMPLANT
TRAY TRACH DILATOR BL RHINO G2 (MISCELLANEOUS) IMPLANT
TRAY W/TRACH TUBE 7.5 (MISCELLANEOUS) IMPLANT
TRAY W/TRACH TUBE 8.5 (MISCELLANEOUS) IMPLANT
TUBE CONNECTING 12X1/4 (SUCTIONS) ×2 IMPLANT
TUBE TRACH 6.0 CUFF FLEX (MISCELLANEOUS) IMPLANT
TUBE TRACH 6.0 EXL PROX CUF (TUBING) IMPLANT

## 2024-02-10 NOTE — Procedures (Signed)
 Cortrak  Person Inserting Tube:  Rayann Jolley, Kerstin Peeling, RD Tube Type:  Cortrak - 43 inches Tube Size:  10 Tube Location:  Left nare Initial Placement:  Stomach Secured by: Bridle Technique Used to Measure Tube Placement:  Marking at nare/corner of mouth Cortrak Secured At:  80 cm   Cortrak Tube Team Note:  Consult received to place a Cortrak feeding tube.   No x-ray is required. RN may begin using tube.   If the tube becomes dislodged please keep the tube and contact the Cortrak team at [email or web address] for replacement.  If after hours and replacement cannot be delayed, place a NG tube and confirm placement with an abdominal x-ray.    Frederik Jansky, RD Registered Dietitian  See Amion for more information

## 2024-02-10 NOTE — Op Note (Signed)
   Operative Note  Date: 02/10/2024  Procedure: percutaneous tracheostomy without bronchoscopic assistance  Pre-op diagnosis: prolonged mechanical ventilation Post-op diagnosis: same  Indication and clinical history: The patient  is a 63 y.o. year old male with prolonged mechanical ventilation  Surgeon: Anda Bamberg, MD  Anesthesia: General  Findings:  Specimen: none EBL: <5cc  Drains/Implants: #6 Shiley cuffed proximal XLT tracheostomy tube  Disposition: ICU  Description of procedure: The patient was positioned supine with a shoulder roll. Time-out was performed verifying correct patient, procedure, signature of informed consent, and pre-operative antibiotics as indicated. Anesthetic induction was uneventful and the patient was confirmed to be on 100% FiO2. The neck was prepped and draped in the usual sterile fashion. Palpation of neck anatomy was performed. A longitudinal incision was made in the neck and deepened down to the trachea.   The pilot balloon was deflated and the endotracheal tube slowly retracted proximally until the tip of the endotracheal tube was palpated just below the cricoid cartilage. An introducer needle was inserted between the second and third tracheal rings. A guidewire was passed through the introducer needle and the needle removed. Serial dilation was performed and a #6 proximal XLT cuffed Shiley and stylet were inserted over the guidewire and the guidewire and stylet removed. The inner cannula was inserted, the pilot balloon on the tracheostomy inflated, and the ventilator tubing disconnected from the endotracheal tube and connected to the tracheostomy. Chest rise, appropriate end-tidal CO2, and return tidal volumes were confirmed. The tracheostomy tube was sutured in four quadrants and a tracheostomy tie applied to the neck. The endotracheal tube was removed. The patient tolerated the procedure well. There were no complications. Post-procedure chest x-ray was  ordered to confirm tube position and the absence of a pneumothorax.   Anda Bamberg, MD General and Trauma Surgery [facility]

## 2024-02-10 NOTE — Transfer of Care (Signed)
 Immediate Anesthesia Transfer of Care Note  Patient: GRIFFITH DILMORE  Procedure(s) Performed: CREATION, TRACHEOSTOMY  Patient Location: SICU  Anesthesia Type:General  Level of Consciousness: sedated, unresponsive, and Patient remains intubated per anesthesia plan  Airway & Oxygen Therapy: Patient remains intubated per anesthesia plan and Patient placed on Ventilator (see vital sign flow sheet for setting)  Post-op Assessment: Report given to RN and Post -op Vital signs reviewed and stable  Post vital signs: Reviewed and stable  Last Vitals:  Vitals Value Taken Time  BP 129/72   Temp 36.7   Pulse 70   Resp 18   SpO2 92     Last Pain:  Vitals:   02/10/24 0830  TempSrc: Axillary  PainSc:          Complications: No notable events documented.

## 2024-02-10 NOTE — Progress Notes (Signed)
 Trauma/Critical Care Follow Up Note  Subjective:    Overnight Issues:   Objective:  Vital signs for last 24 hours: Temp:  [97.6 F (36.4 C)-98.8 F (37.1 C)] 98.4 F (36.9 C) (05/12 0830) Pulse Rate:  [23-76] 23 (05/12 0800) Resp:  [11-33] 23 (05/12 0800) BP: (109-143)/(64-101) 109/72 (05/12 0600) SpO2:  [97 %-100 %] 99 % (05/12 0630) FiO2 (%):  [35 %-40 %] 35 % (05/12 0800) Weight:  [95.7 kg] 95.7 kg (05/12 0500)  Hemodynamic parameters for last 24 hours:    Intake/Output from previous day: 05/11 0701 - 05/12 0700 In: 2705 [I.V.:2505.2; IV Piggyback:199.9] Out: 1050 [Urine:1050]  Intake/Output this shift: No intake/output data recorded.  Vent settings for last 24 hours: Vent Mode: PRVC FiO2 (%):  [35 %-40 %] 35 % Set Rate:  [18 bmp] 18 bmp Vt Set:  [510 mL] 510 mL PEEP:  [5 cmH20] 5 cmH20 Pressure Support:  [20 cmH20] 20 cmH20 Plateau Pressure:  [22 cmH20-28 cmH20] 28 cmH20  Physical Exam:  Gen: comfortable, no distress Neuro: sedated on exam HEENT: PERRL Neck: supple CV: RRR Pulm: unlabored breathing on mechanical ventilation-full support Abd: soft, NT  , +BM GU: urine clear and yellow, +spontaneous voids Extr: wwp, no edema  Results for orders placed or performed during the hospital encounter of 01/24/24 (from the past 24 hours)  Glucose, capillary     Status: Abnormal   Collection Time: 02/09/24  3:12 PM  Result Value Ref Range   Glucose-Capillary 133 (H) 70 - 99 mg/dL  Glucose, capillary     Status: Abnormal   Collection Time: 02/10/24 12:22 AM  Result Value Ref Range   Glucose-Capillary 138 (H) 70 - 99 mg/dL  Triglycerides     Status: None   Collection Time: 02/10/24  3:55 AM  Result Value Ref Range   Triglycerides 106 <150 mg/dL  Magnesium      Status: None   Collection Time: 02/10/24  3:55 AM  Result Value Ref Range   Magnesium  1.9 1.7 - 2.4 mg/dL  Phosphorus     Status: None   Collection Time: 02/10/24  3:55 AM  Result Value Ref Range    Phosphorus 4.5 2.5 - 4.6 mg/dL  Comprehensive metabolic panel     Status: Abnormal   Collection Time: 02/10/24  3:55 AM  Result Value Ref Range   Sodium 138 135 - 145 mmol/L   Potassium 4.0 3.5 - 5.1 mmol/L   Chloride 107 98 - 111 mmol/L   CO2 22 22 - 32 mmol/L   Glucose, Bld 138 (H) 70 - 99 mg/dL   BUN 16 8 - 23 mg/dL   Creatinine, Ser 1.19 (L) 0.61 - 1.24 mg/dL   Calcium 9.3 8.9 - 14.7 mg/dL   Total Protein 6.3 (L) 6.5 - 8.1 g/dL   Albumin  2.3 (L) 3.5 - 5.0 g/dL   AST 82 (H) 15 - 41 U/L   ALT 114 (H) 0 - 44 U/L   Alkaline Phosphatase 110 38 - 126 U/L   Total Bilirubin 0.2 0.0 - 1.2 mg/dL   GFR, Estimated >82 >95 mL/min   Anion gap 9 5 - 15  Glucose, capillary     Status: Abnormal   Collection Time: 02/10/24  8:31 AM  Result Value Ref Range   Glucose-Capillary 122 (H) 70 - 99 mg/dL    Assessment & Plan:  Present on Admission:  Fall    LOS: 17 days   Additional comments:I reviewed the patient's new clinical lab test results.  and I reviewed the patients new imaging test results.    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 On ADT w/ Zytiga/prednisone  daily at home - will resume once can take PO (cannot be given per tube) Acute urinary retention  - foley, urecholine   FEN - TNA VTE - SCD's, LMWH ID - Rocephin  for strep pneumo PNA, plan 7d Dispo - ICU, plan trach today  Critical Care Total Time: 35 minutes  Anda Bamberg, MD Trauma & General Surgery Please use [email or web address] to contact on call provider  02/10/2024  *Care during the described time interval was provided by  me. I have reviewed this patient's available data, including medical history, events of note, physical examination and test results as part of my evaluation.

## 2024-02-10 NOTE — Progress Notes (Signed)
 Nutrition Follow-up  DOCUMENTATION CODES:  Not applicable  INTERVENTION:  TPN to meet 100% of estimated nutrition needs until adequate enteral nutrition can be established  Once cortrak placed, recommend initiation of trickle tube feeding: Osmolite 1.5 at 20ml/hr   Once tolerance established, recommend increasing by 10ml q12h to goal rate of 9ml/hr ( per day) 60ml ProSource TF20 BID Provides 1960 kcal, 115g protein, per day    NUTRITION DIAGNOSIS:  Inadequate oral intake related to acute illness, altered GI function as evidenced by NPO status. - remains applicable  GOAL:  Patient will meet greater than or equal to 90% of their needs - Goal met via TPN  MONITOR:   Labs, Weight trends, Vent status  REASON FOR ASSESSMENT:   Consult, Ventilator New TPN/TNA  ASSESSMENT:   Pt admitted after a fall on stairs leading to L rib fractures 3-10, hemopneumothorax and pulmonary contusion. PMH significant for COPD, HTN, prostate cancer and ETOH use.  5/2: re-intubated d/t hypoxia and more obtunded 5/4: abdominal xray- findings suggest ileus 5/12: s/p trach placement  Patient remains intubated on ventilator support MV: 9.5 L/min Temp (24hrs), Avg:98.1 F (36.7 C), Min:97.6 F (36.4 C), Max:98.8 F (37.1 C)  Surgery placing trach today.  Pt's abdomen has remained distended and taut throughout admission however is now having multiple type 7 BM's.  OGT has been clamped since 5/9. Last abd xray was 5/4.  Reached out to Surgery regarding placement of Cortrak and trial of trickle TF. MD is amenable.   TPN infusing at goal rate--80 mL/hr (provides 105 g of protein and 1977 kcals per day)   Admit weight: 92 kg (likely stated) First measured weight (5/5): 87.6 kg Current weight: 95.7 kg + edema: mild pitting generalized per RN documentation  Drains/lines: Triple lumen PICC UOP: x24 hours  Medications: colace BID, folvite , miralax  daily  Labs:  Cr  0.39 AST 82 ALT 114  Diet Order:   Diet Order             Diet NPO time specified  Diet effective now                   EDUCATION NEEDS:   No education needs have been identified at this time  Skin:  Skin Assessment: Reviewed RN Assessment (closed neck incision (trach))  Last BM:  5/12 type 7 large  Height:   Ht Readings from Last 1 Encounters:  02/08/24 5\' 6"  (1.676 m)    Weight:   Wt Readings from Last 1 Encounters:  02/10/24 95.7 kg    Ideal Body Weight:  64.5 kg  BMI:  Body mass index is 34.05 kg/m.  Estimated Nutritional Needs:   Kcal:  1900-2100  Protein:  105-120g  Fluid:  >/=1.9L  Jason Rojas, RDN, LDN Clinical Nutrition See AMiON for contact information.

## 2024-02-10 NOTE — Progress Notes (Signed)
 PHARMACY - TOTAL PARENTERAL NUTRITION CONSULT NOTE   Indication: Prolonged ileus  Patient Measurements: Height: 5\' 6"  (167.6 cm) Weight: 95.7 kg (210 lb 15.7 oz) IBW/kg (Calculated) : 63.8 TPN AdjBW (KG): 72.6 Body mass index is 34.05 kg/m. Usual Weight: 85-92 kg   Assessment: Patient with history of heavy alcohol  use presented with fall on stairs, acute respiratory failure requiring mechanical ventilation, L rib fractures, L hemopneumothorax, L pulmonary contusion. Patient also developed Ileus, NGT to suction. Minimal nutrition since admission 4/25. Pharmacy consulted to dose TPN.   NGT clamped, RN reports abdominal distension this AM 5/12 regardless of large bowel movement earlier in the morning.   Glucose / Insulin : CBGs < 150, no history of diabetes, no insulin  needs Electrolytes: K 4.0 (s/p 40 mEq replacement yesterday), otherwise WNL  Renal: Scr < 1.0 Hepatic: TG 106 5/12 Intake / Output; MIVF: no stool charted yesterday but had large watery bowel movement this AM, UOP 0.5 mL/kg/h (total 1.05 L) Net IO Since Admission: 7,144.64 mL [02/10/24 0803]  GI Imaging:  5/4 KUB Findings suggest ileus  4/25 CT Chest/Ab/Pelv Stomach/Bowel: Unremarkable GI Surgeries / Procedures: n/a   Central access: PICC Line ordered 5/5  TPN start date: 5/5   Nutritional Goals: Goal TPN rate is 80 mL/hr (provides 105 g of protein and 1977 kcals per day)  RD Assessment: Estimated Needs Total Energy Estimated Needs: 1900-2100 Total Protein Estimated Needs: 105-120g Total Fluid Estimated Needs: >/=1.9L  Current Nutrition:  NPO, NGT LIWS >> clamped 5/8   Plan:  Continue TPN at goal rate 80 mL/hr providing 100% of goal needs Electrolytes in TPN: Na 50 mEq/L, K 40 mEq/L, Ca 16mEq/L, Mg 10 mEq/L, and Phos 5 mmol/L. Cl:Ac 1:1 Standard MVI and trace elements in TPN Monitor TPN labs on Mon/Thurs Thiamine  100 mg IV daily x7 days (5/5 - 5/11) Hold enteral multivitamin for now, resume when enteral  nutrition is resumed  F/u initiation and advancement of TF  Patience Bonito, PharmD, BCPS, BCCCP Clinical Pharmacist

## 2024-02-10 NOTE — Anesthesia Preprocedure Evaluation (Addendum)
 Anesthesia Evaluation  Patient identified by MRN, date of birth, ID band  Reviewed: Allergy & Precautions, NPO status , Patient's Chart, lab work & pertinent test resultsPreop documentation limited or incomplete due to emergent nature of procedure.  Airway Mallampati: Intubated       Dental no notable dental hx.    Pulmonary sleep apnea , COPD, Current Smoker   Pulmonary exam normal breath sounds clear to auscultation       Cardiovascular hypertension, Normal cardiovascular exam Rhythm:Regular Rate:Normal     Neuro/Psych  PSYCHIATRIC DISORDERS Anxiety Depression       GI/Hepatic   Endo/Other    Renal/GU Lab Results      Component                Value               Date                      K                        4.0                 02/10/2024                CO2                      22                  02/10/2024                BUN                      16                  02/10/2024                CREATININE               0.39 (L)            02/10/2024                GFRNONAA                 >60                 02/10/2024                CALCIUM                  9.3                 02/10/2024                PHOS                     4.5                 02/10/2024                ALBUMIN                   2.3 (L)             02/10/2024                GLUCOSE  138 (H)             02/10/2024                Musculoskeletal   Abdominal   Peds  Hematology Lab Results      Component                Value               Date                      WBC                      7.6                 02/09/2024                HGB                      8.8 (L)             02/09/2024                HCT                      28.7 (L)            02/09/2024                MCV                      86.4                02/09/2024                PLT                      243                 02/09/2024              Anesthesia Other  Findings Tuberculin  Reproductive/Obstetrics                             Anesthesia Physical Anesthesia Plan  ASA: 4 and emergent  Anesthesia Plan: General   Post-op Pain Management: Minimal or no pain anticipated   Induction: Inhalational  PONV Risk Score and Plan: Treatment may vary due to age or medical condition  Airway Management Planned: Tracheostomy  Additional Equipment:   Intra-op Plan:   Post-operative Plan: Post-operative intubation/ventilation  Informed Consent: I have reviewed the patients History and Physical, chart, labs and discussed the procedure including the risks, benefits and alternatives for the proposed anesthesia with the patient or authorized representative who has indicated his/her understanding and acceptance.     Only emergency history available and History available from chart only  Plan Discussed with: CRNA and Surgeon  Anesthesia Plan Comments:        Anesthesia Quick Evaluation

## 2024-02-10 NOTE — Anesthesia Postprocedure Evaluation (Signed)
 Anesthesia Post Note  Patient: WEIKKO LANTZY  Procedure(s) Performed: CREATION, TRACHEOSTOMY     Patient location during evaluation: SICU Anesthesia Type: General Level of consciousness: sedated Pain management: pain level controlled Vital Signs Assessment: post-procedure vital signs reviewed and stable Respiratory status: patient remains intubated per anesthesia plan Cardiovascular status: stable Postop Assessment: no apparent nausea or vomiting Anesthetic complications: no  No notable events documented.  Last Vitals:  Vitals:   02/10/24 1231 02/10/24 1245  BP: (!) 143/79 (!) 142/77  Pulse: 69 67  Resp: 18 18  Temp:  36.5 C  SpO2: 93% 94%    Last Pain:  Vitals:   02/10/24 1245  TempSrc: Axillary  PainSc:                  Rosalita Combe

## 2024-02-11 ENCOUNTER — Inpatient Hospital Stay (HOSPITAL_COMMUNITY)

## 2024-02-11 ENCOUNTER — Encounter (HOSPITAL_COMMUNITY): Payer: Self-pay | Admitting: Surgery

## 2024-02-11 LAB — POCT I-STAT 7, (LYTES, BLD GAS, ICA,H+H)
Acid-base deficit: 1 mmol/L (ref 0.0–2.0)
Bicarbonate: 23.2 mmol/L (ref 20.0–28.0)
Calcium, Ion: 1.28 mmol/L (ref 1.15–1.40)
HCT: 29 % — ABNORMAL LOW (ref 39.0–52.0)
Hemoglobin: 9.9 g/dL — ABNORMAL LOW (ref 13.0–17.0)
O2 Saturation: 88 %
Patient temperature: 98
Potassium: 4.2 mmol/L (ref 3.5–5.1)
Sodium: 137 mmol/L (ref 135–145)
TCO2: 24 mmol/L (ref 22–32)
pCO2 arterial: 37.3 mmHg (ref 32–48)
pH, Arterial: 7.4 (ref 7.35–7.45)
pO2, Arterial: 54 mmHg — ABNORMAL LOW (ref 83–108)

## 2024-02-11 LAB — GLUCOSE, CAPILLARY
Glucose-Capillary: 124 mg/dL — ABNORMAL HIGH (ref 70–99)
Glucose-Capillary: 137 mg/dL — ABNORMAL HIGH (ref 70–99)
Glucose-Capillary: 142 mg/dL — ABNORMAL HIGH (ref 70–99)
Glucose-Capillary: 148 mg/dL — ABNORMAL HIGH (ref 70–99)
Glucose-Capillary: 148 mg/dL — ABNORMAL HIGH (ref 70–99)
Glucose-Capillary: 166 mg/dL — ABNORMAL HIGH (ref 70–99)

## 2024-02-11 MED ORDER — CLONAZEPAM 1 MG PO TABS
1.0000 mg | ORAL_TABLET | Freq: Two times a day (BID) | ORAL | Status: DC
  Administered 2024-02-11 – 2024-02-13 (×6): 1 mg
  Filled 2024-02-11 (×6): qty 1

## 2024-02-11 MED ORDER — QUETIAPINE FUMARATE 100 MG PO TABS
100.0000 mg | ORAL_TABLET | Freq: Two times a day (BID) | ORAL | Status: DC
  Administered 2024-02-11 – 2024-02-14 (×7): 100 mg
  Filled 2024-02-11 (×8): qty 1

## 2024-02-11 MED ORDER — IPRATROPIUM-ALBUTEROL 0.5-2.5 (3) MG/3ML IN SOLN
3.0000 mL | RESPIRATORY_TRACT | Status: DC
  Administered 2024-02-11 – 2024-02-15 (×23): 3 mL via RESPIRATORY_TRACT
  Filled 2024-02-11 (×20): qty 3

## 2024-02-11 MED ORDER — ALBUTEROL SULFATE (2.5 MG/3ML) 0.083% IN NEBU
2.5000 mg | INHALATION_SOLUTION | RESPIRATORY_TRACT | Status: DC | PRN
  Administered 2024-02-11 – 2024-02-21 (×2): 2.5 mg via RESPIRATORY_TRACT
  Filled 2024-02-11 (×2): qty 3

## 2024-02-11 MED ORDER — TRAVASOL 10 % IV SOLN
INTRAVENOUS | Status: AC
  Filled 2024-02-11: qty 1056

## 2024-02-11 MED ORDER — ADULT MULTIVITAMIN W/MINERALS CH
1.0000 | ORAL_TABLET | Freq: Every day | ORAL | Status: DC
Start: 2024-02-11 — End: 2024-02-12
  Administered 2024-02-11: 1
  Filled 2024-02-11: qty 1

## 2024-02-11 MED ORDER — IOHEXOL 350 MG/ML SOLN
75.0000 mL | Freq: Once | INTRAVENOUS | Status: AC | PRN
  Administered 2024-02-11: 75 mL via INTRAVENOUS

## 2024-02-11 MED ORDER — NOREPINEPHRINE 4 MG/250ML-% IV SOLN
INTRAVENOUS | Status: AC
  Filled 2024-02-11: qty 250

## 2024-02-11 MED ORDER — MIDAZOLAM HCL 2 MG/2ML IJ SOLN
1.0000 mg | INTRAMUSCULAR | Status: DC | PRN
  Administered 2024-02-13 – 2024-02-21 (×17): 2 mg via INTRAVENOUS
  Filled 2024-02-11 (×21): qty 2

## 2024-02-11 MED ORDER — MORPHINE SULFATE (PF) 2 MG/ML IV SOLN
2.0000 mg | INTRAVENOUS | Status: DC | PRN
  Administered 2024-02-11 – 2024-02-14 (×14): 2 mg via INTRAVENOUS
  Administered 2024-02-15: 4 mg via INTRAVENOUS
  Administered 2024-02-15 – 2024-02-17 (×3): 2 mg via INTRAVENOUS
  Administered 2024-02-18 – 2024-02-19 (×4): 4 mg via INTRAVENOUS
  Administered 2024-02-20: 2 mg via INTRAVENOUS
  Administered 2024-02-20 – 2024-02-21 (×4): 4 mg via INTRAVENOUS
  Filled 2024-02-11 (×4): qty 2
  Filled 2024-02-11 (×6): qty 1
  Filled 2024-02-11: qty 2
  Filled 2024-02-11 (×3): qty 1
  Filled 2024-02-11: qty 2
  Filled 2024-02-11: qty 1
  Filled 2024-02-11: qty 2
  Filled 2024-02-11 (×4): qty 1
  Filled 2024-02-11 (×2): qty 2
  Filled 2024-02-11 (×2): qty 1
  Filled 2024-02-11 (×2): qty 2

## 2024-02-11 NOTE — Progress Notes (Signed)
 Trauma/Critical Care Follow Up Note  Subjective:    Overnight Issues:   Objective:  Vital signs for last 24 hours: Temp:  [97.7 F (36.5 C)-98.4 F (36.9 C)] 98 F (36.7 C) (05/13 0740) Pulse Rate:  [23-87] 82 (05/13 0732) Resp:  [14-38] 24 (05/13 0732) BP: (127-164)/(70-90) 132/75 (05/13 0700) SpO2:  [89 %-100 %] 97 % (05/13 0700) FiO2 (%):  [35 %-60 %] 40 % (05/13 0732) Weight:  [97.2 kg] 97.2 kg (05/13 0500)  Hemodynamic parameters for last 24 hours:    Intake/Output from previous day: 05/12 0701 - 05/13 0700 In: 2852.7 [I.V.:2732.7; NG/GT:80] Out: 2260 [Urine:2250; Blood:10]  Intake/Output this shift: No intake/output data recorded.  Vent settings for last 24 hours: Vent Mode: PRVC FiO2 (%):  [35 %-60 %] 40 % Set Rate:  [18 bmp] 18 bmp Vt Set:  [510 mL] 510 mL PEEP:  [5 cmH20] 5 cmH20 Plateau Pressure:  [23 cmH20-33 cmH20] 33 cmH20  Physical Exam:  Gen: comfortable, no distress Neuro: follows commands HEENT: PERRL Neck: supple CV: tachycardic Pulm: unlabored breathing on mechanical ventilation-full support Abd: soft, NT  , +BM GU: urine clear and yellow, +Foley Extr: wwp, no edema  Results for orders placed or performed during the hospital encounter of 01/24/24 (from the past 24 hours)  Glucose, capillary     Status: Abnormal   Collection Time: 02/10/24  8:31 AM  Result Value Ref Range   Glucose-Capillary 122 (H) 70 - 99 mg/dL  Glucose, capillary     Status: Abnormal   Collection Time: 02/10/24  3:23 PM  Result Value Ref Range   Glucose-Capillary 181 (H) 70 - 99 mg/dL  Hemoglobin W0J     Status: Abnormal   Collection Time: 02/10/24  6:54 PM  Result Value Ref Range   Hgb A1c MFr Bld 5.7 (H) 4.8 - 5.6 %   Mean Plasma Glucose 117 mg/dL  Glucose, capillary     Status: Abnormal   Collection Time: 02/10/24  7:53 PM  Result Value Ref Range   Glucose-Capillary 178 (H) 70 - 99 mg/dL  Glucose, capillary     Status: Abnormal   Collection Time: 02/11/24  12:38 AM  Result Value Ref Range   Glucose-Capillary 166 (H) 70 - 99 mg/dL  Glucose, capillary     Status: Abnormal   Collection Time: 02/11/24  3:16 AM  Result Value Ref Range   Glucose-Capillary 148 (H) 70 - 99 mg/dL  Glucose, capillary     Status: Abnormal   Collection Time: 02/11/24  7:37 AM  Result Value Ref Range   Glucose-Capillary 137 (H) 70 - 99 mg/dL    Assessment & Plan: The plan of care was discussed with the bedside nurse for the day, who is in agreement with this plan and no additional concerns were raised.   Present on Admission:  Fall    LOS: 18 days   Additional comments:I reviewed the patient's new clinical lab test results.   and I reviewed the patients new imaging test results.    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 On ADT  w/ Zytiga/prednisone  daily at home - will resume once can take PO (cannot be given per tube) Acute urinary retention  - foley, urecholine   FEN - TNA, restart TF at trickle rate VTE - SCD's, LMWH ID - Rocephin  for strep pneumo PNA, plan 7d Dispo - ICU, PT/OT/SLP  Critical Care Total Time: 40 minutes  Anda Bamberg, MD Trauma & General Surgery Please use [email or web address] to contact on call provider  02/11/2024  *Care during the described time interval was provided by me. I have reviewed this patient's available data, including medical history, events of note, physical examination and test results as part of my evaluation.

## 2024-02-11 NOTE — Progress Notes (Signed)
 SLP Cancellation Note  Patient Details Name: Jason Rojas MRN: [id number] DOB: 10/23/1960   Cancelled treatment:       Reason Eval/Treat Not Completed: Patient with new tracheostomy. Orders for SLP eval and treat for PMSV and swallowing received. Will follow pt closely for readiness for SLP interventions as appropriate.     Amil Kale, M.A., CCC-SLP Speech Language Pathology, Acute Rehabilitation Services  Secure Chat preferred [phone]  02/11/2024, 11:53 AM

## 2024-02-11 NOTE — Progress Notes (Signed)
 Called to bedside for concern for vomiting blood.  Upon my arrival he was having dark brown, old bloody output suctions from his mouth.  He was noted to have his trach "coughed out" a bit.  This was replaced and his peak airway pressures and oxygenation improved.  This also improved and stopped the output in his mouth.  This was suspected to be old blood from his trach that was placed yesterday as this was noted to be oozy.  He was noted to be lethargic and more lethargic than this morning.  His TFs have been held, fentanyl  gtt turned off, and precedex  turned off.  He has only had oxy 5mg  and morphine  2mg  earlier this morning.  His BP transiently dropped but improved with no intervention.  A STAT CT head/C/A/P was ordered as his abdomen was newly distended.  He had no neuro exam as he was essentially unresponsive for the majority of our interaction.  His Left pupil is 8mm and nonreactive, which is baseline per report.  His R is 4mm and reactive.    We traveled with the patient for these scans and he remained stable.  Brief review of his A/P shows gastric distention and colonic ileus.  Will DC cortrak and place NGT to LIWS.  We did also obtained an ABG which was stable.  Jason Rojas 12:33 PM 02/11/2024

## 2024-02-11 NOTE — Progress Notes (Signed)
 RT assisted with transport of this pt from 3M12 to CT and back while on full ventilatory support. Pt tolerated well with SVS and no complications.

## 2024-02-11 NOTE — Progress Notes (Signed)
 PHARMACY - TOTAL PARENTERAL NUTRITION CONSULT NOTE   Indication: Prolonged ileus  Patient Measurements: Height: 5\' 6"  (167.6 cm) Weight: 97.2 kg (214 lb 4.6 oz) IBW/kg (Calculated) : 63.8 TPN AdjBW (KG): 72.6 Body mass index is 34.59 kg/m. Usual Weight: 85-92 kg   Assessment: Patient with history of heavy alcohol  use presented with fall on stairs, acute respiratory failure requiring mechanical ventilation, L rib fractures, L hemopneumothorax, L pulmonary contusion. Patient also developed Ileus, NGT to suction. Minimal nutrition since admission 4/25. Pharmacy consulted to dose TPN.   Trickle tube feeds to start today 5/13 per MD.   Glucose / Insulin : CBGs < 150, no history of diabetes, no insulin  needs Electrolytes: K 4.0 (s/p 40 mEq replacement yesterday), otherwise WNL  Renal: Scr < 1.0 Hepatic: TG 106 5/12 Intake / Output; MIVF: no stool charted yesterday but had large watery bowel movement this AM, UOP 0.5 mL/kg/h (total 1.05 L) Net IO Since Admission: 7,850.29 mL [02/11/24 0756]  GI Imaging:  5/4 KUB Findings suggest ileus  4/25 CT Chest/Ab/Pelv Stomach/Bowel: Unremarkable GI Surgeries / Procedures: n/a   Central access: PICC Line ordered 5/5  TPN start date: 5/5   Nutritional Goals: Goal TPN rate is 80 mL/hr (provides 105 g of protein and 1977 kcals per day)  RD Assessment: Estimated Needs Total Energy Estimated Needs: 1900-2100 Total Protein Estimated Needs: 105-120g Total Fluid Estimated Needs: >/=1.9L  Current Nutrition:  Osmolite 1.5 @ 20 to start today   Plan:  Continue TPN at goal rate 80 mL/hr providing 100% of goal needs Electrolytes in TPN: Na 50 mEq/L, K 40 mEq/L, Ca 88mEq/L, Mg 10 mEq/L, and Phos 5 mmol/L. Cl:Ac 1:1 Standard MVI and trace elements in TPN Monitor TPN labs on Mon/Thurs Enteral multivitamin, will remove from TPN  F/u advancement of TF  Patience Bonito, PharmD, BCPS, BCCCP Clinical Pharmacist

## 2024-02-12 LAB — GLUCOSE, CAPILLARY
Glucose-Capillary: 111 mg/dL — ABNORMAL HIGH (ref 70–99)
Glucose-Capillary: 126 mg/dL — ABNORMAL HIGH (ref 70–99)
Glucose-Capillary: 129 mg/dL — ABNORMAL HIGH (ref 70–99)
Glucose-Capillary: 133 mg/dL — ABNORMAL HIGH (ref 70–99)
Glucose-Capillary: 133 mg/dL — ABNORMAL HIGH (ref 70–99)
Glucose-Capillary: 138 mg/dL — ABNORMAL HIGH (ref 70–99)
Glucose-Capillary: 146 mg/dL — ABNORMAL HIGH (ref 70–99)

## 2024-02-12 MED ORDER — TRAVASOL 10 % IV SOLN
INTRAVENOUS | Status: AC
  Filled 2024-02-12: qty 1056

## 2024-02-12 MED ORDER — LACTATED RINGERS IV BOLUS
1000.0000 mL | Freq: Once | INTRAVENOUS | Status: AC
  Administered 2024-02-12: 1000 mL via INTRAVENOUS

## 2024-02-12 MED ORDER — LACTATED RINGERS IV SOLN: INTRAVENOUS | Status: DC

## 2024-02-12 NOTE — Progress Notes (Signed)
 SLP Cancellation Note  Patient Details Name: [name] MRN: [id number] DOB: 1961/04/16   Cancelled treatment:       Reason Eval/Treat Not Completed: Patient not medically ready. New orders received but pt remains on vent, no ATC trials yet. SLP will continue following for readiness to participate in PMSV evaluation.     Amil Kale, M.A., CCC-SLP Speech Language Pathology, Acute Rehabilitation Services  Secure Chat preferred [phone]  02/12/2024, 12:58 PM

## 2024-02-12 NOTE — TOC Progression Note (Signed)
 Transition of Care [facility]) - Progression Note    Patient Details  Name: Jason Rojas MRN: [id number] Date of Birth: September 23, 1961  [facility]) CM/SW Contact  Maysun Meditz M, RN Phone Number: 02/12/2024, 1:49 PM  Clinical Narrative:    Patient remains sedated and on ventilator; no TC wean yet. May need to consider LTAC for prolonged vent weaning.  Will discuss with Trauma MD.    Expected Discharge Plan: Skilled Nursing Facility Barriers to Discharge: Continued Medical Work up  Expected Discharge Plan and Services       Living arrangements for the past 2 months: Single Family Home                                       Social Determinants of Health (SDOH) Interventions SDOH Screenings   Food Insecurity: No Food Insecurity (01/25/2024)  Housing: Low Risk  (01/25/2024)  Transportation Needs: No Transportation Needs (01/27/2024)  Utilities: Not At Risk (01/27/2024)  Tobacco Use: High Risk (01/24/2024)    Readmission Risk Interventions    01/28/2024   12:04 PM  Readmission Risk Prevention Plan  Transportation Screening Complete  PCP or Specialist Appt within 3-5 Days Complete  HRI or Home Care Consult Complete  Social Work Consult for Recovery Care Planning/Counseling Complete  Palliative Care Screening Not Applicable  Medication Review (RN Care Manager) Complete   Calla Catchings, RN, BSN  Trauma/Neuro ICU Case Manager [phone]

## 2024-02-12 NOTE — Evaluation (Addendum)
 Occupational Therapy Evaluation Patient Details Name: Jason Rojas MRN: [id number] DOB: August 23, 1961 Today's Date: 02/12/2024   History of Present Illness   Pt is a 63 y.o. male who presented 01/24/24 s/p x2 falls outside. ED workup significant for L Rib FX 3-10, L hemopneumothorax, and L pulmonary contusion. On CIWA protocol. Pt became hypoxic and obtunded on 5/2 and was intubated. Trach placed 3/12. PMH: COPD, HTN, PTSD, sleep apnea, EtOH abuse, and prostate CA     Clinical Impressions Pt currently needing up to max A for ADLs, tolerates bed chair position for oral suctioning task and is able to do so using RUE. Pt intermittently following commands and keeping eyes closed frequently throughout session. Pt presenting with impairments listed below, will follow acutely. Recommend OT at [facility] upon d/c.      If plan is discharge home, recommend the following:   Two people to help with walking and/or transfers;Two people to help with bathing/dressing/bathroom;Direct supervision/assist for medications management;Direct supervision/assist for financial management;Assist for transportation;Supervision due to cognitive status;Help with stairs or ramp for entrance     Functional Status Assessment   Patient has had a recent decline in their functional status and demonstrates the ability to make significant improvements in function in a reasonable and predictable amount of time.     Equipment Recommendations   Other (comment) (defer)     Recommendations for Other Services   PT consult     Precautions/Restrictions   Precautions Precautions: Fall;Other (comment) Recall of Precautions/Restrictions: Impaired Precaution/Restrictions Comments: vent, trach, NG Restrictions Weight Bearing Restrictions Per Provider Order: No     Mobility Bed Mobility Overal bed mobility: Needs Assistance             General bed mobility comments: Placed bed in to chair position. Assisted pt to  pull trunk forward from bed with +2 mod/max assist    Transfers                   General transfer comment: deferred      Balance Overall balance assessment: Needs assistance Sitting-balance support: Feet supported, Single extremity supported, No upper extremity supported Sitting balance-Leahy Scale: Poor Sitting balance - Comments: right lateral and posterior lean Postural control: Right lateral lean                                 ADL either performed or assessed with clinical judgement   ADL Overall ADL's : Needs assistance/impaired Eating/Feeding: NPO   Grooming: Oral care;Contact guard assist;Bed level Grooming Details (indicate cue type and reason): suctioning mouth Upper Body Bathing: Bed level;Moderate assistance   Lower Body Bathing: Maximal assistance;Bed level   Upper Body Dressing : Moderate assistance;Bed level   Lower Body Dressing: Maximal assistance;Bed level   Toilet Transfer: Maximal assistance;+2 for physical assistance   Toileting- Clothing Manipulation and Hygiene: Maximal assistance       Functional mobility during ADLs: Maximal assistance;+2 for physical assistance       Vision   Additional Comments: keeps eyes closed frequently during session     Perception Perception: Not tested       Praxis Praxis: Not tested       Pertinent Vitals/Pain Pain Assessment Pain Assessment: Faces Pain Score: 2  Faces Pain Scale: Hurts a little bit Pain Location: L ribs Pain Descriptors / Indicators: Discomfort, Grimacing, Guarding, Moaning Pain Intervention(s): Limited activity within patient's tolerance, Monitored during session, Repositioned  Extremity/Trunk Assessment Upper Extremity Assessment Upper Extremity Assessment: Generalized weakness (2/5 grasp, does not hold LUE up against gravity)   Lower Extremity Assessment Lower Extremity Assessment: Defer to PT evaluation RLE Deficits / Details: grossly 2+ to 3-/5 LLE  Deficits / Details: grossly 2+ to 3-/5   Cervical / Trunk Assessment Cervical / Trunk Assessment: Normal   Communication Communication Communication: Impaired Factors Affecting Communication: Trach/intubated   Cognition Arousal: Lethargic Behavior During Therapy: Flat affect Cognition: No family/caregiver present to determine baseline             OT - Cognition Comments: intermittently opening eyes, follows ~30% of commands, when asked "what is bothering him" in regard to pain, pt points at therapist                 Following commands: Impaired Following commands impaired: Follows one step commands with increased time     Cueing  General Comments   Cueing Techniques: Tactile cues;Verbal cues  VSS on 40% FiO2 vent PEEP 5   Exercises     Shoulder Instructions      Home Living Family/patient expects to be discharged to:: Private residence Living Arrangements: Parent (mother) Available Help at Discharge: Family;Available PRN/intermittently (mother unable to physically assist) Type of Home: House Home Access: Stairs to enter [employer] of Steps: 1   Home Layout: One level               Home Equipment: Cane - single point   Additional Comments: pt typically lives alone but plans to go stay with elderly mother, who also falls often, info above is for mother's house      Prior Functioning/Environment Prior Level of Function : Independent/Modified Independent             Mobility Comments: Uses SPC, reports hx of falls      OT Problem List: Decreased strength;Decreased activity tolerance;Impaired balance (sitting and/or standing);Decreased safety awareness;Decreased cognition;Pain;Decreased knowledge of use of DME or AE;Decreased coordination   OT Treatment/Interventions: Self-care/ADL training;Patient/family education;Balance training;Therapeutic activities;Cognitive remediation/compensation;DME and/or AE instruction      OT  Goals(Current goals can be found in the care plan section)   Acute Rehab OT Goals Patient Stated Goal: did not state OT Goal Formulation: With patient Time For Goal Achievement: 02/26/24 Potential to Achieve Goals: Good ADL Goals Pt Will Perform Upper Body Bathing: with mod assist;sitting Pt Will Perform Lower Body Bathing: sit to/from stand;with mod assist Pt Will Transfer to Toilet: ambulating;regular height toilet;with mod assist Pt Will Perform Toileting - Clothing Manipulation and hygiene: with mod assist;sitting/lateral leans;sit to/from stand Additional ADL Goal #1: pt will perform bed mobility min A in prep for seated ADLs   OT Frequency:  Min 2X/week    Co-evaluation PT/OT/SLP Co-Evaluation/Treatment: Yes Reason for Co-Treatment: For patient/therapist safety;Necessary to address cognition/behavior during functional activity PT goals addressed during session: Strengthening/ROM OT goals addressed during session: ADL's and self-care;Strengthening/ROM      AM-PAC OT "6 Clicks" Daily Activity     Outcome Measure Help from another person eating meals?: A Lot Help from another person taking care of personal grooming?: A Little Help from another person toileting, which includes using toliet, bedpan, or urinal?: Total Help from another person bathing (including washing, rinsing, drying)?: A Lot Help from another person to put on and taking off regular upper body clothing?: A Lot Help from another person to put on and taking off regular lower body clothing?: Total 6 Click Score: 11   End of  Session Equipment Utilized During Treatment: Gait belt;Oxygen Nurse Communication: Mobility status;Precautions  Activity Tolerance: Patient limited by lethargy Patient left: with call bell/phone within reach;in bed;with bed alarm set;with restraints reapplied (bil mitts)  OT Visit Diagnosis: Unsteadiness on feet (R26.81);Other abnormalities of gait and mobility (R26.89);Repeated falls  (R29.6);Muscle weakness (generalized) (M62.81);Other symptoms and signs involving cognitive function                Time: 8657-8469 OT Time Calculation (min): 17 min Charges:  OT General Charges $OT Visit: 1 Visit OT Evaluation $OT Eval Moderate Complexity: 1 Mod  Ahnya Akre K, OTD, OTR/L SecureChat Preferred Acute Rehab [phone]   Benedict Brain Koonce 02/12/2024, 4:17 PM

## 2024-02-12 NOTE — Progress Notes (Signed)
 Trauma/Critical Care Follow Up Note  Subjective:    Overnight Issues:   Objective:  Vital signs for last 24 hours: Temp:  [98.1 F (36.7 C)-101 F (38.3 C)] 99 F (37.2 C) (05/14 1106) Pulse Rate:  [65-121] 91 (05/14 1000) Resp:  [17-38] 17 (05/14 1000) BP: (102-176)/(59-99) 135/84 (05/14 1000) SpO2:  [92 %-100 %] 96 % (05/14 1000) FiO2 (%):  [40 %] 40 % (05/14 1114) Weight:  [97 kg] 97 kg (05/14 0500)  Hemodynamic parameters for last 24 hours:    Intake/Output from previous day: 05/13 0701 - 05/14 0700 In: 2490.8 [I.V.:2420.8; NG/GT:70] Out: 3208.3 [Urine:1108.3; Emesis/NG output:2100]  Intake/Output this shift: Total I/O In: 327.5 [I.V.:327.5] Out: 96.6 [Urine:96.6]  Vent settings for last 24 hours: Vent Mode: PRVC FiO2 (%):  [40 %] 40 % Set Rate:  [18 bmp] 18 bmp Vt Set:  [510 mL] 510 mL PEEP:  [5 cmH20] 5 cmH20 Plateau Pressure:  [27 cmH20-28 cmH20] 27 cmH20  Physical Exam:  Gen: comfortable, no distress Neuro: follows commands for nursing HEENT: PERRL Neck: supple CV: RRR Pulm: unlabored breathing on mechanical ventilation-full support Abd: soft, NT, distended , +BM GU: urine clear and yellow, +Foley Extr: wwp, no edema  Results for orders placed or performed during the hospital encounter of 01/24/24 (from the past 24 hours)  Glucose, capillary     Status: Abnormal   Collection Time: 02/11/24 12:32 PM  Result Value Ref Range   Glucose-Capillary 148 (H) 70 - 99 mg/dL  Glucose, capillary     Status: Abnormal   Collection Time: 02/11/24  3:12 PM  Result Value Ref Range   Glucose-Capillary 124 (H) 70 - 99 mg/dL  Glucose, capillary     Status: Abnormal   Collection Time: 02/11/24  8:25 PM  Result Value Ref Range   Glucose-Capillary 142 (H) 70 - 99 mg/dL  Glucose, capillary     Status: Abnormal   Collection Time: 02/11/24 11:59 PM  Result Value Ref Range   Glucose-Capillary 146 (H) 70 - 99 mg/dL  Glucose, capillary     Status: Abnormal    Collection Time: 02/12/24  3:28 AM  Result Value Ref Range   Glucose-Capillary 133 (H) 70 - 99 mg/dL  Glucose, capillary     Status: Abnormal   Collection Time: 02/12/24  7:24 AM  Result Value Ref Range   Glucose-Capillary 129 (H) 70 - 99 mg/dL  Glucose, capillary     Status: Abnormal   Collection Time: 02/12/24 11:04 AM  Result Value Ref Range   Glucose-Capillary 133 (H) 70 - 99 mg/dL    Assessment & Plan: The plan of care was discussed with the bedside nurse for the day, Glenna, who is in agreement with this plan and no additional concerns were raised.   Present on Admission:  Fall    LOS: 19 days   Additional comments:I reviewed the patient's new clinical lab test results.   and I reviewed the patients new imaging test results.    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 On ADT w/ Zytiga/prednisone  daily at home -  will resume once can take PO (cannot be given per tube) Acute urinary retention  - foley, urecholine; new oliguria, bolus LR and start MIVF   FEN - TNA, NGT to sxn due to emesis 5/13, +BM VTE - SCD's, LMWH ID - Rocephin  for strep pneumo PNA, plan 7d Dispo - ICU, PT/OT/SLP  Critical Care Total Time: 35 minutes  Anda Bamberg, MD Trauma & General Surgery Please use [email or web address] to contact on call provider  02/12/2024  *Care during the described time interval was provided by me. I have reviewed this patient's available data, including medical history, events of note, physical examination and test results as part of my evaluation.

## 2024-02-12 NOTE — Progress Notes (Signed)
 Nutrition Follow-up  DOCUMENTATION CODES:   Not applicable  INTERVENTION:  TPN to meet 100% of estimated nutrition needs until adequate enteral nutrition can be established   Once cortrak placed, recommend initiation of trickle tube feeding: Vital 1.5 at 67ml/hr    Once tolerance established, recommend increasing by 10ml q12h to goal rate of 20ml/hr ( per day) 60ml ProSource TF20 BID Provides 1960 kcal, 121g protein, per day    NUTRITION DIAGNOSIS: Inadequate oral intake related to acute illness, altered GI function as evidenced by NPO status. - remains applicable  GOAL:   Patient will meet greater than or equal to 90% of their needs - goal met via TPN  MONITOR:   Labs, Weight trends, Vent status  REASON FOR ASSESSMENT:   Consult, Ventilator New TPN/TNA  ASSESSMENT:   Pt admitted after a fall on stairs leading to L rib fractures 3-10, hemopneumothorax and pulmonary contusion. PMH significant for COPD, HTN, prostate cancer and ETOH use.  5/2: re-intubated d/t hypoxia and more obtunded 5/4: abdominal xray- findings suggest ileus 5/12: s/p trach placement; Cortrak placed 5/13: emesis; CT abdomen/pelvis- stomach distended with fluid and air; no obstruction  Patient is currently intubated on ventilator support MV: 10.7 L/min Temp (24hrs), Avg:99.1 F (37.3 C), Min:98.2 F (36.8 C), Max:101 F (38.3 C)  TPN infusing at goal rate--80 mL/hr (provides 105 g of protein and 1977 kcals per day)   Pt initiated briefly on trickle TF post Cortrak placement on 5/12 however developed emesis. CT abdomen/pelvis with scattered air and fluid along the small bowel and colon without dilatation. Surgery ordered Cortrak removal and NGT to suction.  Checked in with pt at bedside. Abdomen remains distended.  Last BM 5/13. No further emesis. Output from NGT more bilious appearing today.   Admit weight: 92 kg (likely stated) First measured weight (5/5): 87.6 kg Current  weight: 97 kg + edema   Drains/lines: Triple lumen PICC UOP: x24 hours NG (tip within stomach)- output x24 hours   Medications: colace BID, folvite , SSI 0-15 units q4h, miralax  daily, LR @ 32ml/hr   Labs reviewed, last updated 5/12  CBG's 111-146 x24 hours  Diet Order:   Diet Order             Diet NPO time specified  Diet effective now                   EDUCATION NEEDS:   No education needs have been identified at this time  Skin:  Skin Assessment: Reviewed RN Assessment (closed neck incision (trach))  Last BM:  5/13 type 7 x2, large/small  Height:   Ht Readings from Last 1 Encounters:  02/08/24 5\' 6"  (1.676 m)    Weight:   Wt Readings from Last 1 Encounters:  02/12/24 97 kg    Ideal Body Weight:  64.5 kg  BMI:  Body mass index is 34.52 kg/m.  Estimated Nutritional Needs:   Kcal:  1900-2100  Protein:  105-120g  Fluid:  >/=1.9L  Rocklin Chute, RDN, LDN Clinical Nutrition See AMiON for contact information.

## 2024-02-12 NOTE — Progress Notes (Signed)
 PHARMACY - TOTAL PARENTERAL NUTRITION CONSULT NOTE   Indication: Prolonged ileus  Patient Measurements: Height: 5\' 6"  (167.6 cm) Weight: 97 kg (213 lb 13.5 oz) IBW/kg (Calculated) : 63.8 TPN AdjBW (KG): 72.6 Body mass index is 34.52 kg/m. Usual Weight: 85-92 kg   Assessment: Patient with history of heavy alcohol  use presented with fall on stairs, acute respiratory failure requiring mechanical ventilation, L rib fractures, L hemopneumothorax, L pulmonary contusion. Patient also developed Ileus, NGT to suction. Minimal nutrition since admission 4/25. Pharmacy consulted to dose TPN.   NGT back to [facility] yesterday 5/13.  Glucose / Insulin : CBGs < 180, no history of diabetes, 12 units of insulin /24 hours  Electrolytes: WNL  Renal: Scr < 1.0 Hepatic: TG 106 5/12 Intake / Output; MIVF: NGT output 2.1 L on 5/13 LBM 5/13 UOP 0.5 mL/kg/h (total 1.08 L) Net IO Since Admission: 7,072.28 mL [02/12/24 0755]  GI Imaging:  5/13 CT c/ab/p distended stomach, small bowel, and colon filled with fluid and air  5/4 KUB Findings suggest ileus  4/25 CT Chest/Ab/Pelv Stomach/Bowel: Unremarkable GI Surgeries / Procedures: n/a   Central access: PICC Line ordered 5/5  TPN start date: 5/5   Nutritional Goals: Goal TPN rate is 80 mL/hr (provides 105 g of protein and 1977 kcals per day)  RD Assessment: Estimated Needs Total Energy Estimated Needs: 1900-2100 Total Protein Estimated Needs: 105-120g Total Fluid Estimated Needs: >/=1.9L  Current Nutrition:  NPO, NGT LIWS   Plan:  Continue TPN at goal rate 80 mL/hr providing 100% of goal needs Electrolytes in TPN: Na 50 mEq/L, K 40 mEq/L, Ca 69mEq/L, Mg 10 mEq/L, and Phos 5 mmol/L. Cl:Ac 1:1 Standard MVI and trace elements in TPN Monitor TPN labs on Mon/Thurs Add multivitamin back to TPN given NGT output  F/u resumption of TF  Patience Bonito, PharmD, BCPS, BCCCP Clinical Pharmacist

## 2024-02-12 NOTE — Evaluation (Signed)
 Physical Therapy Evaluation Patient Details Name: Jason Rojas MRN: [id number] DOB: 1961-06-14 Today's Date: 02/12/2024  History of Present Illness  Pt is a 63 y.o. male who presented 01/24/24 s/p x2 falls outside. ED workup significant for L Rib FX 3-10, L hemopneumothorax, and L pulmonary contusion. On CIWA protocol. Pt became hypoxic and obtunded on 5/2 and was intubated. Trach placed 3/12. PMH: COPD, HTN, PTSD, sleep apnea, EtOH abuse, and prostate CA  Clinical Impression  Pt admitted with above diagnosis and presents to PT with functional limitations due to deficits listed below (See PT problem list). Pt needs skilled PT to maximize independence and safety. PT resumed for patient. Vent dependent and will likely need extended rehab. Will work toward EOB and OOB to chair. Patient will benefit from continued inpatient follow up therapy, <3 hours/day.           If plan is discharge home, recommend the following: Two people to help with walking and/or transfers;Two people to help with bathing/dressing/bathroom;Assistance with cooking/housework;Assistance with feeding;Assist for transportation;Help with stairs or ramp for entrance   Can travel by private vehicle   No    Equipment Recommendations Wheelchair (measurements PT);Wheelchair cushion (measurements PT);Hospital bed;Hoyer lift  Recommendations for Other Services       Functional Status Assessment Patient has had a recent decline in their functional status and demonstrates the ability to make significant improvements in function in a reasonable and predictable amount of time.     Precautions / Restrictions Precautions Precautions: Fall;Other (comment) Recall of Precautions/Restrictions: Impaired Precaution/Restrictions Comments: vent, trach, NG Restrictions Weight Bearing Restrictions Per Provider Order: No      Mobility  Bed Mobility Overal bed mobility: Needs Assistance             General bed mobility comments:  Placed bed in to chair position. Assisted pt to pull trunk forward from bed with +2 mod/max assist    Transfers                        Ambulation/Gait                  Stairs            Wheelchair Mobility     Tilt Bed    Modified Rankin (Stroke Patients Only)       Balance                                             Pertinent Vitals/Pain      Home Living Family/patient expects to be discharged to:: Private residence Living Arrangements: Parent (mother) Available Help at Discharge: Family;Available PRN/intermittently (mother unable to physically assist) Type of Home: House Home Access: Stairs to enter   [employer] of Steps: 1   Home Layout: One level Home Equipment: Cane - single point Additional Comments: pt typically lives alone but plans to go stay with elderly mother, who also falls often, info above is for [employer] house    Prior Function Prior Level of Function : Independent/Modified Independent             Mobility Comments: Uses SPC, reports hx of falls       Extremity/Trunk Assessment   Upper Extremity Assessment Upper Extremity Assessment: Defer to OT evaluation    Lower Extremity Assessment Lower Extremity Assessment: RLE deficits/detail;LLE deficits/detail RLE Deficits /  Details: grossly 2+ to 3-/5 LLE Deficits / Details: grossly 2+ to 3-/5       Communication   Communication Communication: Impaired Factors Affecting Communication: Trach/intubated    Cognition Arousal: Lethargic Behavior During Therapy: Flat affect   PT - Cognitive impairments: Difficult to assess Difficult to assess due to: Tracheostomy                     PT - Cognition Comments: Pt lethargic on entry but did awaken with stimuli and placing the bed in chair position. Would open eyes and follow commands for short periods before returning to sleep Following commands: Impaired Following commands  impaired: Follows one step commands with increased time     Cueing Cueing Techniques: Tactile cues, Verbal cues     General Comments General comments (skin integrity, edema, etc.): VSS on vent 40% FiO2 PEEP 5    Exercises General Exercises - Lower Extremity Ankle Circles/Pumps: AROM, Both, 5 reps, Supine Short Arc Quad: AROM, Both, 5 reps, Supine   Assessment/Plan    PT Assessment Patient needs continued PT services  PT Problem List Decreased strength;Decreased activity tolerance;Decreased balance;Decreased mobility;Decreased cognition;Cardiopulmonary status limiting activity       PT Treatment Interventions DME instruction;Functional mobility training;Therapeutic activities;Therapeutic exercise;Balance training;Patient/family education    PT Goals (Current goals can be found in the Care Plan section)  Acute Rehab PT Goals Patient Stated Goal: Pt unable PT Goal Formulation: Patient unable to participate in goal setting Time For Goal Achievement: 02/26/24 Potential to Achieve Goals: Fair Additional Goals Additional Goal #1:  (not appropriate at this time)    Frequency Min 2X/week     Co-evaluation PT/OT/SLP Co-Evaluation/Treatment: Yes Reason for Co-Treatment: For patient/therapist safety;Necessary to address cognition/behavior during functional activity PT goals addressed during session: Strengthening/ROM         AM-PAC PT "6 Clicks" Mobility  Outcome Measure Help needed turning from your back to your side while in a flat bed without using bedrails?: Total Help needed moving from lying on your back to sitting on the side of a flat bed without using bedrails?: Total Help needed moving to and from a bed to a chair (including a wheelchair)?: Total Help needed standing up from a chair using your arms (e.g., wheelchair or bedside chair)?: Total Help needed to walk in hospital room?: Total Help needed climbing 3-5 steps with a railing? : Total 6 Click Score: 6    End  of Session Equipment Utilized During Treatment: Oxygen Activity Tolerance: Patient limited by lethargy Patient left: in bed;with call bell/phone within reach;with bed alarm set Nurse Communication: Mobility status;Need for lift equipment PT Visit Diagnosis: Other abnormalities of gait and mobility (R26.89);History of falling (Z91.81);Muscle weakness (generalized) (M62.81);Difficulty in walking, not elsewhere classified (R26.2)    Time: 1610-9604 PT Time Calculation (min) (ACUTE ONLY): 24 min   Charges:   PT Evaluation $PT Eval Moderate Complexity: 1 Mod   PT General Charges $$ ACUTE PT VISIT: 1 Visit         [facility] PT Acute Rehabilitation Services Office [phone]   Pura Browns [facility] 02/12/2024, 1:57 PM

## 2024-02-13 ENCOUNTER — Inpatient Hospital Stay (HOSPITAL_COMMUNITY)

## 2024-02-13 LAB — COMPREHENSIVE METABOLIC PANEL WITH GFR
ALT: 279 U/L — ABNORMAL HIGH (ref 0–44)
AST: 145 U/L — ABNORMAL HIGH (ref 15–41)
Albumin: 2.5 g/dL — ABNORMAL LOW (ref 3.5–5.0)
Alkaline Phosphatase: 156 U/L — ABNORMAL HIGH (ref 38–126)
Anion gap: 10 (ref 5–15)
BUN: 21 mg/dL (ref 8–23)
CO2: 25 mmol/L (ref 22–32)
Calcium: 8.9 mg/dL (ref 8.9–10.3)
Chloride: 106 mmol/L (ref 98–111)
Creatinine, Ser: 0.5 mg/dL — ABNORMAL LOW (ref 0.61–1.24)
GFR, Estimated: >60 mL/min (ref >60)
Glucose, Bld: 121 mg/dL — ABNORMAL HIGH (ref 70–99)
Potassium: 3.7 mmol/L (ref 3.5–5.1)
Sodium: 141 mmol/L (ref 135–145)
Total Bilirubin: 0.7 mg/dL (ref 0.0–1.2)
Total Protein: 6.1 g/dL — ABNORMAL LOW (ref 6.5–8.1)

## 2024-02-13 LAB — PHOSPHORUS: Phosphorus: 4.4 mg/dL (ref 2.5–4.6)

## 2024-02-13 LAB — GLUCOSE, CAPILLARY
Glucose-Capillary: 105 mg/dL — ABNORMAL HIGH (ref 70–99)
Glucose-Capillary: 112 mg/dL — ABNORMAL HIGH (ref 70–99)
Glucose-Capillary: 122 mg/dL — ABNORMAL HIGH (ref 70–99)
Glucose-Capillary: 129 mg/dL — ABNORMAL HIGH (ref 70–99)
Glucose-Capillary: 143 mg/dL — ABNORMAL HIGH (ref 70–99)
Glucose-Capillary: 148 mg/dL — ABNORMAL HIGH (ref 70–99)

## 2024-02-13 LAB — MAGNESIUM: Magnesium: 2.1 mg/dL (ref 1.7–2.4)

## 2024-02-13 MED ORDER — ADULT MULTIVITAMIN W/MINERALS CH
1.0000 | ORAL_TABLET | Freq: Every day | ORAL | Status: DC
  Administered 2024-02-13 – 2024-02-21 (×9): 1
  Filled 2024-02-13 (×9): qty 1

## 2024-02-13 MED ORDER — TRAVASOL 10 % IV SOLN
INTRAVENOUS | Status: AC
  Filled 2024-02-13: qty 1056

## 2024-02-13 MED ORDER — VITAL 1.5 CAL PO LIQD
1000.0000 mL | ORAL | Status: DC
  Administered 2024-02-13: 1000 mL

## 2024-02-13 MED ORDER — POTASSIUM CHLORIDE 20 MEQ PO PACK
40.0000 meq | PACK | Freq: Once | ORAL | Status: AC
  Administered 2024-02-13: 40 meq
  Filled 2024-02-13: qty 2

## 2024-02-13 MED ORDER — PIVOT 1.5 CAL PO LIQD
1000.0000 mL | ORAL | Status: DC
Start: 2024-02-13 — End: 2024-02-13
  Filled 2024-02-13: qty 1000

## 2024-02-13 NOTE — Progress Notes (Addendum)
 PHARMACY - TOTAL PARENTERAL NUTRITION CONSULT NOTE   Indication: Prolonged ileus  Patient Measurements: Height: 5\' 6"  (167.6 cm) Weight: 97 kg (213 lb 13.5 oz) IBW/kg (Calculated) : 63.8 TPN AdjBW (KG): 72.6 Body mass index is 34.52 kg/m. Usual Weight: 85-92 kg   Assessment: Patient with history of heavy alcohol  use presented with fall on stairs, acute respiratory failure requiring mechanical ventilation, L rib fractures, L hemopneumothorax, L pulmonary contusion. Patient also developed Ileus, NGT to suction. Minimal nutrition since admission 4/25. Pharmacy consulted to dose TPN.   Tube feeds to start at trickle today 5/15.   Glucose / Insulin : CBGs < 180, no history of diabetes, 10 units of insulin /24 hours  Electrolytes: K 3.7 (40 mEq Per tube KCL x1), otherwise WNL   Renal: Scr < 1.0 Hepatic: TG 106 5/12, LFTs trending up - Alk phos 156, AST 145, ALT 279, Tbili 0.7 Intake / Output; MIVF: NGT output down to 400 mL on 5/14 LBM 5/15 UOP 0.3 mL/kg/h (total 787 mL) Net IO Since Admission: 10,819.17 mL [02/13/24 0934]  GI Imaging:  5/13 CT c/ab/p distended stomach, small bowel, and colon filled with fluid and air  5/4 KUB Findings suggest ileus  4/25 CT Chest/Ab/Pelv Stomach/Bowel: Unremarkable GI Surgeries / Procedures: n/a   Central access: PICC Line ordered 5/5  TPN start date: 5/5   Nutritional Goals: Goal TPN rate is 80 mL/hr (provides 105 g of protein and 1977 kcals per day)  RD Assessment: Estimated Needs Total Energy Estimated Needs: 1900-2100 Total Protein Estimated Needs: 105-120g Total Fluid Estimated Needs: >/=1.9L  Current Nutrition:  Tube feeds @ 20 cc/hr ordered   Plan:  Given slight rise in LFTs, will begin to cycle TPN  Cyclic rate 51 mL/hr x 1 hour >> 101 mL/hr x 18 hours >> 51 mL/hr TPN provides 100% of goal needs Electrolytes in TPN: Na 50 mEq/L, K 40 mEq/L, Ca 62mEq/L, Mg 10 mEq/L, and Phos 5 mmol/L. Cl:Ac 1:1 Standard MVI and trace elements in  TPN Monitor TPN labs on Mon/Thurs Multivitamin back to per tube and remove from TPN   F/u advancement of TF 40 mEq Per Tube KCL x1   Patience Bonito, PharmD, BCPS, BCCCP Clinical Pharmacist

## 2024-02-13 NOTE — Progress Notes (Signed)
 Patient ID: SHRISH STATE, male   DOB: 07/04/1961, 63 y.o.   MRN: [id number] Follow up - Trauma Critical Care   Patient Details:    Jason Rojas is an 63 y.o. male.  Lines/tubes : PICC Triple Lumen 02/03/24 Right Brachial 41 cm 0 cm (Active)  Indication for Insertion or Continuance of Line Prolonged intravenous therapies 02/13/24 0800  Exposed Catheter (cm) 0 cm 02/03/24 1723  Site Assessment Clean, Dry, Intact 02/13/24 0800  Lumen #1 Status Infusing 02/13/24 0800  Lumen #2 Status Infusing 02/13/24 0800  Lumen #3 Status Flushed;Saline locked 02/13/24 0800  Dressing Type Transparent 02/13/24 0800  Dressing Status Clean, Dry, Intact;Antimicrobial disc/dressing in place 02/13/24 0800  Line Care Connections checked and tightened;Line pulled back 02/13/24 0800  Line Adjustment (NICU/IV Team Only) No 02/03/24 1723  Dressing Intervention Other (Comment) 02/11/24 0800  Dressing Change Due 02/17/24 02/13/24 0800     NG/OG Vented/Dual Lumen 16 Fr. Right nare Marking at nare/corner of mouth 65 cm (Active)  Tube Position (Required) Marking at nare/corner of mouth 02/13/24 0800  Measurement (cm) (Required) 70 cm 02/13/24 0800  Ongoing Placement Verification (Required) (See row information) Yes 02/13/24 0800  Site Assessment Clean, Dry, Intact 02/13/24 0800  Interventions Clamped 02/12/24 1823  Status Low intermittent suction 02/13/24 0800  Intake (mL) 100 mL 02/12/24 2200  Output (mL) 400 mL 02/12/24 1800     Urethral Catheter Jason Pugh Rn Latex 16 Fr. (Active)  Indication for Insertion or Continuance of Catheter Acute urinary retention (I&O Cath for 24 hrs prior to catheter insertion- Inpatient Only) 02/13/24 0800  Site Assessment Clean, Dry, Intact 02/13/24 0800  Catheter Maintenance Bag below level of bladder;Catheter secured;Drainage bag/tubing not touching floor;Insertion date on drainage bag;No dependent loops;Seal intact 02/13/24 0800  Collection Container Standard drainage bag 02/13/24  0800  Securement Method Adhesive securement device 02/13/24 0800  Urinary Catheter Interventions (if applicable) Unclamped 02/13/24 0800  Output (mL) 140 mL 02/13/24 0800    Microbiology/Sepsis markers: Results for orders placed or performed during the hospital encounter of 01/24/24  MRSA Next Gen by PCR, Nasal     Status: None   Collection Time: 01/24/24  6:25 PM   Specimen: Nasal Mucosa; Nasal Swab  Result Value Ref Range Status   MRSA by PCR Next Gen NOT DETECTED NOT DETECTED Final    Comment: (NOTE) The GeneXpert MRSA Assay (FDA approved for NASAL specimens only), is one component of a comprehensive MRSA colonization surveillance program. It is not intended to diagnose MRSA infection nor to guide or monitor treatment for MRSA infections. Test performance is not FDA approved in patients less than 38 years old. Performed at [facility], [address], [place], [place] [address]   Culture, Respiratory w Gram Stain     Status: None   Collection Time: 02/01/24  6:32 AM   Specimen: Tracheal Aspirate; Respiratory  Result Value Ref Range Status   Specimen Description TRACHEAL ASPIRATE  Final   Special Requests NONE  Final   Gram Stain   Final    RARE SQUAMOUS EPITHELIAL CELLS PRESENT WBC PRESENT, PREDOMINANTLY PMN ABUNDANT GRAM POSITIVE COCCI IN PAIRS IN CHAINS IN CLUSTERS Performed at [facility], [address] [place], [place] [address]    Culture MODERATE STREPTOCOCCUS PNEUMONIAE  Final   Report Status 02/03/2024 FINAL  Final   Organism ID, Bacteria STREPTOCOCCUS PNEUMONIAE  Final      Susceptibility   Streptococcus pneumoniae - MIC*    ERYTHROMYCIN >=8  RESISTANT Resistant     LEVOFLOXACIN 0.5 SENSITIVE Sensitive     VANCOMYCIN 0.5 SENSITIVE Sensitive     PENICILLIN (meningitis) 0.25 RESISTANT Resistant     PENO - penicillin 0.25      PENICILLIN (non-meningitis) 0.25 SENSITIVE Sensitive     PENICILLIN (oral) 0.25 INTERMEDIATE Intermediate     CEFTRIAXONE   (non-meningitis) 0.25 SENSITIVE Sensitive     CEFTRIAXONE  (meningitis) 0.25 SENSITIVE Sensitive     * MODERATE STREPTOCOCCUS PNEUMONIAE    Anti-infectives:  Anti-infectives (From admission, onward)    Start     Dose/Rate Route Frequency Ordered Stop   02/03/24 1000  cefTRIAXone  (ROCEPHIN ) 2 g in sodium chloride  0.9 % 100 mL IVPB        2 g 200 mL/hr over 30 Minutes Intravenous Every 24 hours 02/03/24 0818 02/08/24 1059   02/02/24 0930  ceFEPIme  (MAXIPIME ) 2 g in sodium chloride  0.9 % 100 mL IVPB  Status:  Discontinued        2 g 200 mL/hr over 30 Minutes Intravenous Every 8 hours 02/02/24 0835 02/03/24 0818   01/24/24 0900  ceFAZolin  (ANCEF ) IVPB 2g/100 mL premix        2 g 200 mL/hr over 30 Minutes Intravenous STAT 01/24/24 0854 01/24/24 0941       Consults: Treatment Team:  Md, Trauma, MD    Studies:    Events:  Subjective:    Overnight Issues:   Objective:  Vital signs for last 24 hours: Temp:  [98 F (36.7 C)-99 F (37.2 C)] 98 F (36.7 C) (05/15 0719) Pulse Rate:  [68-110] 80 (05/15 0800) Resp:  [17-32] 22 (05/15 0800) BP: (101-151)/(65-107) 151/84 (05/15 0800) SpO2:  [94 %-100 %] 99 % (05/15 0800) FiO2 (%):  [40 %] 40 % (05/15 0758)  Hemodynamic parameters for last 24 hours:    Intake/Output from previous day: 05/14 0701 - 05/15 0700 In: 4847 [I.V.:3747; NG/GT:100; IV Piggyback:1000] Out: 1186.6 [Urine:786.6; Emesis/NG output:400]  Intake/Output this shift: Total I/O In: 166 [I.V.:166] Out: 140 [Urine:140]  Vent settings for last 24 hours: Vent Mode: PRVC FiO2 (%):  [40 %] 40 % Set Rate:  [18 bmp] 18 bmp Vt Set:  [510 mL] 510 mL PEEP:  [5 cmH20] 5 cmH20 Plateau Pressure:  [25 cmH20-30 cmH20] 25 cmH20  Physical Exam:  General: alert and on vent Neuro: F/C well HEENT/Neck: trach-clean, intact Resp: clear to auscultation bilaterally CVS: RRR  Results for orders placed or performed during the hospital encounter of 01/24/24 (from the past  24 hours)  Glucose, capillary     Status: Abnormal   Collection Time: 02/12/24 11:04 AM  Result Value Ref Range   Glucose-Capillary 133 (H) 70 - 99 mg/dL  Glucose, capillary     Status: Abnormal   Collection Time: 02/12/24  3:23 PM  Result Value Ref Range   Glucose-Capillary 111 (H) 70 - 99 mg/dL  Glucose, capillary     Status: Abnormal   Collection Time: 02/12/24  8:05 PM  Result Value Ref Range   Glucose-Capillary 138 (H) 70 - 99 mg/dL  Glucose, capillary     Status: Abnormal   Collection Time: 02/12/24 11:05 PM  Result Value Ref Range   Glucose-Capillary 126 (H) 70 - 99 mg/dL  Glucose, capillary     Status: Abnormal   Collection Time: 02/13/24  3:22 AM  Result Value Ref Range   Glucose-Capillary 122 (H) 70 - 99 mg/dL  Magnesium      Status: None   Collection Time: 02/13/24  4:30 AM  Result Value Ref Range   Magnesium  2.1 1.7 - 2.4 mg/dL  Phosphorus     Status: None   Collection Time: 02/13/24  4:30 AM  Result Value Ref Range   Phosphorus 4.4 2.5 - 4.6 mg/dL  Comprehensive metabolic panel     Status: Abnormal   Collection Time: 02/13/24  4:30 AM  Result Value Ref Range   Sodium 141 135 - 145 mmol/L   Potassium 3.7 3.5 - 5.1 mmol/L   Chloride 106 98 - 111 mmol/L   CO2 25 22 - 32 mmol/L   Glucose, Bld 121 (H) 70 - 99 mg/dL   BUN 21 8 - 23 mg/dL   Creatinine, Ser 1.61 (L) 0.61 - 1.24 mg/dL   Calcium 8.9 8.9 - 09.6 mg/dL   Total Protein 6.1 (L) 6.5 - 8.1 g/dL   Albumin  2.5 (L) 3.5 - 5.0 g/dL   AST 045 (H) 15 - 41 U/L   ALT 279 (H) 0 - 44 U/L   Alkaline Phosphatase 156 (H) 38 - 126 U/L   Total Bilirubin 0.7 0.0 - 1.2 mg/dL   GFR, Estimated >40 >98 mL/min   Anion gap 10 5 - 15  Glucose, capillary     Status: Abnormal   Collection Time: 02/13/24  7:16 AM  Result Value Ref Range   Glucose-Capillary 112 (H) 70 - 99 mg/dL    Assessment & Plan: Present on Admission:  Fall    LOS: 20 days   Additional comments:I reviewed the patient's new clinical lab test results.  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 On ADT w/ Zytiga/prednisone  daily at home - will resume once can take PO (cannot be given per tube) Acute urinary retention  - foley, U/O improved some, CRT good   FEN - TNA, ileus improving some, try TF at 20/h VTE - SCD's, LMWH ID - Rocephin  for strep pneumo PNA, plan 7d Dispo - ICU, vent wean, PT/OT/SLP as able  Critical Care Total Time*: 35 Minutes  Dorena Gander, MD, MPH, FACS Trauma & General Surgery Use [email or web address] to contact on call provider  02/13/2024  *Care during the described time interval was provided by me. I have reviewed this patient's available data, including medical history, events of note, physical examination and test results as part of my evaluation.

## 2024-02-13 NOTE — Plan of Care (Signed)
  Problem: Clinical Measurements: Goal: Ability to maintain clinical measurements within normal limits will improve Outcome: Progressing Goal: Will remain free from infection Outcome: Progressing Goal: Diagnostic test results will improve Outcome: Progressing Goal: Respiratory complications will improve Outcome: Progressing Goal: Cardiovascular complication will be avoided Outcome: Progressing   Problem: Nutrition: Goal: Adequate nutrition will be maintained Outcome: Progressing   Problem: Elimination: Goal: Will not experience complications related to bowel motility Outcome: Progressing   Problem: Pain Managment: Goal: General experience of comfort will improve and/or be controlled Outcome: Progressing   Problem: Safety: Goal: Ability to remain free from injury will improve Outcome: Progressing

## 2024-02-13 NOTE — Progress Notes (Signed)
 Brief Nutrition Support Note  Pt okayed per surgery for trickle feeds, will initiate Vital 1.5 @ 20mL/h per recommendations in RD note 5/14.  Discussed TPN with pharmacy team. Pt with consistent rise in AST and ALT over the last week. Also noted rise in bilirubin although still resulting WNL at this time. Estimated nutrition needs remain appropriate based on current clinical picture. Pharmacy to attempt to start to cycle feeds to see if giving hepatic rest will improve lab values. Plans to decrease infusion time to 20 hours with tonight's bag and continue to decrease as needed. CBGs have been well controlled with 24 hours infusion and pt does not have a history of DM. Will follow-up as planned.  Edwena Graham, RD, LDN Registered Dietitian II Please reach out via secure chat

## 2024-02-13 NOTE — Progress Notes (Signed)
 This RN noticed NG tube @ 20cm and assessed and confirmed that NG tube was out of place and followed procotol. Advanced tube and confirmed placement w xray

## 2024-02-13 NOTE — Progress Notes (Signed)
 Patient combative when trying to give IV medication and when trying to suction trach - ending in patient popping off vent. Wrist restraints applied. Amion provider notified and restraint order received.

## 2024-02-14 ENCOUNTER — Inpatient Hospital Stay (HOSPITAL_COMMUNITY)

## 2024-02-14 LAB — BASIC METABOLIC PANEL WITH GFR
Anion gap: 6 (ref 5–15)
BUN: 17 mg/dL (ref 8–23)
CO2: 25 mmol/L (ref 22–32)
Calcium: 8.5 mg/dL — ABNORMAL LOW (ref 8.9–10.3)
Chloride: 108 mmol/L (ref 98–111)
Creatinine, Ser: 0.46 mg/dL — ABNORMAL LOW (ref 0.61–1.24)
GFR, Estimated: >60 mL/min (ref >60)
Glucose, Bld: 128 mg/dL — ABNORMAL HIGH (ref 70–99)
Potassium: 4 mmol/L (ref 3.5–5.1)
Sodium: 139 mmol/L (ref 135–145)

## 2024-02-14 LAB — GLUCOSE, CAPILLARY
Glucose-Capillary: 144 mg/dL — ABNORMAL HIGH (ref 70–99)
Glucose-Capillary: 157 mg/dL — ABNORMAL HIGH (ref 70–99)
Glucose-Capillary: 152 mg/dL — ABNORMAL HIGH (ref 70–99)
Glucose-Capillary: 110 mg/dL — ABNORMAL HIGH (ref 70–99)
Glucose-Capillary: 137 mg/dL — ABNORMAL HIGH (ref 70–99)

## 2024-02-14 LAB — CBC
HCT: 25.9 % — ABNORMAL LOW (ref 39.0–52.0)
Hemoglobin: 8.1 g/dL — ABNORMAL LOW (ref 13.0–17.0)
MCH: 26.8 pg (ref 26.0–34.0)
MCHC: 31.3 g/dL (ref 30.0–36.0)
MCV: 85.8 fL (ref 80.0–100.0)
Platelets: 310 10*3/uL (ref 150–400)
RBC: 3.02 MIL/uL — ABNORMAL LOW (ref 4.22–5.81)
RDW: 14.5 % (ref 11.5–15.5)
WBC: 10.2 10*3/uL (ref 4.0–10.5)
nRBC: 0 % (ref 0.0–0.2)

## 2024-02-14 MED ORDER — ROCURONIUM BROMIDE 10 MG/ML (PF) SYRINGE: 100.0000 mg | PREFILLED_SYRINGE | Freq: Once | INTRAVENOUS | Status: AC

## 2024-02-14 MED ORDER — ROCURONIUM BROMIDE 10 MG/ML (PF) SYRINGE
PREFILLED_SYRINGE | INTRAVENOUS | Status: AC
  Administered 2024-02-14: 100 mg via INTRAVENOUS
  Filled 2024-02-14: qty 10

## 2024-02-14 MED ORDER — INSULIN ASPART 100 UNIT/ML IJ SOLN
0.0000 [IU] | INTRAMUSCULAR | Status: DC
  Administered 2024-02-14: 2 [IU] via SUBCUTANEOUS
  Administered 2024-02-14: 3 [IU] via SUBCUTANEOUS
  Administered 2024-02-15: 2 [IU] via SUBCUTANEOUS
  Administered 2024-02-15: 3 [IU] via SUBCUTANEOUS
  Administered 2024-02-15 – 2024-02-20 (×10): 2 [IU] via SUBCUTANEOUS

## 2024-02-14 MED ORDER — GUAIFENESIN 100 MG/5ML PO LIQD
15.0000 mL | Freq: Four times a day (QID) | ORAL | Status: DC
  Administered 2024-02-14 – 2024-02-21 (×29): 15 mL
  Filled 2024-02-14 (×29): qty 15

## 2024-02-14 MED ORDER — TRAVASOL 10 % IV SOLN
INTRAVENOUS | Status: AC
  Filled 2024-02-14: qty 1056

## 2024-02-14 MED ORDER — METOCLOPRAMIDE HCL 5 MG/ML IJ SOLN
10.0000 mg | Freq: Three times a day (TID) | INTRAMUSCULAR | Status: DC
  Administered 2024-02-14 – 2024-02-21 (×22): 10 mg via INTRAVENOUS
  Filled 2024-02-14 (×21): qty 2

## 2024-02-14 MED ORDER — PROPOFOL 1000 MG/100ML IV EMUL
5.0000 ug/kg/min | INTRAVENOUS | Status: DC
  Administered 2024-02-14: 5 ug/kg/min via INTRAVENOUS
  Administered 2024-02-15 – 2024-02-17 (×12): 30 ug/kg/min via INTRAVENOUS
  Administered 2024-02-17: 5 ug/kg/min via INTRAVENOUS
  Administered 2024-02-18 (×2): 35 ug/kg/min via INTRAVENOUS
  Administered 2024-02-18: 45 ug/kg/min via INTRAVENOUS
  Administered 2024-02-18 (×2): 15 ug/kg/min via INTRAVENOUS
  Administered 2024-02-19 (×2): 35 ug/kg/min via INTRAVENOUS
  Administered 2024-02-19: 25 ug/kg/min via INTRAVENOUS
  Filled 2024-02-14 (×15): qty 100
  Filled 2024-02-14: qty 200
  Filled 2024-02-14 (×3): qty 100
  Filled 2024-02-14: qty 200

## 2024-02-14 MED ORDER — QUETIAPINE FUMARATE 25 MG PO TABS
25.0000 mg | ORAL_TABLET | Freq: Two times a day (BID) | ORAL | Status: DC
  Administered 2024-02-14 – 2024-02-20 (×13): 25 mg
  Filled 2024-02-14 (×13): qty 1

## 2024-02-14 MED ORDER — CLONAZEPAM 1 MG PO TABS
2.0000 mg | ORAL_TABLET | Freq: Two times a day (BID) | ORAL | Status: DC
  Administered 2024-02-14 – 2024-02-20 (×14): 2 mg
  Filled 2024-02-14 (×14): qty 2

## 2024-02-14 MED ORDER — MAGNESIUM CITRATE PO SOLN
1.0000 | Freq: Once | ORAL | Status: AC
  Administered 2024-02-14: 1
  Filled 2024-02-14: qty 296

## 2024-02-14 MED ORDER — DEXMEDETOMIDINE HCL IN NACL 400 MCG/100ML IV SOLN
0.0000 ug/kg/h | INTRAVENOUS | Status: DC
  Administered 2024-02-14 – 2024-02-16 (×29): 2 ug/kg/h via INTRAVENOUS
  Administered 2024-02-17: 1.8 ug/kg/h via INTRAVENOUS
  Administered 2024-02-17: 2 ug/kg/h via INTRAVENOUS
  Administered 2024-02-17: 1.7 ug/kg/h via INTRAVENOUS
  Administered 2024-02-17: 1.8 ug/kg/h via INTRAVENOUS
  Administered 2024-02-17: 2 ug/kg/h via INTRAVENOUS
  Administered 2024-02-17: 1.6 ug/kg/h via INTRAVENOUS
  Administered 2024-02-17: 1.7 ug/kg/h via INTRAVENOUS
  Administered 2024-02-17 (×2): 2 ug/kg/h via INTRAVENOUS
  Administered 2024-02-18: 1.7 ug/kg/h via INTRAVENOUS
  Administered 2024-02-18 (×5): 1.4 ug/kg/h via INTRAVENOUS
  Administered 2024-02-18: 1.7 ug/kg/h via INTRAVENOUS
  Administered 2024-02-18 – 2024-02-19 (×3): 1.4 ug/kg/h via INTRAVENOUS
  Administered 2024-02-19: 1.5 ug/kg/h via INTRAVENOUS
  Administered 2024-02-19: 1 ug/kg/h via INTRAVENOUS
  Administered 2024-02-19 (×2): 1.4 ug/kg/h via INTRAVENOUS
  Administered 2024-02-19: 1.5 ug/kg/h via INTRAVENOUS
  Administered 2024-02-19: 1.4 ug/kg/h via INTRAVENOUS
  Administered 2024-02-20 (×3): 1.3 ug/kg/h via INTRAVENOUS
  Administered 2024-02-20: 1.5 ug/kg/h via INTRAVENOUS
  Administered 2024-02-20: 1.3 ug/kg/h via INTRAVENOUS
  Administered 2024-02-20: 1.4 ug/kg/h via INTRAVENOUS
  Administered 2024-02-20: 1 ug/kg/h via INTRAVENOUS
  Administered 2024-02-20: 1.5 ug/kg/h via INTRAVENOUS
  Administered 2024-02-21 (×2): 1 ug/kg/h via INTRAVENOUS
  Administered 2024-02-21 (×2): 1.3 ug/kg/h via INTRAVENOUS
  Filled 2024-02-14: qty 100
  Filled 2024-02-14: qty 200
  Filled 2024-02-14 (×4): qty 100
  Filled 2024-02-14: qty 200
  Filled 2024-02-14 (×3): qty 100
  Filled 2024-02-14: qty 200
  Filled 2024-02-14 (×5): qty 100
  Filled 2024-02-14: qty 200
  Filled 2024-02-14 (×14): qty 100
  Filled 2024-02-14: qty 200
  Filled 2024-02-14 (×2): qty 100
  Filled 2024-02-14: qty 200
  Filled 2024-02-14 (×6): qty 100
  Filled 2024-02-14: qty 200
  Filled 2024-02-14: qty 100
  Filled 2024-02-14: qty 200
  Filled 2024-02-14 (×12): qty 100
  Filled 2024-02-14: qty 200
  Filled 2024-02-14: qty 100

## 2024-02-14 MED ORDER — MIDAZOLAM HCL 2 MG/2ML IJ SOLN
2.0000 mg | Freq: Once | INTRAMUSCULAR | Status: AC
  Administered 2024-02-14: 2 mg via INTRAVENOUS

## 2024-02-14 NOTE — Progress Notes (Signed)
 Patient ID: Jason Rojas, male   DOB: 09-25-1961, 63 y.o.   MRN: [id number] Follow up - Trauma Critical Care   Patient Details:    Jason Rojas is an 63 y.o. male.  Lines/tubes : PICC Triple Lumen 02/03/24 Right Brachial 41 cm 0 cm (Active)  Indication for Insertion or Continuance of Line Prolonged intravenous therapies 02/13/24 2000  Exposed Catheter (cm) 0 cm 02/03/24 1723  Site Assessment Clean, Dry, Intact 02/13/24 2000  Lumen #1 Status Infusing 02/13/24 2000  Lumen #2 Status Infusing 02/13/24 2000  Lumen #3 Status Infusing 02/13/24 2000  Dressing Type Transparent 02/13/24 2000  Dressing Status Antimicrobial disc/dressing in place 02/13/24 2000  Line Care Connections checked and tightened 02/13/24 2000  Line Adjustment (NICU/IV Team Only) No 02/03/24 1723  Dressing Intervention New dressing 02/13/24 1200  Dressing Change Due 02/20/24 02/13/24 2000     NG/OG Vented/Dual Lumen 16 Fr. Right nare Marking at nare/corner of mouth 65 cm (Active)  Tube Position (Required) Marking at nare/corner of mouth 02/14/24 0000  Measurement (cm) (Required) 68 cm 02/14/24 0000  Ongoing Placement Verification (Required) (See row information) Yes 02/14/24 0000  Site Assessment Clean, Dry, Intact 02/14/24 0000  Interventions Clamped 02/12/24 1823  Status Feeding 02/14/24 0000  Intake (mL) 100 mL 02/12/24 2200  Output (mL) 400 mL 02/12/24 1800     Urethral Catheter [name] Latex 16 Fr. (Active)  Indication for Insertion or Continuance of Catheter Acute urinary retention (I&O Cath for 24 hrs prior to catheter insertion- Inpatient Only) 02/13/24 1939  Site Assessment Clean, Dry, Intact 02/13/24 1939  Catheter Maintenance Bag below level of bladder;Catheter secured;Drainage bag/tubing not touching floor;Insertion date on drainage bag;No dependent loops;Seal intact 02/13/24 1939  Collection Container Standard drainage bag 02/13/24 1939  Securement Method Adhesive securement device 02/13/24 1939   Urinary Catheter Interventions (if applicable) Unclamped 02/13/24 0800  Output (mL) 17 mL 02/14/24 0500    Microbiology/Sepsis markers: Results for orders placed or performed during the hospital encounter of 01/24/24  MRSA Next Gen by PCR, Nasal     Status: None   Collection Time: 01/24/24  6:25 PM   Specimen: Nasal Mucosa; Nasal Swab  Result Value Ref Range Status   MRSA by PCR Next Gen NOT DETECTED NOT DETECTED Final    Comment: (NOTE) The GeneXpert MRSA Assay (FDA approved for NASAL specimens only), is one component of a comprehensive MRSA colonization surveillance program. It is not intended to diagnose MRSA infection nor to guide or monitor treatment for MRSA infections. Test performance is not FDA approved in patients less than 76 years old. Performed at [facility], [address] [place], [place] [address]   Culture, Respiratory w Gram Stain     Status: None   Collection Time: 02/01/24  6:32 AM   Specimen: Tracheal Aspirate; Respiratory  Result Value Ref Range Status   Specimen Description TRACHEAL ASPIRATE  Final   Special Requests NONE  Final   Gram Stain   Final    RARE SQUAMOUS EPITHELIAL CELLS PRESENT WBC PRESENT, PREDOMINANTLY PMN ABUNDANT GRAM POSITIVE COCCI IN PAIRS IN CHAINS IN CLUSTERS Performed at [facility], [address], [place], [place] [address]    Culture MODERATE STREPTOCOCCUS PNEUMONIAE  Final   Report Status 02/03/2024 FINAL  Final   Organism ID, Bacteria STREPTOCOCCUS PNEUMONIAE  Final      Susceptibility   Streptococcus pneumoniae - MIC*    ERYTHROMYCIN >=8 RESISTANT Resistant     LEVOFLOXACIN  0.5 SENSITIVE Sensitive     VANCOMYCIN 0.5 SENSITIVE Sensitive     PENICILLIN (meningitis) 0.25 RESISTANT Resistant     PENO - penicillin 0.25      PENICILLIN (non-meningitis) 0.25 SENSITIVE Sensitive     PENICILLIN (oral) 0.25 INTERMEDIATE Intermediate     CEFTRIAXONE  (non-meningitis) 0.25 SENSITIVE Sensitive     CEFTRIAXONE   (meningitis) 0.25 SENSITIVE Sensitive     * MODERATE STREPTOCOCCUS PNEUMONIAE    Anti-infectives:  Anti-infectives (From admission, onward)    Start     Dose/Rate Route Frequency Ordered Stop   02/03/24 1000  cefTRIAXone  (ROCEPHIN ) 2 g in sodium chloride  0.9 % 100 mL IVPB        2 g 200 mL/hr over 30 Minutes Intravenous Every 24 hours 02/03/24 0818 02/08/24 1059   02/02/24 0930  ceFEPIme  (MAXIPIME ) 2 g in sodium chloride  0.9 % 100 mL IVPB  Status:  Discontinued        2 g 200 mL/hr over 30 Minutes Intravenous Every 8 hours 02/02/24 0835 02/03/24 0818   01/24/24 0900  ceFAZolin  (ANCEF ) IVPB 2g/100 mL premix        2 g 200 mL/hr over 30 Minutes Intravenous STAT 01/24/24 0854 01/24/24 0941     Consults: Treatment Team:  Md, Trauma, MD    Studies:    Events:  Subjective:    Overnight Issues:  More secretions, increased RR Objective:  Vital signs for last 24 hours: Temp:  [97.6 F (36.4 C)-99.1 F (37.3 C)] 98.3 F (36.8 C) (05/16 0732) Pulse Rate:  [70-89] 74 (05/16 0530) Resp:  [0-28] 19 (05/16 0530) BP: (120-165)/(65-82) 130/69 (05/16 0530) SpO2:  [93 %-100 %] 98 % (05/16 0530) FiO2 (%):  [40 %] 40 % (05/16 0743) Weight:  [101.3 kg] 101.3 kg (05/16 0440)  Hemodynamic parameters for last 24 hours:    Intake/Output from previous day: 05/15 0701 - 05/16 0700 In: 3172.4 [I.V.:2898; NG/GT:274.3] Out: 1676 [Urine:1676]  Intake/Output this shift: No intake/output data recorded.  Vent settings for last 24 hours: Vent Mode: PRVC FiO2 (%):  [40 %] 40 % Set Rate:  [18 bmp] 18 bmp Vt Set:  [510 mL] 510 mL PEEP:  [5 cmH20] 5 cmH20 Plateau Pressure:  [22 cmH20-37 cmH20] 27 cmH20  Physical Exam:  General: on vent Neuro: a bit agitated HEENT/Neck: trach-clean, intact Resp: some rhonchi B CVS: RRR GI: quite distended, NT Extremities: no sig edema  Results for orders placed or performed during the hospital encounter of 01/24/24 (from the past 24 hours)  Glucose,  capillary     Status: Abnormal   Collection Time: 02/13/24 11:17 AM  Result Value Ref Range   Glucose-Capillary 143 (H) 70 - 99 mg/dL  Glucose, capillary     Status: Abnormal   Collection Time: 02/13/24  3:41 PM  Result Value Ref Range   Glucose-Capillary 105 (H) 70 - 99 mg/dL  Glucose, capillary     Status: Abnormal   Collection Time: 02/13/24  8:01 PM  Result Value Ref Range   Glucose-Capillary 129 (H) 70 - 99 mg/dL  Glucose, capillary     Status: Abnormal   Collection Time: 02/13/24 11:40 PM  Result Value Ref Range   Glucose-Capillary 148 (H) 70 - 99 mg/dL  Glucose, capillary     Status: Abnormal   Collection Time: 02/14/24  4:04 AM  Result Value Ref Range   Glucose-Capillary 144 (H) 70 - 99 mg/dL  CBC     Status: Abnormal   Collection Time: 02/14/24  5:21 AM  Result Value Ref Range   WBC 10.2 4.0 - 10.5 K/uL   RBC 3.02 (L) 4.22 - 5.81 MIL/uL   Hemoglobin 8.1 (L) 13.0 - 17.0 g/dL   HCT 21.3 (L) 08.6 - 57.8 %   MCV 85.8 80.0 - 100.0 fL   MCH 26.8 26.0 - 34.0 pg   MCHC 31.3 30.0 - 36.0 g/dL   RDW 46.9 62.9 - 52.8 %   Platelets 310 150 - 400 K/uL   nRBC 0.0 0.0 - 0.2 %  Basic metabolic panel with GFR     Status: Abnormal   Collection Time: 02/14/24  5:21 AM  Result Value Ref Range   Sodium 139 135 - 145 mmol/L   Potassium 4.0 3.5 - 5.1 mmol/L   Chloride 108 98 - 111 mmol/L   CO2 25 22 - 32 mmol/L   Glucose, Bld 128 (H) 70 - 99 mg/dL   BUN 17 8 - 23 mg/dL   Creatinine, Ser 4.13 (L) 0.61 - 1.24 mg/dL   Calcium 8.5 (L) 8.9 - 10.3 mg/dL   GFR, Estimated >24 >40 mL/min   Anion gap 6 5 - 15  Glucose, capillary     Status: Abnormal   Collection Time: 02/14/24  7:25 AM  Result Value Ref Range   Glucose-Capillary 157 (H) 70 - 99 mg/dL    Assessment & Plan: Present on Admission:  Fall    LOS: 21 days   Additional comments:I reviewed the patient's new clinical lab test results. CXR is pending 63 y/o M hx of fall on stairs   Acute hypoxic ventilator dependent  respiratory failure  - continue weaning as able, trach 5/12-proximal XLT. More secretions and fighting vent today. 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 On ADT w/ Zytiga/prednisone  daily at home - will resume once can take PO (cannot be given per tube) Acute urinary retention  - foley, U/O improved some, CRT good   FEN - TNA, ileus persists though having some BMs, continue TF at 20/h, Mg citrate today VTE - SCD's, LMWH ID - completed Rocephin  for strep pneumo PNA, afeb and WBC WNL Dispo - ICU, vent wean, PT/OT/SLP as able Critical Care Total Time*: 36 Minutes  Dorena Gander, MD, MPH, FACS Trauma & General Surgery Use [email or web address] to contact on call provider  02/14/2024  *Care during the described time interval was provided by me. I have reviewed this patient's available data, including medical history, events of note, physical examination and test results as part of my evaluation.

## 2024-02-14 NOTE — Progress Notes (Signed)
 SLP Cancellation Note  Patient Details Name: Jason Rojas MRN: [id number] DOB: November 20, 1960   Cancelled eval: Not yet ready for PMV eval. Will follow.  Cornell Gaber L. Beatris Lincoln, MA CCC/SLP Clinical Specialist - Acute Care SLP Acute Rehabilitation Services Office number [phone]           Myna Asal Laurice 02/14/2024, 8:44 AM

## 2024-02-14 NOTE — Progress Notes (Addendum)
 PHARMACY - TOTAL PARENTERAL NUTRITION CONSULT NOTE   Indication: Prolonged ileus  Patient Measurements: Height: 5\' 6"  (167.6 cm) Weight: 101.3 kg (223 lb 5.2 oz) IBW/kg (Calculated) : 63.8 TPN AdjBW (KG): 72.6 Body mass index is 36.05 kg/m. Usual Weight: 85-92 kg   Assessment: Patient with history of heavy alcohol  use presented with fall on stairs, acute respiratory failure requiring mechanical ventilation, L rib fractures, L hemopneumothorax, L pulmonary contusion. Patient also developed Ileus, NGT to suction. Minimal nutrition since admission 4/25. Pharmacy consulted to dose TPN.   Tube feeds to start at trickle today 5/15.   Glucose / Insulin : CBGs < 180, no history of diabetes, 8 units of insulin /24 hours  Electrolytes: Electrolytes WNL   Renal: Scr < 1.0 Hepatic: TG 106 5/12, LFTs trending up 5/15 (recheck Monday 5/19) - Alk phos 156, AST 145, ALT 279, Tbili 0.7 Intake / Output; MIVF: LBM 5/15 UOP 0.7 mL/kg/h (total 1.68 L) Net IO Since Admission: 12,289.49 mL [02/14/24 0715]  GI Imaging:  5/13 CT c/ab/p distended stomach, small bowel, and colon filled with fluid and air  5/4 KUB Findings suggest ileus  4/25 CT Chest/Ab/Pelv Stomach/Bowel: Unremarkable GI Surgeries / Procedures: n/a   Central access: PICC Line ordered 5/5  TPN start date: 5/5   Nutritional Goals: Goal TPN rate is 80 mL/hr (provides 105 g of protein and 1977 kcals per day)  RD Assessment: Estimated Needs Total Energy Estimated Needs: 1900-2100 Total Protein Estimated Needs: 105-120g Total Fluid Estimated Needs: >/=1.9L  Current Nutrition:  Tube feeds @ 20 cc/hr   Plan:  Given slight rise in LFTs on 5/15, will cycle TPN  Cyclic rate 56 mL/hr x 1 hour >> 113 mL/hr x 16 hours >> 56 mL/hr x 1 hour  TPN provides 100% of goal needs Electrolytes in TPN: Na 50 mEq/L, K 40 mEq/L, Ca 51mEq/L, Mg 10 mEq/L, and Phos 5 mmol/L. Cl:Ac 1:1 Standard MVI and trace elements in TPN Monitor TPN labs on  Mon/Thurs Multivitamin back to per tube and remove from TPN   Fluids per Surgery  F/u advancement of TF, Reglan added today and mag citrate x 1   SSI timed 2 hrs past cyclic TPN start + 1 hr past cycle TPN d/c'ed + middle of TPN infusion + while off TPN (total 4 CBGs/24 hr period)  Jason Rojas, PharmD, BCPS, BCCCP Clinical Pharmacist

## 2024-02-14 NOTE — Progress Notes (Signed)
   02/14/24 0736  Daily Weaning Assessment  Daily Assessment of Readiness to Wean Wean protocol criteria met (SBT performed)  SBT Method CPAP 5 cm H20 and PS 5 cm H20 (PS 15)  Weaning Start Time 0736   Pt was placed on PS/CPAP but did not tolerate due to increase RR> 40 and increased agitation. Pt was placed back on full support. MD aware of high peak pressures.

## 2024-02-14 NOTE — Progress Notes (Signed)
 PT Cancellation Note  Patient Details Name: RYLEN TOMITA MRN: [id number] DOB: 12-25-1960   Cancelled Treatment:    Reason Eval/Treat Not Completed: (P) Medical issues which prohibited therapy;Patient not medically ready (RN defer, pt sedation (precedex ) increased and per RN also given paralytic type med so would not be able to participate at this time.) Will defer PT session this date and re-attempt once medically appropriate.   Mariel Shope Maylie Ashton 02/14/2024, 11:52 AM

## 2024-02-14 NOTE — Progress Notes (Signed)
 Nutrition Follow-up  DOCUMENTATION CODES:   Not applicable  INTERVENTION:  TPN to meet 100% of estimated nutrition needs until adequate enteral nutrition can be established If pt continue to tolerate trickle feeds and trauma service ok with advancing, ok to begin weaning TPN from a nutrition adequacy perspective Continue trickle tube feeding: Vital 1.5 at 54ml/hr (720kcal, 32g protein, and free water) Once tolerance established, recommend increasing by 10ml q12h to goal rate of 16ml/hr ( per day) Add 60ml ProSource TF20 BID Provides 1960 kcal, 121g protein, per day  Consider exchanging NGT for cortrak once suction is no longer needed  NUTRITION DIAGNOSIS: Inadequate oral intake related to acute illness, altered GI function as evidenced by NPO status. - remains applicable  GOAL:   Patient will meet greater than or equal to 90% of their needs - goal met via TPN  MONITOR:   Labs, Weight trends, Vent status  REASON FOR ASSESSMENT:   Consult, Ventilator New TPN/TNA  ASSESSMENT:   Pt admitted after a fall on stairs leading to L rib fractures 3-10, hemopneumothorax and pulmonary contusion. PMH significant for COPD, HTN, prostate cancer and ETOH use.  5/2: re-intubated d/t hypoxia and more obtunded 5/4: abdominal xray- findings suggest ileus 5/12: s/p trach placement; Cortrak placed 5/13: emesis; CT abdomen/pelvis- stomach distended with fluid and air; no obstruction 5/15: trickle feeds initiated via NGT, TPN adjusted to cyclic  Patient is currently intubated on ventilator support via trach. Abdomen remains distended, but tolerated trickle feeds over the last 24 hours. Per trauma service - will continue today to monitor for tolerance and then can likely begin to advance. RN reports that bowel regimen was increased today and magnesium  citrate has been administered.   TPN being cycled over 20 hours today, infusion time further decreased to 16 hours with bag at  1800. If TF are tolerated and can begin being advanced, would be in favor of weaning TPN.   Pt with tape and clip holding NGT in place but appear to be losing their adhesive grip. RN reports that pt did pull his tube out partially earlier in the week and it had to be replaced. Placed bridle to secure NGT. Tube secured at 66cm at the right nare.   MV: 16.9 L/min Temp (24hrs), Avg:98.2 F (36.8 C), Min:97.8 F (36.6 C), Max:99.1 F (37.3 C) MAP (cuff):  Admit weight: 92 kg (likely stated) First measured weight (5/5): 87.6 kg Current weight: 101.3 kg + edema   Drains/lines: Triple lumen PICC UOP: x 24 hours NG 16 Fr. (tip within stomach)- output x24 hours   Medications:  Scheduled Meds:  docusate  100 mg Per Tube BID   folic acid   1 mg Per Tube Daily   insulin  aspart  0-15 Units Subcutaneous 4 times per day   metoCLOPramide   10 mg Intravenous Q8H   multivitamin with minerals  1 tablet Per Tube Daily   polyethylene glycol  17 g Per Tube Daily   Continuous Infusions:  dexmedetomidine  (PRECEDEX ) IV infusion     feeding supplement (VITAL 1.5 CAL) 20 mL/hr at 02/14/24 1100   lactated ringers  63 mL/hr at 02/14/24 1100   TPN CYCLIC-ADULT (ION) 101 mL/hr at 02/14/24 1100   TPN CYCLIC-ADULT (ION)     PRN Meds: bisacodyl , ondansetron , polyethylene glycol   Labs reviewed: Creatinine 0.46 CBG's 105-157 x 24 hours HgbA1c 5.7% (5/12)  Diet Order:   Diet Order  Diet NPO time specified  Diet effective now                   EDUCATION NEEDS:  No education needs have been identified at this time  Skin:  Skin Assessment: Reviewed RN Assessment (closed neck incision (trach))  Last BM:  5/15 - type 7  Height:  Ht Readings from Last 1 Encounters:  02/08/24 5\' 6"  (1.676 m)    Weight:  Wt Readings from Last 1 Encounters:  02/14/24 101.3 kg    Ideal Body Weight:  64.5 kg  BMI:  Body mass index is 36.05 kg/m.  Estimated Nutritional  Needs:  Kcal:  1900-2100 Protein:  105-120g Fluid:  >/=1.9L   Edwena Graham, RD, LDN Registered Dietitian II Please reach out via secure chat

## 2024-02-14 NOTE — Plan of Care (Signed)
  Problem: Clinical Measurements: Goal: Will remain free from infection Outcome: Progressing Goal: Cardiovascular complication will be avoided Outcome: Progressing   Problem: Elimination: Goal: Will not experience complications related to urinary retention Outcome: Progressing   Problem: Pain Managment: Goal: General experience of comfort will improve and/or be controlled Outcome: Progressing   Problem: Safety: Goal: Ability to remain free from injury will improve Outcome: Progressing   Problem: Skin Integrity: Goal: Risk for impaired skin integrity will decrease Outcome: Progressing   Problem: Clinical Measurements: Goal: Respiratory complications will improve Outcome: Not Progressing   Problem: Elimination: Goal: Will not experience complications related to bowel motility Outcome: Not Progressing

## 2024-02-15 LAB — BASIC METABOLIC PANEL WITH GFR
Anion gap: 8 (ref 5–15)
BUN: 19 mg/dL (ref 8–23)
CO2: 24 mmol/L (ref 22–32)
Calcium: 8.8 mg/dL — ABNORMAL LOW (ref 8.9–10.3)
Chloride: 106 mmol/L (ref 98–111)
Creatinine, Ser: 0.39 mg/dL — ABNORMAL LOW (ref 0.61–1.24)
GFR, Estimated: >60 mL/min (ref >60)
Glucose, Bld: 143 mg/dL — ABNORMAL HIGH (ref 70–99)
Potassium: 4.1 mmol/L (ref 3.5–5.1)
Sodium: 138 mmol/L (ref 135–145)

## 2024-02-15 LAB — CBC
HCT: 26.2 % — ABNORMAL LOW (ref 39.0–52.0)
Hemoglobin: 8.1 g/dL — ABNORMAL LOW (ref 13.0–17.0)
MCH: 26.5 pg (ref 26.0–34.0)
MCHC: 30.9 g/dL (ref 30.0–36.0)
MCV: 85.6 fL (ref 80.0–100.0)
Platelets: 318 10*3/uL (ref 150–400)
RBC: 3.06 MIL/uL — ABNORMAL LOW (ref 4.22–5.81)
RDW: 14.6 % (ref 11.5–15.5)
WBC: 9.3 10*3/uL (ref 4.0–10.5)
nRBC: 0 % (ref 0.0–0.2)

## 2024-02-15 LAB — GLUCOSE, CAPILLARY
Glucose-Capillary: 116 mg/dL — ABNORMAL HIGH (ref 70–99)
Glucose-Capillary: 137 mg/dL — ABNORMAL HIGH (ref 70–99)
Glucose-Capillary: 139 mg/dL — ABNORMAL HIGH (ref 70–99)
Glucose-Capillary: 143 mg/dL — ABNORMAL HIGH (ref 70–99)
Glucose-Capillary: 144 mg/dL — ABNORMAL HIGH (ref 70–99)
Glucose-Capillary: 99 mg/dL (ref 70–99)

## 2024-02-15 LAB — TRIGLYCERIDES: Triglycerides: 83 mg/dL (ref <150)

## 2024-02-15 MED ORDER — IPRATROPIUM-ALBUTEROL 0.5-2.5 (3) MG/3ML IN SOLN
3.0000 mL | Freq: Three times a day (TID) | RESPIRATORY_TRACT | Status: DC
  Administered 2024-02-15 – 2024-02-21 (×18): 3 mL via RESPIRATORY_TRACT
  Filled 2024-02-15 (×18): qty 3

## 2024-02-15 MED ORDER — TRAVASOL 10 % IV SOLN
INTRAVENOUS | Status: AC
  Filled 2024-02-15: qty 1176

## 2024-02-15 MED ORDER — FUROSEMIDE 10 MG/ML IJ SOLN
20.0000 mg | Freq: Two times a day (BID) | INTRAMUSCULAR | Status: AC
  Administered 2024-02-15 – 2024-02-16 (×2): 20 mg via INTRAVENOUS
  Filled 2024-02-15 (×2): qty 2

## 2024-02-15 MED ORDER — ACETAMINOPHEN 10 MG/ML IV SOLN
1000.0000 mg | Freq: Four times a day (QID) | INTRAVENOUS | Status: AC
Start: 2024-02-15 — End: 2024-02-16
  Administered 2024-02-15 – 2024-02-16 (×3): 1000 mg via INTRAVENOUS
  Filled 2024-02-15 (×3): qty 100

## 2024-02-15 NOTE — Progress Notes (Addendum)
 PHARMACY - TOTAL PARENTERAL NUTRITION CONSULT NOTE   Indication: Prolonged ileus  Patient Measurements: Height: 5\' 6"  (167.6 cm) Weight: 103.4 kg (227 lb 15.3 oz) IBW/kg (Calculated) : 63.8 TPN AdjBW (KG): 72.6 Body mass index is 36.79 kg/m. Usual Weight: 85-92 kg   Assessment: Patient with history of heavy alcohol  use presented with fall on stairs, acute respiratory failure requiring mechanical ventilation, L rib fractures, L hemopneumothorax, L pulmonary contusion. Patient also developed Ileus, NGT to suction. Minimal nutrition since admission 4/25. Pharmacy consulted to dose TPN.   Glucose / Insulin : CBGs < 180 controlled, no history of diabetes, 10 units of insulin /24 hours  Electrolytes: Electrolytes WNL; CoCa 10 Renal: Scr < 1.0; BUN 19 Hepatic: TG 83 on 5/17, LFTs trending up 5/15 (recheck Monday 5/19) - Alk phos 156, AST 145, ALT 279, Tbili 0.7 Intake / Output; MIVF: LBM 16 x4 - Reglan  10 q8h; NGT output 900 mL (up); UOP 0.5 mL/kg/h (total 1.12 L) Net IO Since Admission: 42,595.63 mL [02/15/24 0706]  GI Imaging:  5/13 CT c/ab/p distended stomach, small bowel, and colon filled with fluid and air  5/4 KUB Findings suggest ileus  4/25 CT Chest/Ab/Pelv Stomach/Bowel: Unremarkable GI Surgeries / Procedures: n/a   Central access: PICC Line ordered 5/5  TPN start date: 5/5   Nutritional Goals: Goal TPN rate is 80 mL/hr (provides 105 g of protein and 1977 kcals per day)  RD Assessment: Estimated Needs Total Energy Estimated Needs: 1900-2100 Total Protein Estimated Needs: 105-120g Total Fluid Estimated Needs: >/=1.9L  Current Nutrition:  TPN Tube feeds stopped (Goal 50 ml/hr) due to vomiting 5/16 AM.  Propofol  at 30 ml/hr (18.23 ml/hr - ~481.3 kcal/day (25.3% of kcal needs)  Plan:  Changed to Cyclic TPN due to mild bump in LFTs: Continue to cycle TPN over 18 hours: cycle at rate 3mL/hr x 1 hour >> 99 mL/hr x 16 hours >> 49 mL/hr x 1 hour  Removed lipids 5/17 due to  high Propofol  rate TPN + Propofol  provides 100% of goal needs Electrolytes in TPN: Na 50 mEq/L, K 40 mEq/L, Ca 59mEq/L, Mg 10 mEq/L, and Phos 5 mmol/L. Cl:Ac 1:1 Standard MVI and trace elements in TPN Monitor TPN labs on Mon/Thurs Multivitamin back to per tube and remove from TPN   Fluids per Surgery  Follow-up ability to resume TF SSI timed 2 hrs past cyclic TPN start + 1 hr past cycle TPN d/c'ed + middle of TPN infusion + while off TPN (total 4 CBGs/24 hr period)  Lenard Quam, PharmD, BCPS, BCCCP Please refer to [facility] for [facility] numbers 02/15/2024; 11:16 AM

## 2024-02-15 NOTE — Progress Notes (Addendum)
 Patient ID: Jason Rojas, male   DOB: [birth year], 62 y.o.   MRN: [id number] Follow up - Trauma Critical Care   Patient Details:    ZAHKI HOOGENDOORN is an 63 y.o. male.  Lines/tubes : PICC Triple Lumen 02/03/24 Right Brachial 41 cm 0 cm (Active)  Indication for Insertion or Continuance of Line Prolonged intravenous therapies 02/13/24 2000  Exposed Catheter (cm) 0 cm 02/03/24 1723  Site Assessment Clean, Dry, Intact 02/13/24 2000  Lumen #1 Status Infusing 02/13/24 2000  Lumen #2 Status Infusing 02/13/24 2000  Lumen #3 Status Infusing 02/13/24 2000  Dressing Type Transparent 02/13/24 2000  Dressing Status Antimicrobial disc/dressing in place 02/13/24 2000  Line Care Connections checked and tightened 02/13/24 2000  Line Adjustment (NICU/IV Team Only) No 02/03/24 1723  Dressing Intervention New dressing 02/13/24 1200  Dressing Change Due 02/20/24 02/13/24 2000     NG/OG Vented/Dual Lumen 16 Fr. Right nare Marking at nare/corner of mouth 65 cm (Active)  Tube Position (Required) Marking at nare/corner of mouth 02/14/24 0000  Measurement (cm) (Required) 68 cm 02/14/24 0000  Ongoing Placement Verification (Required) (See row information) Yes 02/14/24 0000  Site Assessment Clean, Dry, Intact 02/14/24 0000  Interventions Clamped 02/12/24 1823  Status Feeding 02/14/24 0000  Intake (mL) 100 mL 02/12/24 2200  Output (mL) 400 mL 02/12/24 1800     Urethral Catheter [name] Latex 16 Fr. (Active)  Indication for Insertion or Continuance of Catheter Acute urinary retention (I&O Cath for 24 hrs prior to catheter insertion- Inpatient Only) 02/13/24 1939  Site Assessment Clean, Dry, Intact 02/13/24 1939  Catheter Maintenance Bag below level of bladder;Catheter secured;Drainage bag/tubing not touching floor;Insertion date on drainage bag;No dependent loops;Seal intact 02/13/24 1939  Collection Container Standard drainage bag 02/13/24 1939  Securement Method Adhesive securement device 02/13/24 1939   Urinary Catheter Interventions (if applicable) Unclamped 02/13/24 0800  Output (mL) 17 mL 02/14/24 0500    Microbiology/Sepsis markers: Results for orders placed or performed during the hospital encounter of 01/24/24  MRSA Next Gen by PCR, Nasal     Status: None   Collection Time: 01/24/24  6:25 PM   Specimen: Nasal Mucosa; Nasal Swab  Result Value Ref Range Status   MRSA by PCR Next Gen NOT DETECTED NOT DETECTED Final    Comment: (NOTE) The GeneXpert MRSA Assay (FDA approved for NASAL specimens only), is one component of a comprehensive MRSA colonization surveillance program. It is not intended to diagnose MRSA infection nor to guide or monitor treatment for MRSA infections. Test performance is not FDA approved in patients less than 63 years old. Performed at [facility], [address], [place], [place] [address]   Culture, Respiratory w Gram Stain     Status: None   Collection Time: 02/01/24  6:32 AM   Specimen: Tracheal Aspirate; Respiratory  Result Value Ref Range Status   Specimen Description TRACHEAL ASPIRATE  Final   Special Requests NONE  Final   Gram Stain   Final    RARE SQUAMOUS EPITHELIAL CELLS PRESENT WBC PRESENT, PREDOMINANTLY PMN ABUNDANT GRAM POSITIVE COCCI IN PAIRS IN CHAINS IN CLUSTERS Performed at [facility], [address] [place], [place] [address]    Culture MODERATE STREPTOCOCCUS PNEUMONIAE  Final   Report Status 02/03/2024 FINAL  Final   Organism ID, Bacteria STREPTOCOCCUS PNEUMONIAE  Final      Susceptibility   Streptococcus pneumoniae - MIC*    ERYTHROMYCIN >=8 RESISTANT Resistant     LEVOFLOXACIN  0.5 SENSITIVE Sensitive     VANCOMYCIN 0.5 SENSITIVE Sensitive     PENICILLIN (meningitis) 0.25 RESISTANT Resistant     PENO - penicillin 0.25      PENICILLIN (non-meningitis) 0.25 SENSITIVE Sensitive     PENICILLIN (oral) 0.25 INTERMEDIATE Intermediate     CEFTRIAXONE  (non-meningitis) 0.25 SENSITIVE Sensitive     CEFTRIAXONE   (meningitis) 0.25 SENSITIVE Sensitive     * MODERATE STREPTOCOCCUS PNEUMONIAE    Anti-infectives:  Anti-infectives (From admission, onward)    Start     Dose/Rate Route Frequency Ordered Stop   02/03/24 1000  cefTRIAXone  (ROCEPHIN ) 2 g in sodium chloride  0.9 % 100 mL IVPB        2 g 200 mL/hr over 30 Minutes Intravenous Every 24 hours 02/03/24 0818 02/08/24 1059   02/02/24 0930  ceFEPIme  (MAXIPIME ) 2 g in sodium chloride  0.9 % 100 mL IVPB  Status:  Discontinued        2 g 200 mL/hr over 30 Minutes Intravenous Every 8 hours 02/02/24 0835 02/03/24 0818   01/24/24 0900  ceFAZolin  (ANCEF ) IVPB 2g/100 mL premix        2 g 200 mL/hr over 30 Minutes Intravenous STAT 01/24/24 0854 01/24/24 0941     Consults: Treatment Team:  Md, Trauma, MD    Studies:    Events:  Subjective:    Overnight Issues:  NG with >900cc/24hrs; 1 decent BM per nurse Objective:  Vital signs for last 24 hours: Temp:  [97.8 F (36.6 C)-98.4 F (36.9 C)] 98.4 F (36.9 C) (05/17 0830) Pulse Rate:  [59-85] 64 (05/17 1000) Resp:  [9-36] 29 (05/17 1000) BP: (110-157)/(64-84) 135/75 (05/17 1000) SpO2:  [94 %-98 %] 95 % (05/17 1000) FiO2 (%):  [40 %] 40 % (05/17 0836) Weight:  [103.4 kg] 103.4 kg (05/17 0400)  Hemodynamic parameters for last 24 hours:    Intake/Output from previous day: 05/16 0701 - 05/17 0700 In: 5184.2 [I.V.:4832.2; NG/GT:352] Out: 2025 [Urine:1125; Emesis/NG output:900]  Intake/Output this shift: Total I/O In: 732.1 [I.V.:732.1] Out: -   Vent settings for last 24 hours: Vent Mode: PRVC FiO2 (%):  [40 %] 40 % Set Rate:  [18 bmp] 18 bmp Vt Set:  [510 mL] 510 mL PEEP:  [5 cmH20] 5 cmH20 Plateau Pressure:  [26 cmH20-39 cmH20] 28 cmH20  Physical Exam:  General: on vent Neuro: sedated HEENT/Neck: trach-clean, intact Resp: some rhonchi B CVS: RRR GI: quite distended, NT Extremities: 2+ edema R>L LLE  Results for orders placed or performed during the hospital encounter of  01/24/24 (from the past 24 hours)  Glucose, capillary     Status: Abnormal   Collection Time: 02/14/24 11:24 AM  Result Value Ref Range   Glucose-Capillary 152 (H) 70 - 99 mg/dL  Glucose, capillary     Status: Abnormal   Collection Time: 02/14/24  3:03 PM  Result Value Ref Range   Glucose-Capillary 110 (H) 70 - 99 mg/dL  Glucose, capillary     Status: Abnormal   Collection Time: 02/14/24  8:07 PM  Result Value Ref Range   Glucose-Capillary 137 (H) 70 - 99 mg/dL  Glucose, capillary     Status: Abnormal   Collection Time: 02/15/24 12:31 AM  Result Value Ref Range   Glucose-Capillary 144 (H) 70 - 99 mg/dL  Glucose, capillary     Status: Abnormal   Collection Time: 02/15/24  3:55 AM  Result Value Ref Range   Glucose-Capillary 143 (H) 70 - 99 mg/dL  CBC  Status: Abnormal   Collection Time: 02/15/24  5:35 AM  Result Value Ref Range   WBC 9.3 4.0 - 10.5 K/uL   RBC 3.06 (L) 4.22 - 5.81 MIL/uL   Hemoglobin 8.1 (L) 13.0 - 17.0 g/dL   HCT 40.9 (L) 81.1 - 91.4 %   MCV 85.6 80.0 - 100.0 fL   MCH 26.5 26.0 - 34.0 pg   MCHC 30.9 30.0 - 36.0 g/dL   RDW 78.2 95.6 - 21.3 %   Platelets 318 150 - 400 K/uL   nRBC 0.0 0.0 - 0.2 %  Basic metabolic panel with GFR     Status: Abnormal   Collection Time: 02/15/24  5:35 AM  Result Value Ref Range   Sodium 138 135 - 145 mmol/L   Potassium 4.1 3.5 - 5.1 mmol/L   Chloride 106 98 - 111 mmol/L   CO2 24 22 - 32 mmol/L   Glucose, Bld 143 (H) 70 - 99 mg/dL   BUN 19 8 - 23 mg/dL   Creatinine, Ser 0.86 (L) 0.61 - 1.24 mg/dL   Calcium 8.8 (L) 8.9 - 10.3 mg/dL   GFR, Estimated >57 >84 mL/min   Anion gap 8 5 - 15  Triglycerides     Status: None   Collection Time: 02/15/24  5:35 AM  Result Value Ref Range   Triglycerides 83 <150 mg/dL  Glucose, capillary     Status: Abnormal   Collection Time: 02/15/24  8:28 AM  Result Value Ref Range   Glucose-Capillary 139 (H) 70 - 99 mg/dL    Assessment & Plan: Present on Admission:  Fall    LOS: 22 days    Additional comments:I reviewed the patient's new clinical lab test results. CXR 5/16; consultant notes from 5/16; discussed TPN with pharm D.  63 y/o M hx of fall on stairs   Acute hypoxic ventilator dependent respiratory failure  - continue weaning as able, trach 5/12-proximal XLT. 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 On ADT w/ Zytiga/prednisone  daily at home - will resume once can take PO (cannot be given per tube) Acute urinary retention  - foley, U/O improved some, CRT good   FEN - TNA, ileus persists though having some BMs, mod NG output; hold TF, repeat abd xray in am VTE - SCD's, LMWH ID - completed Rocephin  for strep pneumo PNA, afeb and WBC WNL Dispo - ICU, vent wean, PT/OT/SLP as able; start lasix, ileus persists, cont TPN Critical Care Total Time*: 30 Minutes  Kenitra Leventhal M. Elvan Hamel, MD, FACS General, Bariatric, & Minimally Invasive Surgery [facility],  [facility]   02/15/2024  *Care during the described time interval was provided by me. I have reviewed this patient's available data, including medical history, events of note, physical examination and test results as part of my evaluation.

## 2024-02-15 NOTE — Plan of Care (Signed)
  Problem: Clinical Measurements: Goal: Ability to maintain clinical measurements within normal limits will improve Outcome: Progressing Goal: Will remain free from infection Outcome: Progressing Goal: Diagnostic test results will improve Outcome: Progressing   Problem: Nutrition: Goal: Adequate nutrition will be maintained Outcome: Progressing   Problem: Coping: Goal: Level of anxiety will decrease Outcome: Progressing   Problem: Elimination: Goal: Will not experience complications related to bowel motility Outcome: Progressing Goal: Will not experience complications related to urinary retention Outcome: Progressing   Problem: Pain Managment: Goal: General experience of comfort will improve and/or be controlled Outcome: Progressing   Problem: Safety: Goal: Ability to remain free from injury will improve Outcome: Progressing   Problem: Skin Integrity: Goal: Risk for impaired skin integrity will decrease Outcome: Progressing   Problem: Education: Goal: Ability to describe self-care measures that may prevent or decrease complications (Diabetes Survival Skills Education) will improve Outcome: Progressing Goal: Individualized Educational Video(s) Outcome: Progressing   Problem: Coping: Goal: Ability to adjust to condition or change in health will improve Outcome: Progressing   Problem: Fluid Volume: Goal: Ability to maintain a balanced intake and output will improve Outcome: Progressing   Problem: Metabolic: Goal: Ability to maintain appropriate glucose levels will improve Outcome: Progressing   Problem: Nutritional: Goal: Maintenance of adequate nutrition will improve Outcome: Progressing   Problem: Skin Integrity: Goal: Risk for impaired skin integrity will decrease Outcome: Progressing   Problem: Tissue Perfusion: Goal: Adequacy of tissue perfusion will improve Outcome: Progressing   Problem: Safety: Goal: Non-violent Restraint(s) Outcome:  Progressing

## 2024-02-16 ENCOUNTER — Inpatient Hospital Stay (HOSPITAL_COMMUNITY)

## 2024-02-16 LAB — CBC
HCT: 23.6 % — ABNORMAL LOW (ref 39.0–52.0)
Hemoglobin: 7.3 g/dL — ABNORMAL LOW (ref 13.0–17.0)
MCH: 26.8 pg (ref 26.0–34.0)
MCHC: 30.9 g/dL (ref 30.0–36.0)
MCV: 86.8 fL (ref 80.0–100.0)
Platelets: 286 10*3/uL (ref 150–400)
RBC: 2.72 MIL/uL — ABNORMAL LOW (ref 4.22–5.81)
RDW: 14.6 % (ref 11.5–15.5)
WBC: 7.7 10*3/uL (ref 4.0–10.5)
nRBC: 0 % (ref 0.0–0.2)

## 2024-02-16 LAB — BASIC METABOLIC PANEL WITH GFR
Anion gap: 6 (ref 5–15)
BUN: 19 mg/dL (ref 8–23)
CO2: 22 mmol/L (ref 22–32)
Calcium: 8 mg/dL — ABNORMAL LOW (ref 8.9–10.3)
Chloride: 103 mmol/L (ref 98–111)
Creatinine, Ser: 0.44 mg/dL — ABNORMAL LOW (ref 0.61–1.24)
GFR, Estimated: >60 mL/min (ref >60)
Glucose, Bld: 737 mg/dL (ref 70–99)
Potassium: 5.3 mmol/L — ABNORMAL HIGH (ref 3.5–5.1)
Sodium: 131 mmol/L — ABNORMAL LOW (ref 135–145)

## 2024-02-16 LAB — GLUCOSE, CAPILLARY
Glucose-Capillary: 115 mg/dL — ABNORMAL HIGH (ref 70–99)
Glucose-Capillary: 115 mg/dL — ABNORMAL HIGH (ref 70–99)
Glucose-Capillary: 115 mg/dL — ABNORMAL HIGH (ref 70–99)
Glucose-Capillary: 118 mg/dL — ABNORMAL HIGH (ref 70–99)
Glucose-Capillary: 128 mg/dL — ABNORMAL HIGH (ref 70–99)
Glucose-Capillary: 129 mg/dL — ABNORMAL HIGH (ref 70–99)
Glucose-Capillary: 89 mg/dL (ref 70–99)

## 2024-02-16 LAB — COMPREHENSIVE METABOLIC PANEL WITH GFR
ALT: 229 U/L — ABNORMAL HIGH (ref 0–44)
AST: 36 U/L (ref 15–41)
Albumin: 1.9 g/dL — ABNORMAL LOW (ref 3.5–5.0)
Alkaline Phosphatase: 144 U/L — ABNORMAL HIGH (ref 38–126)
Anion gap: 6 (ref 5–15)
BUN: 18 mg/dL (ref 8–23)
CO2: 25 mmol/L (ref 22–32)
Calcium: 7.9 mg/dL — ABNORMAL LOW (ref 8.9–10.3)
Chloride: 105 mmol/L (ref 98–111)
Creatinine, Ser: 0.37 mg/dL — ABNORMAL LOW (ref 0.61–1.24)
GFR, Estimated: >60 mL/min (ref >60)
Glucose, Bld: 116 mg/dL — ABNORMAL HIGH (ref 70–99)
Potassium: 3.9 mmol/L (ref 3.5–5.1)
Sodium: 136 mmol/L (ref 135–145)
Total Bilirubin: 0.5 mg/dL (ref 0.0–1.2)
Total Protein: 5.3 g/dL — ABNORMAL LOW (ref 6.5–8.1)

## 2024-02-16 MED ORDER — TRAVASOL 10 % IV SOLN
INTRAVENOUS | Status: AC
Start: 2024-02-16 — End: 2024-02-17
  Filled 2024-02-16: qty 1176

## 2024-02-16 MED ORDER — SODIUM CHLORIDE 0.9 % IV SOLN: INTRAVENOUS | Status: AC

## 2024-02-16 MED ORDER — FUROSEMIDE 10 MG/ML IJ SOLN
40.0000 mg | Freq: Two times a day (BID) | INTRAMUSCULAR | Status: AC
  Administered 2024-02-16 (×2): 40 mg via INTRAVENOUS
  Filled 2024-02-16 (×2): qty 4

## 2024-02-16 NOTE — Progress Notes (Signed)
 Patient ID: Jason Rojas, male   DOB: [birth year], 63 y.o.   MRN: [id number] Follow up - Trauma Critical Care   Patient Details:    Jason Rojas is an 63 y.o. male.  Lines/tubes : PICC Triple Lumen 02/03/24 Right Brachial 41 cm 0 cm (Active)  Indication for Insertion or Continuance of Line Prolonged intravenous therapies 02/13/24 2000  Exposed Catheter (cm) 0 cm 02/03/24 1723  Site Assessment Clean, Dry, Intact 02/13/24 2000  Lumen #1 Status Infusing 02/13/24 2000  Lumen #2 Status Infusing 02/13/24 2000  Lumen #3 Status Infusing 02/13/24 2000  Dressing Type Transparent 02/13/24 2000  Dressing Status Antimicrobial disc/dressing in place 02/13/24 2000  Line Care Connections checked and tightened 02/13/24 2000  Line Adjustment (NICU/IV Team Only) No 02/03/24 1723  Dressing Intervention New dressing 02/13/24 1200  Dressing Change Due 02/20/24 02/13/24 2000     NG/OG Vented/Dual Lumen 16 Fr. Right nare Marking at nare/corner of mouth 65 cm (Active)  Tube Position (Required) Marking at nare/corner of mouth 02/14/24 0000  Measurement (cm) (Required) 68 cm 02/14/24 0000  Ongoing Placement Verification (Required) (See row information) Yes 02/14/24 0000  Site Assessment Clean, Dry, Intact 02/14/24 0000  Interventions Clamped 02/12/24 1823  Status Feeding 02/14/24 0000  Intake (mL) 100 mL 02/12/24 2200  Output (mL) 400 mL 02/12/24 1800     Urethral Catheter [name] Latex 16 Fr. (Active)  Indication for Insertion or Continuance of Catheter Acute urinary retention (I&O Cath for 24 hrs prior to catheter insertion- Inpatient Only) 02/13/24 1939  Site Assessment Clean, Dry, Intact 02/13/24 1939  Catheter Maintenance Bag below level of bladder;Catheter secured;Drainage bag/tubing not touching floor;Insertion date on drainage bag;No dependent loops;Seal intact 02/13/24 1939  Collection Container Standard drainage bag 02/13/24 1939  Securement Method Adhesive securement device 02/13/24 1939   Urinary Catheter Interventions (if applicable) Unclamped 02/13/24 0800  Output (mL) 17 mL 02/14/24 0500    Microbiology/Sepsis markers: Results for orders placed or performed during the hospital encounter of 01/24/24  MRSA Next Gen by PCR, Nasal     Status: None   Collection Time: 01/24/24  6:25 PM   Specimen: Nasal Mucosa; Nasal Swab  Result Value Ref Range Status   MRSA by PCR Next Gen NOT DETECTED NOT DETECTED Final    Comment: (NOTE) The GeneXpert MRSA Assay (FDA approved for NASAL specimens only), is one component of a comprehensive MRSA colonization surveillance program. It is not intended to diagnose MRSA infection nor to guide or monitor treatment for MRSA infections. Test performance is not FDA approved in patients less than 39 years old. Performed at [facility], [address] [place], [place] [address]   Culture, Respiratory w Gram Stain     Status: None   Collection Time: 02/01/24  6:32 AM   Specimen: Tracheal Aspirate; Respiratory  Result Value Ref Range Status   Specimen Description TRACHEAL ASPIRATE  Final   Special Requests NONE  Final   Gram Stain   Final    RARE SQUAMOUS EPITHELIAL CELLS PRESENT WBC PRESENT, PREDOMINANTLY PMN ABUNDANT GRAM POSITIVE COCCI IN PAIRS IN CHAINS IN CLUSTERS Performed at [facility], [address], [place], [place] [address]    Culture MODERATE STREPTOCOCCUS PNEUMONIAE  Final   Report Status 02/03/2024 FINAL  Final   Organism ID, Bacteria STREPTOCOCCUS PNEUMONIAE  Final      Susceptibility   Streptococcus pneumoniae - MIC*    ERYTHROMYCIN >=8 RESISTANT Resistant     LEVOFLOXACIN  0.5 SENSITIVE Sensitive     VANCOMYCIN 0.5 SENSITIVE Sensitive     PENICILLIN (meningitis) 0.25 RESISTANT Resistant     PENO - penicillin 0.25      PENICILLIN (non-meningitis) 0.25 SENSITIVE Sensitive     PENICILLIN (oral) 0.25 INTERMEDIATE Intermediate     CEFTRIAXONE  (non-meningitis) 0.25 SENSITIVE Sensitive     CEFTRIAXONE   (meningitis) 0.25 SENSITIVE Sensitive     * MODERATE STREPTOCOCCUS PNEUMONIAE    Anti-infectives:  Anti-infectives (From admission, onward)    Start     Dose/Rate Route Frequency Ordered Stop   02/03/24 1000  cefTRIAXone  (ROCEPHIN ) 2 g in sodium chloride  0.9 % 100 mL IVPB        2 g 200 mL/hr over 30 Minutes Intravenous Every 24 hours 02/03/24 0818 02/08/24 1059   02/02/24 0930  ceFEPIme  (MAXIPIME ) 2 g in sodium chloride  0.9 % 100 mL IVPB  Status:  Discontinued        2 g 200 mL/hr over 30 Minutes Intravenous Every 8 hours 02/02/24 0835 02/03/24 0818   01/24/24 0900  ceFAZolin  (ANCEF ) IVPB 2g/100 mL premix        2 g 200 mL/hr over 30 Minutes Intravenous STAT 01/24/24 0854 01/24/24 0941     Consults: Treatment Team:  Md, Trauma, MD    Studies:    Events:  Subjective:    Overnight Issues:  NG output unclear - not recorded overnight; nurse reports increased flatus Objective:  Vital signs for last 24 hours: Temp:  [97.4 F (36.3 C)-98.3 F (36.8 C)] 98 F (36.7 C) (05/18 0800) Pulse Rate:  [50-74] 69 (05/18 0900) Resp:  [0-32] 25 (05/18 0900) BP: (90-139)/(56-75) 139/74 (05/18 0900) SpO2:  [91 %-98 %] 94 % (05/18 0900) FiO2 (%):  [40 %] 40 % (05/18 0400)  Hemodynamic parameters for last 24 hours:    Intake/Output from previous day: 05/17 0701 - 05/18 0700 In: 4869.3 [I.V.:4243.2; NG/GT:310; IV Piggyback:316.1] Out: 1790 [Urine:1250; Emesis/NG output:540]  Intake/Output this shift: Total I/O In: 357.4 [I.V.:357.4] Out: -   Vent settings for last 24 hours: Vent Mode: PRVC FiO2 (%):  [40 %] 40 % Set Rate:  [18 bmp] 18 bmp Vt Set:  [510 mL] 510 mL PEEP:  [5 cmH20] 5 cmH20 Plateau Pressure:  [28 cmH20-30 cmH20] 29 cmH20  Physical Exam:  General: on vent Neuro: sedated HEENT/Neck: trach-clean, intact Resp: some rhonchi B CVS: RRR GI: quite distended, NT Extremities: 1+ edema R>L LLE  Results for orders placed or performed during the hospital encounter  of 01/24/24 (from the past 24 hours)  Glucose, capillary     Status: Abnormal   Collection Time: 02/15/24 11:32 AM  Result Value Ref Range   Glucose-Capillary 137 (H) 70 - 99 mg/dL  Glucose, capillary     Status: None   Collection Time: 02/15/24  3:35 PM  Result Value Ref Range   Glucose-Capillary 99 70 - 99 mg/dL  Glucose, capillary     Status: Abnormal   Collection Time: 02/15/24  7:45 PM  Result Value Ref Range   Glucose-Capillary 116 (H) 70 - 99 mg/dL  Glucose, capillary     Status: Abnormal   Collection Time: 02/16/24 12:02 AM  Result Value Ref Range   Glucose-Capillary 118 (H) 70 - 99 mg/dL  Glucose, capillary     Status: Abnormal   Collection Time: 02/16/24  3:58 AM  Result Value Ref Range   Glucose-Capillary 115 (H) 70 - 99 mg/dL  CBC     Status: Abnormal  Collection Time: 02/16/24  4:35 AM  Result Value Ref Range   WBC 7.7 4.0 - 10.5 K/uL   RBC 2.72 (L) 4.22 - 5.81 MIL/uL   Hemoglobin 7.3 (L) 13.0 - 17.0 g/dL   HCT 45.4 (L) 09.8 - 11.9 %   MCV 86.8 80.0 - 100.0 fL   MCH 26.8 26.0 - 34.0 pg   MCHC 30.9 30.0 - 36.0 g/dL   RDW 14.7 82.9 - 56.2 %   Platelets 286 150 - 400 K/uL   nRBC 0.0 0.0 - 0.2 %  Basic metabolic panel with GFR     Status: Abnormal   Collection Time: 02/16/24  4:35 AM  Result Value Ref Range   Sodium 131 (L) 135 - 145 mmol/L   Potassium 5.3 (H) 3.5 - 5.1 mmol/L   Chloride 103 98 - 111 mmol/L   CO2 22 22 - 32 mmol/L   Glucose, Bld 737 (HH) 70 - 99 mg/dL   BUN 19 8 - 23 mg/dL   Creatinine, Ser 1.30 (L) 0.61 - 1.24 mg/dL   Calcium 8.0 (L) 8.9 - 10.3 mg/dL   GFR, Estimated >86 >57 mL/min   Anion gap 6 5 - 15  Glucose, capillary     Status: Abnormal   Collection Time: 02/16/24  5:27 AM  Result Value Ref Range   Glucose-Capillary 115 (H) 70 - 99 mg/dL  Comprehensive metabolic panel     Status: Abnormal   Collection Time: 02/16/24  5:59 AM  Result Value Ref Range   Sodium 136 135 - 145 mmol/L   Potassium 3.9 3.5 - 5.1 mmol/L   Chloride 105 98  - 111 mmol/L   CO2 25 22 - 32 mmol/L   Glucose, Bld 116 (H) 70 - 99 mg/dL   BUN 18 8 - 23 mg/dL   Creatinine, Ser 8.46 (L) 0.61 - 1.24 mg/dL   Calcium 7.9 (L) 8.9 - 10.3 mg/dL   Total Protein 5.3 (L) 6.5 - 8.1 g/dL   Albumin  1.9 (L) 3.5 - 5.0 g/dL   AST 36 15 - 41 U/L   ALT 229 (H) 0 - 44 U/L   Alkaline Phosphatase 144 (H) 38 - 126 U/L   Total Bilirubin 0.5 0.0 - 1.2 mg/dL   GFR, Estimated >96 >29 mL/min   Anion gap 6 5 - 15  Glucose, capillary     Status: Abnormal   Collection Time: 02/16/24  8:41 AM  Result Value Ref Range   Glucose-Capillary 129 (H) 70 - 99 mg/dL    Assessment & Plan: Present on Admission:  Fall    LOS: 23 days   Additional comments:I reviewed the patient's new clinical lab test results. CXR 5/16; consultant notes from 5/16; discussed TPN with pharm D.  63 y/o M hx of fall on stairs   Acute hypoxic ventilator dependent respiratory failure  - continue weaning as able, trach 5/12-proximal XLT. 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 On ADT w/ Zytiga/prednisone  daily at home - will resume once can take PO (cannot be given per tube) Acute urinary retention  - foley, U/O improved some, CRT good  FEN - TNA, ileus persists though having some BMs, mod NG output; hold TF, abd xray this am shows improved sb dilation, still with colonic gas; NG output appears to be decreasing, but not ready for enteral feeds yet VTE - SCD's, LMWH ID - completed Rocephin  for strep pneumo PNA, afeb and WBC WNL Dispo - ICU, vent wean, PT/OT/SLP as able; cont lasix, ileus persists, cont TPN Critical  Care Total Time*: 30 Minutes  Dillan Lunden M. Elvan Hamel, MD, FACS General, Bariatric, & Minimally Invasive Surgery [facility],  [facility]   02/16/2024  *Care during the described time interval was provided by me. I have reviewed this patient's available data, including medical history, events of note, physical examination and test results as part of my evaluation.

## 2024-02-16 NOTE — Plan of Care (Signed)
  Problem: Education: Goal: Knowledge of General Education information will improve Description: Including pain rating scale, medication(s)/side effects and non-pharmacologic comfort measures Outcome: Progressing   Problem: Health Behavior/Discharge Planning: Goal: Ability to manage health-related needs will improve Outcome: Progressing   Problem: Clinical Measurements: Goal: Ability to maintain clinical measurements within normal limits will improve Outcome: Progressing Goal: Will remain free from infection Outcome: Progressing Goal: Diagnostic test results will improve Outcome: Progressing Goal: Respiratory complications will improve Outcome: Progressing Goal: Cardiovascular complication will be avoided Outcome: Progressing   Problem: Activity: Goal: Risk for activity intolerance will decrease Outcome: Progressing   Problem: Nutrition: Goal: Adequate nutrition will be maintained Outcome: Progressing   Problem: Coping: Goal: Level of anxiety will decrease Outcome: Progressing   Problem: Elimination: Goal: Will not experience complications related to urinary retention Outcome: Progressing   Problem: Pain Managment: Goal: General experience of comfort will improve and/or be controlled Outcome: Progressing   Problem: Safety: Goal: Ability to remain free from injury will improve Outcome: Progressing   Problem: Skin Integrity: Goal: Risk for impaired skin integrity will decrease Outcome: Progressing   Problem: Education: Goal: Ability to describe self-care measures that may prevent or decrease complications (Diabetes Survival Skills Education) will improve Outcome: Progressing   Problem: Coping: Goal: Ability to adjust to condition or change in health will improve Outcome: Progressing   Problem: Fluid Volume: Goal: Ability to maintain a balanced intake and output will improve Outcome: Progressing   Problem: Metabolic: Goal: Ability to maintain appropriate  glucose levels will improve Outcome: Progressing   Problem: Nutritional: Goal: Maintenance of adequate nutrition will improve Outcome: Progressing   Problem: Skin Integrity: Goal: Risk for impaired skin integrity will decrease Outcome: Progressing   Problem: Tissue Perfusion: Goal: Adequacy of tissue perfusion will improve Outcome: Progressing   Problem: Safety: Goal: Non-violent Restraint(s) Outcome: Progressing

## 2024-02-16 NOTE — Progress Notes (Signed)
 PHARMACY - TOTAL PARENTERAL NUTRITION CONSULT NOTE   Indication: Prolonged ileus  Patient Measurements: Height: 5\' 6"  (167.6 cm) Weight: 103.4 kg (227 lb 15.3 oz) IBW/kg (Calculated) : 63.8 TPN AdjBW (KG): 72.6 Body mass index is 36.79 kg/m. Usual Weight: 85-92 kg   Assessment: Patient with history of heavy alcohol  use presented with fall on stairs, acute respiratory failure requiring mechanical ventilation, L rib fractures, L hemopneumothorax, L pulmonary contusion. Patient also developed Ileus, NGT to suction. Minimal nutrition since admission 4/25. Pharmacy consulted to dose TPN.   Glucose / Insulin : CBGs < 180 controlled, no history of diabetes, 5 units of insulin /24 hours  Electrolytes: Electrolytes WNL; CoCa 9.6 Renal: Scr < 1.0; BUN 18 Hepatic: TG 83 on 5/17, LFTs trending back down -Alk phos 144, AST 36, ALT 229, Tbili 0.5 Intake / Output; MIVF: LBM 5/17 (type 7) - Reglan  10 q8h; NGT output 540 mL (up); UOP 0.5 mL/kg/h (total 1.25 L) Net IO Since Admission: 19,973.15 mL [02/16/24 0805]  GI Imaging:  5/13 CT c/ab/p distended stomach, small bowel, and colon filled with fluid and air  5/4 KUB Findings suggest ileus  4/25 CT Chest/Ab/Pelv Stomach/Bowel: Unremarkable GI Surgeries / Procedures: n/a   Central access: PICC Line ordered 5/5  TPN start date: 5/5   Nutritional Goals: Goal TPN rate is 80 mL/hr (provides 105 g of protein and 1977 kcals per day)  RD Assessment: Estimated Needs Total Energy Estimated Needs: 1900-2100 Total Protein Estimated Needs: 105-120g Total Fluid Estimated Needs: >/=1.9L  Current Nutrition:  TPN Tube feeds stopped due to vomiting 5/16 AM.  Propofol  continues at 30 ml/hr (18.23 ml/hr - ~481.3 kcal/day (25.3% of kcal needs)  Plan:  Changed to Cyclic TPN due to mild bump in LFTs>>  Continue to cycle TPN over 18 hours: cycle at rate 85mL/hr x 1 hour >> 99 mL/hr x 16 hours >> 49 mL/hr x 1 hour  Removed lipids 5/17 due to high Propofol   rate TPN + Propofol  provides 100% of goal needs Electrolytes in TPN: Na 50 mEq/L, K 40 mEq/L, Ca 67mEq/L, Mg 10 mEq/L, and Phos 5 mmol/L. Cl:Ac 1:1 Standard MVI and trace elements in TPN Monitor TPN labs on Mon/Thurs Multivitamin back to per tube and remove from TPN   Fluids per Surgery  Follow-up ability to resume TF SSI timed 2 hrs past cyclic TPN start + 1 hr past cycle TPN d/c'ed + middle of TPN infusion + while off TPN (total 4 CBGs/24 hr period)  Lenard Quam, PharmD, BCPS, BCCCP Please refer to [facility] for [facility] Pharmacy numbers 02/16/2024; 8:01 AM

## 2024-02-17 ENCOUNTER — Inpatient Hospital Stay (HOSPITAL_COMMUNITY)

## 2024-02-17 LAB — CBC
HCT: 23.6 % — ABNORMAL LOW (ref 39.0–52.0)
Hemoglobin: 7.5 g/dL — ABNORMAL LOW (ref 13.0–17.0)
MCH: 26.8 pg (ref 26.0–34.0)
MCHC: 31.8 g/dL (ref 30.0–36.0)
MCV: 84.3 fL (ref 80.0–100.0)
Platelets: 271 10*3/uL (ref 150–400)
RBC: 2.8 MIL/uL — ABNORMAL LOW (ref 4.22–5.81)
RDW: 14.4 % (ref 11.5–15.5)
WBC: 7 10*3/uL (ref 4.0–10.5)
nRBC: 0 % (ref 0.0–0.2)

## 2024-02-17 LAB — TRIGLYCERIDES: Triglycerides: 194 mg/dL — ABNORMAL HIGH (ref <150)

## 2024-02-17 LAB — PHOSPHORUS: Phosphorus: 3.9 mg/dL (ref 2.5–4.6)

## 2024-02-17 LAB — COMPREHENSIVE METABOLIC PANEL WITH GFR
ALT: 142 U/L — ABNORMAL HIGH (ref 0–44)
AST: 18 U/L (ref 15–41)
Albumin: 1.7 g/dL — ABNORMAL LOW (ref 3.5–5.0)
Alkaline Phosphatase: 130 U/L — ABNORMAL HIGH (ref 38–126)
Anion gap: 7 (ref 5–15)
BUN: 19 mg/dL (ref 8–23)
CO2: 26 mmol/L (ref 22–32)
Calcium: 7.7 mg/dL — ABNORMAL LOW (ref 8.9–10.3)
Chloride: 105 mmol/L (ref 98–111)
Creatinine, Ser: 0.36 mg/dL — ABNORMAL LOW (ref 0.61–1.24)
GFR, Estimated: >60 mL/min (ref >60)
Glucose, Bld: 118 mg/dL — ABNORMAL HIGH (ref 70–99)
Potassium: 3 mmol/L — ABNORMAL LOW (ref 3.5–5.1)
Sodium: 138 mmol/L (ref 135–145)
Total Bilirubin: 0.3 mg/dL (ref 0.0–1.2)
Total Protein: 5.2 g/dL — ABNORMAL LOW (ref 6.5–8.1)

## 2024-02-17 LAB — GLUCOSE, CAPILLARY
Glucose-Capillary: 107 mg/dL — ABNORMAL HIGH (ref 70–99)
Glucose-Capillary: 116 mg/dL — ABNORMAL HIGH (ref 70–99)
Glucose-Capillary: 118 mg/dL — ABNORMAL HIGH (ref 70–99)
Glucose-Capillary: 125 mg/dL — ABNORMAL HIGH (ref 70–99)
Glucose-Capillary: 134 mg/dL — ABNORMAL HIGH (ref 70–99)
Glucose-Capillary: 91 mg/dL (ref 70–99)

## 2024-02-17 LAB — MAGNESIUM: Magnesium: 1.7 mg/dL (ref 1.7–2.4)

## 2024-02-17 MED ORDER — FUROSEMIDE 10 MG/ML IJ SOLN
40.0000 mg | Freq: Once | INTRAMUSCULAR | Status: AC
  Administered 2024-02-17: 40 mg via INTRAVENOUS
  Filled 2024-02-17: qty 4

## 2024-02-17 MED ORDER — KETAMINE HCL-SODIUM CHLORIDE 1000-0.69 MG/100ML-% IV SOLN
1.0000 mg/kg/h | INTRAVENOUS | Status: DC
  Administered 2024-02-17: 1 mg/kg/h via INTRAVENOUS
  Filled 2024-02-17 (×2): qty 100

## 2024-02-17 MED ORDER — TRAVASOL 10 % IV SOLN
INTRAVENOUS | Status: AC
  Filled 2024-02-17: qty 1176

## 2024-02-17 MED ORDER — MAGNESIUM SULFATE 2 GM/50ML IV SOLN
2.0000 g | Freq: Once | INTRAVENOUS | Status: AC
  Administered 2024-02-17: 2 g via INTRAVENOUS
  Filled 2024-02-17: qty 50

## 2024-02-17 MED ORDER — POTASSIUM CHLORIDE 10 MEQ/50ML IV SOLN
10.0000 meq | INTRAVENOUS | Status: AC
  Administered 2024-02-17 (×6): 10 meq via INTRAVENOUS
  Filled 2024-02-17 (×6): qty 50

## 2024-02-17 NOTE — Progress Notes (Signed)
 PHARMACY - TOTAL PARENTERAL NUTRITION CONSULT NOTE   Indication: Prolonged ileus  Patient Measurements: Height: 5\' 6"  (167.6 cm) Weight: 103.4 kg (227 lb 15.3 oz) IBW/kg (Calculated) : 63.8 TPN AdjBW (KG): 72.6 Body mass index is 36.79 kg/m. Usual Weight: 85-92 kg   Assessment: Patient with history of heavy alcohol  use presented with fall on stairs, acute respiratory failure requiring mechanical ventilation, L rib fractures, L hemopneumothorax, L pulmonary contusion. Patient also developed Ileus, NGT to suction. Minimal nutrition since admission 4/25. Pharmacy consulted to dose TPN.   Glucose / Insulin : CBGs < 180 controlled, no history of diabetes, 4 units of insulin /24 hours  Electrolytes: Na 138, K 3.0, Ch 105, Ca 7.7 [CoCa 9.54], Phos 3.9, Mag 1.7 Renal: Scr < 1.0; BUN 19 Hepatic: TG 194 on 5/19, LFTs trending back down -Alk phos 130, AST 18, ALT 142, Tbili wnl Intake / Output; MIVF: LBM 5/17 - Reglan  10 q8h; NGT output 1275 mL (up); UOP 1.66 mL/kg/h Net IO Since Admission: 17,698.36 mL [02/17/24 0809]  GI Imaging:  5/13 CT c/ab/p distended stomach, small bowel, and colon filled with fluid and air  5/4 KUB Findings suggest ileus  4/25 CT Chest/Ab/Pelv Stomach/Bowel: Unremarkable GI Surgeries / Procedures: n/a   Central access: PICC Line ordered 5/5  TPN start date: 5/5   Nutritional Goals: Goal TPN rate is 80 mL/hr (provides 105 g of protein and 1977 kcals per day)  RD Assessment: Estimated Needs Total Energy Estimated Needs: 1900-2100 Total Protein Estimated Needs: 105-120g Total Fluid Estimated Needs: >/=1.9L  Current Nutrition:  TPN Tube feeds stopped due to vomiting 5/16 AM.  Propofol  continues at 30 ml/hr (18.23 ml/hr - ~481.3 kcal/day (25.3% of kcal needs)  Plan:  Changed to Cyclic TPN due to mild bump in LFTs>>  Continue to cycle TPN over 18 hours: cycle at rate 67mL/hr x 1 hour >> 99 mL/hr x 16 hours >> 49 mL/hr x 1 hour  Removed lipids 5/17 due to high  Propofol  rate TPN + Propofol  provides 100% of goal needs Electrolytes in TPN: Na 50 mEq/L, K 58 mEq/L, Ca 45mEq/L, Mg 12 mEq/L, and Phos 5 mmol/L. Cl:Ac 1:2 Standard MVI and trace elements in TPN K runs x6 (iv) Mag 2 grams iv x1 BMET / Mag w/ AM labs 5/20 Monitor TPN labs on Mon/Thurs Multivitamin back to per tube and remove from TPN   Fluids per Surgery  Follow-up ability to resume TF SSI timed 2 hrs past cyclic TPN start + 1 hr past cycle TPN d/c'ed + middle of TPN infusion + while off TPN (total 4 CBGs/24 hr period)   Marleta Simmer BS, PharmD, BCPS Clinical Pharmacist 02/17/2024 8:19 AM  Contact: [phone] after 3 PM  "Be curious, not judgmental..." -Rumalda Counter

## 2024-02-17 NOTE — Progress Notes (Signed)
 SLP Cancellation Note  Patient Details Name: Jason Rojas MRN: [id number] DOB: 1961/04/25   Cancelled treatment:        Attempted to see pt for PMSV and swallowing assessments.  Pt continues to require ventilator support at this time.  Per chart review, pt unable to wean this morning.  RN reports plan to add ketamine .  SLP will follow for medical readiness for evaluation.   Elester Grim, MA, CCC-SLP Acute Rehabilitation Services Office: [phone] 02/17/2024, 8:32 AM

## 2024-02-17 NOTE — Progress Notes (Addendum)
 Patient ID: Jason Rojas, male   DOB: 07/04/1961, 63 y.o.   MRN: [id number] Follow up - Trauma Critical Care   Patient Details:    Jason Rojas is an 63 y.o. male.  Lines/tubes : PICC Triple Lumen 02/03/24 Right Brachial 41 cm 0 cm (Active)  Indication for Insertion or Continuance of Line Prolonged intravenous therapies 02/15/24 2000  Exposed Catheter (cm) 0 cm 02/03/24 1723  Site Assessment Clean, Dry, Intact 02/16/24 2000  Lumen #1 Status Infusing 02/16/24 2000  Lumen #2 Status Infusing 02/16/24 2000  Lumen #3 Status Infusing 02/16/24 2000  Dressing Type Transparent 02/16/24 2000  Dressing Status Antimicrobial disc/dressing in place;Clean, Dry, Intact 02/16/24 2000  Line Care Connections checked and tightened 02/16/24 2000  Line Adjustment (NICU/IV Team Only) No 02/03/24 1723  Dressing Intervention New dressing 02/13/24 1200  Dressing Change Due 02/20/24 02/16/24 2000     NG/OG Vented/Dual Lumen 16 Fr. Right nare Marking at nare/corner of mouth 65 cm (Active)  Tube Position (Required) Marking at nare/corner of mouth 02/16/24 0800  Measurement (cm) (Required) 68 cm 02/16/24 0800  Ongoing Placement Verification (Required) (See row information) Yes 02/16/24 0800  Site Assessment Clean, Dry, Intact;Bridle hanging straight down 02/16/24 0800  Interventions Other (Comment) 02/15/24 0800  Status Low intermittent suction 02/16/24 0800  Amount of suction 80 mmHg 02/14/24 2000  Drainage Appearance Green 02/16/24 0800  Intake (mL) 90 mL 02/15/24 1730  Output (mL) 500 mL 02/17/24 0600     External Urinary Catheter (Active)  Dedicated Suction Verified suction is between 40-80 mmHg 02/17/24 0800  Securement Method None needed 02/17/24 0800  Site Assessment Intact 02/17/24 0800  Intervention No interventions needed at this time 02/17/24 0800  Intervention No interventions needed at this time 02/17/24 0800  Output (mL) 300 mL 02/17/24 0600    Microbiology/Sepsis markers: Results for  orders placed or performed during the hospital encounter of 01/24/24  MRSA Next Gen by PCR, Nasal     Status: None   Collection Time: 01/24/24  6:25 PM   Specimen: Nasal Mucosa; Nasal Swab  Result Value Ref Range Status   MRSA by PCR Next Gen NOT DETECTED NOT DETECTED Final    Comment: (NOTE) The GeneXpert MRSA Assay (FDA approved for NASAL specimens only), is one component of a comprehensive MRSA colonization surveillance program. It is not intended to diagnose MRSA infection nor to guide or monitor treatment for MRSA infections. Test performance is not FDA approved in patients less than 68 years old. Performed at [facility], [address], [place], [place] [address]   Culture, Respiratory w Gram Stain     Status: None   Collection Time: 02/01/24  6:32 AM   Specimen: Tracheal Aspirate; Respiratory  Result Value Ref Range Status   Specimen Description TRACHEAL ASPIRATE  Final   Special Requests NONE  Final   Gram Stain   Final    RARE SQUAMOUS EPITHELIAL CELLS PRESENT WBC PRESENT, PREDOMINANTLY PMN ABUNDANT GRAM POSITIVE COCCI IN PAIRS IN CHAINS IN CLUSTERS Performed at [facility], [address] [place], [place] [address]    Culture MODERATE STREPTOCOCCUS PNEUMONIAE  Final   Report Status 02/03/2024 FINAL  Final   Organism ID, Bacteria STREPTOCOCCUS PNEUMONIAE  Final      Susceptibility   Streptococcus pneumoniae - MIC*    ERYTHROMYCIN >=8 RESISTANT Resistant     LEVOFLOXACIN 0.5 SENSITIVE Sensitive     VANCOMYCIN 0.5 SENSITIVE Sensitive     PENICILLIN (meningitis) 0.25  RESISTANT Resistant     PENO - penicillin 0.25      PENICILLIN (non-meningitis) 0.25 SENSITIVE Sensitive     PENICILLIN (oral) 0.25 INTERMEDIATE Intermediate     CEFTRIAXONE  (non-meningitis) 0.25 SENSITIVE Sensitive     CEFTRIAXONE  (meningitis) 0.25 SENSITIVE Sensitive     * MODERATE STREPTOCOCCUS PNEUMONIAE    Anti-infectives:  Anti-infectives (From admission, onward)    Start      Dose/Rate Route Frequency Ordered Stop   02/03/24 1000  cefTRIAXone  (ROCEPHIN ) 2 g in sodium chloride  0.9 % 100 mL IVPB        2 g 200 mL/hr over 30 Minutes Intravenous Every 24 hours 02/03/24 0818 02/08/24 1059   02/02/24 0930  ceFEPIme  (MAXIPIME ) 2 g in sodium chloride  0.9 % 100 mL IVPB  Status:  Discontinued        2 g 200 mL/hr over 30 Minutes Intravenous Every 8 hours 02/02/24 0835 02/03/24 0818   01/24/24 0900  ceFAZolin  (ANCEF ) IVPB 2g/100 mL premix        2 g 200 mL/hr over 30 Minutes Intravenous STAT 01/24/24 0854 01/24/24 0941      Consults: Treatment Team:  Md, Trauma, MD    Studies:    Events:  Subjective:    Overnight Issues:   Objective:  Vital signs for last 24 hours: Temp:  [98.1 F (36.7 C)] 98.1 F (36.7 C) (05/19 0741) Pulse Rate:  [54-78] 67 (05/19 0741) Resp:  [17-26] 19 (05/19 0741) BP: (107-166)/(57-83) 110/59 (05/19 0717) SpO2:  [90 %-97 %] 93 % (05/19 0741) FiO2 (%):  [40 %] 40 % (05/19 0717) Weight:  [103.9 kg] 103.9 kg (05/19 0707)  Hemodynamic parameters for last 24 hours:    Intake/Output from previous day: 05/18 0701 - 05/19 0700 In: 3350.2 [I.V.:3350.2] Out: 5425 [Urine:4150; Emesis/NG output:1275]  Intake/Output this shift: No intake/output data recorded.  Vent settings for last 24 hours: Vent Mode: PRVC FiO2 (%):  [40 %] 40 % Set Rate:  [18 bmp] 18 bmp Vt Set:  [510 mL] 510 mL PEEP:  [5 cmH20] 5 cmH20 Plateau Pressure:  [20 cmH20-27 cmH20] 26 cmH20  Physical Exam:  General: on vent Neuro: a bit agitated HEENT/Neck: trach-clean, intact Resp: rhonchi bilaterally CVS: RRR GI: distended but soft, +BM Extremities: no sig edema  Results for orders placed or performed during the hospital encounter of 01/24/24 (from the past 24 hours)  Glucose, capillary     Status: Abnormal   Collection Time: 02/16/24  8:41 AM  Result Value Ref Range   Glucose-Capillary 129 (H) 70 - 99 mg/dL  Glucose, capillary     Status: Abnormal    Collection Time: 02/16/24  1:11 PM  Result Value Ref Range   Glucose-Capillary 128 (H) 70 - 99 mg/dL  Glucose, capillary     Status: None   Collection Time: 02/16/24  4:54 PM  Result Value Ref Range   Glucose-Capillary 89 70 - 99 mg/dL  Glucose, capillary     Status: Abnormal   Collection Time: 02/16/24  8:57 PM  Result Value Ref Range   Glucose-Capillary 115 (H) 70 - 99 mg/dL  Glucose, capillary     Status: Abnormal   Collection Time: 02/17/24  3:29 AM  Result Value Ref Range   Glucose-Capillary 107 (H) 70 - 99 mg/dL  Magnesium      Status: None   Collection Time: 02/17/24  5:18 AM  Result Value Ref Range   Magnesium  1.7 1.7 - 2.4 mg/dL  Phosphorus  Status: None   Collection Time: 02/17/24  5:18 AM  Result Value Ref Range   Phosphorus 3.9 2.5 - 4.6 mg/dL  Comprehensive metabolic panel     Status: Abnormal   Collection Time: 02/17/24  5:18 AM  Result Value Ref Range   Sodium 138 135 - 145 mmol/L   Potassium 3.0 (L) 3.5 - 5.1 mmol/L   Chloride 105 98 - 111 mmol/L   CO2 26 22 - 32 mmol/L   Glucose, Bld 118 (H) 70 - 99 mg/dL   BUN 19 8 - 23 mg/dL   Creatinine, Ser 6.44 (L) 0.61 - 1.24 mg/dL   Calcium 7.7 (L) 8.9 - 10.3 mg/dL   Total Protein 5.2 (L) 6.5 - 8.1 g/dL   Albumin  1.7 (L) 3.5 - 5.0 g/dL   AST 18 15 - 41 U/L   ALT 142 (H) 0 - 44 U/L   Alkaline Phosphatase 130 (H) 38 - 126 U/L   Total Bilirubin 0.3 0.0 - 1.2 mg/dL   GFR, Estimated >03 >47 mL/min   Anion gap 7 5 - 15  CBC     Status: Abnormal   Collection Time: 02/17/24  5:18 AM  Result Value Ref Range   WBC 7.0 4.0 - 10.5 K/uL   RBC 2.80 (L) 4.22 - 5.81 MIL/uL   Hemoglobin 7.5 (L) 13.0 - 17.0 g/dL   HCT 42.5 (L) 95.6 - 38.7 %   MCV 84.3 80.0 - 100.0 fL   MCH 26.8 26.0 - 34.0 pg   MCHC 31.8 30.0 - 36.0 g/dL   RDW 56.4 33.2 - 95.1 %   Platelets 271 150 - 400 K/uL   nRBC 0.0 0.0 - 0.2 %  Triglycerides     Status: Abnormal   Collection Time: 02/17/24  5:18 AM  Result Value Ref Range   Triglycerides 194 (H)  <150 mg/dL  Glucose, capillary     Status: Abnormal   Collection Time: 02/17/24  7:37 AM  Result Value Ref Range   Glucose-Capillary 125 (H) 70 - 99 mg/dL    Assessment & Plan: Present on Admission:  Fall    LOS: 24 days   Additional comments:I reviewed the patient's new clinical lab test results. / 63 y/o M S/P fall on stairs   Acute hypoxic ventilator dependent respiratory failure  - continue weaning as able, trach 5/12-proximal XLT. 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 On ADT w/ Zytiga/prednisone  daily at home - will resume once can take PO (cannot be given per tube) Acute urinary retention  - foley, U/O improved some, CRT good   FEN - TNA, ileus persists though having some BMs, abd film now VTE - SCD's, LMWH ID - completed Rocephin  for strep pneumo PNA Dispo - ICU, vent wean, add ketamine ; cont lasix  1 dose, ileus persists, cont TPN Critical Care Total Time*: 35 Minutes  Dorena Gander, MD, MPH, FACS Trauma & General Surgery Use [email or web address] to contact on call provider  02/17/2024  *Care during the described time interval was provided by me. I have reviewed this patient's available data, including medical history, events of note, physical examination and test results as part of my evaluation.

## 2024-02-17 NOTE — TOC Progression Note (Signed)
 Transition of Care [facility]) - Progression Note    Patient Details  Name: Jason Rojas MRN: [id number] Date of Birth: 01/08/1961  Transition of [facility]) CM/SW Contact  Damiano Stamper M, RN Phone Number: 02/17/2024, 4:25 PM  Clinical Narrative:    Patient has been discussed in multidisciplinary Trauma Rounds: Patient may be a good candidate for long-term acute care hospital; will refer to both area LTAC hospitals to evaluate for eligibility.    Expected Discharge Plan: Long Term Acute Care (LTAC) Barriers to Discharge: Continued Medical Work up  Expected Discharge Plan and Services   Discharge Planning Services: CM Consult   Living arrangements for the past 2 months: Single Family Home                                       Social Determinants of Health (SDOH) Interventions SDOH Screenings   Food Insecurity: No Food Insecurity (01/25/2024)  Housing: Low Risk  (01/25/2024)  Transportation Needs: No Transportation Needs (01/27/2024)  Utilities: Not At Risk (01/27/2024)  Tobacco Use: High Risk (01/24/2024)    Readmission Risk Interventions    01/28/2024   12:04 PM  Readmission Risk Prevention Plan  Transportation Screening Complete  PCP or Specialist Appt within 3-5 Days Complete  HRI or Home Care Consult Complete  Social Work Consult for Recovery Care Planning/Counseling Complete  Palliative Care Screening Not Applicable  Medication Review (RN Care Manager) Complete   Calla Catchings, RN, BSN  Trauma/Neuro ICU Case Manager [phone]

## 2024-02-17 NOTE — Progress Notes (Signed)
 Discussed medications not absorbing despite clamping NG afterwards with Dr Hildy Lowers and 3 M Pharmacist . Will continue to give per tube medications as ordered.

## 2024-02-17 NOTE — Progress Notes (Signed)
 PT Cancellation Note  Patient Details Name: Jason Rojas MRN: [id number] DOB: Mar 15, 1961   Cancelled Treatment:    Reason Eval/Treat Not Completed: Patient not medically ready (RN had begun sedation wean but then orders to increase. pt currently sedated and unresponsive on precedex  and propofol )   Lovinia Snare B [name] 02/17/2024, 8:21 AM [name], PT Acute Rehabilitation Services Office: [phone]

## 2024-02-18 LAB — CBC
HCT: 25.3 % — ABNORMAL LOW (ref 39.0–52.0)
Hemoglobin: 7.8 g/dL — ABNORMAL LOW (ref 13.0–17.0)
MCH: 26.4 pg (ref 26.0–34.0)
MCHC: 30.8 g/dL (ref 30.0–36.0)
MCV: 85.5 fL (ref 80.0–100.0)
Platelets: 307 10*3/uL (ref 150–400)
RBC: 2.96 MIL/uL — ABNORMAL LOW (ref 4.22–5.81)
RDW: 14.5 % (ref 11.5–15.5)
WBC: 7 10*3/uL (ref 4.0–10.5)
nRBC: 0 % (ref 0.0–0.2)

## 2024-02-18 LAB — BASIC METABOLIC PANEL WITH GFR
Anion gap: 4 — ABNORMAL LOW (ref 5–15)
BUN: 21 mg/dL (ref 8–23)
CO2: 30 mmol/L (ref 22–32)
Calcium: 8.4 mg/dL — ABNORMAL LOW (ref 8.9–10.3)
Chloride: 105 mmol/L (ref 98–111)
Creatinine, Ser: 0.38 mg/dL — ABNORMAL LOW (ref 0.61–1.24)
GFR, Estimated: >60 mL/min (ref >60)
Glucose, Bld: 122 mg/dL — ABNORMAL HIGH (ref 70–99)
Potassium: 3.8 mmol/L (ref 3.5–5.1)
Sodium: 139 mmol/L (ref 135–145)

## 2024-02-18 LAB — GLUCOSE, CAPILLARY
Glucose-Capillary: 122 mg/dL — ABNORMAL HIGH (ref 70–99)
Glucose-Capillary: 118 mg/dL — ABNORMAL HIGH (ref 70–99)
Glucose-Capillary: 109 mg/dL — ABNORMAL HIGH (ref 70–99)
Glucose-Capillary: 114 mg/dL — ABNORMAL HIGH (ref 70–99)
Glucose-Capillary: 113 mg/dL — ABNORMAL HIGH (ref 70–99)
Glucose-Capillary: 109 mg/dL — ABNORMAL HIGH (ref 70–99)

## 2024-02-18 LAB — MAGNESIUM: Magnesium: 2.1 mg/dL (ref 1.7–2.4)

## 2024-02-18 LAB — TRIGLYCERIDES: Triglycerides: 58 mg/dL (ref <150)

## 2024-02-18 MED ORDER — KETAMINE HCL 10 MG/ML IJ SOLN
1.0000 mg/kg/h | Status: DC
  Administered 2024-02-18 – 2024-02-19 (×3): 1 mg/kg/h via INTRAVENOUS
  Filled 2024-02-18 (×5): qty 100

## 2024-02-18 MED ORDER — TRAVASOL 10 % IV SOLN: INTRAVENOUS | Status: DC

## 2024-02-18 MED ORDER — POTASSIUM CHLORIDE 2 MEQ/ML IV SOLN
INTRAVENOUS | Status: AC
  Filled 2024-02-18: qty 672

## 2024-02-18 MED ORDER — PIVOT 1.5 CAL PO LIQD
1000.0000 mL | ORAL | Status: AC
  Administered 2024-02-18: 1000 mL
  Filled 2024-02-18: qty 1000

## 2024-02-18 NOTE — Progress Notes (Signed)
 Patient ID: BRENDYN MCLAREN, male   DOB: 12/17/1960, 63 y.o.   MRN: [id number] Follow up - Trauma Critical Care   Patient Details:    TROOPER OLANDER is an 63 y.o. male.  Lines/tubes : PICC Triple Lumen 02/03/24 Right Brachial 41 cm 0 cm (Active)  Indication for Insertion or Continuance of Line Prolonged intravenous therapies 02/17/24 2000  Exposed Catheter (cm) 0 cm 02/03/24 1723  Site Assessment Clean, Dry, Intact 02/18/24 0718  Lumen #1 Status Infusing 02/18/24 0718  Lumen #2 Status Infusing 02/18/24 0718  Lumen #3 Status Infusing 02/18/24 0718  Dressing Type Transparent;Securing device 02/18/24 0718  Dressing Status Antimicrobial disc/dressing in place 02/18/24 0718  Line Care Connections checked and tightened 02/17/24 2000  Line Adjustment (NICU/IV Team Only) No 02/03/24 1723  Dressing Intervention New dressing 02/13/24 1200  Dressing Change Due 02/20/24 02/18/24 0718     NG/OG Vented/Dual Lumen 16 Fr. Right nare Marking at nare/corner of mouth 65 cm (Active)  Tube Position (Required) Marking at nare/corner of mouth 02/18/24 0000  Measurement (cm) (Required) 68 cm 02/18/24 0000  Ongoing Placement Verification (Required) (See row information) Yes 02/18/24 0000  Site Assessment Clean, Dry, Intact 02/18/24 0000  Interventions Other (Comment) 02/17/24 2000  Status Low intermittent suction 02/18/24 0000  Amount of suction 80 mmHg 02/18/24 0000  Drainage Appearance Green 02/18/24 0000  Intake (mL) 120 mL 02/18/24 0557  Output (mL) 100 mL 02/18/24 0557     External Urinary Catheter (Active)  Dedicated Suction Verified suction is between 40-80 mmHg 02/18/24 0731  Securement Method None needed 02/18/24 0731  Site Assessment Intact 02/18/24 0731  Intervention No interventions needed at this time 02/18/24 4098  Intervention No interventions needed at this time 02/18/24 0731  Output (mL) 350 mL 02/18/24 0557    Microbiology/Sepsis markers: Results for orders placed or performed during  the hospital encounter of 01/24/24  MRSA Next Gen by PCR, Nasal     Status: None   Collection Time: 01/24/24  6:25 PM   Specimen: Nasal Mucosa; Nasal Swab  Result Value Ref Range Status   MRSA by PCR Next Gen NOT DETECTED NOT DETECTED Final    Comment: (NOTE) The GeneXpert MRSA Assay (FDA approved for NASAL specimens only), is one component of a comprehensive MRSA colonization surveillance program. It is not intended to diagnose MRSA infection nor to guide or monitor treatment for MRSA infections. Test performance is not FDA approved in patients less than 19 years old. Performed at [facility], [address], [place], [place] [address]   Culture, Respiratory w Gram Stain     Status: None   Collection Time: 02/01/24  6:32 AM   Specimen: Tracheal Aspirate; Respiratory  Result Value Ref Range Status   Specimen Description TRACHEAL ASPIRATE  Final   Special Requests NONE  Final   Gram Stain   Final    RARE SQUAMOUS EPITHELIAL CELLS PRESENT WBC PRESENT, PREDOMINANTLY PMN ABUNDANT GRAM POSITIVE COCCI IN PAIRS IN CHAINS IN CLUSTERS Performed at [facility], [address], [place], [place] [address]    Culture MODERATE STREPTOCOCCUS PNEUMONIAE  Final   Report Status 02/03/2024 FINAL  Final   Organism ID, Bacteria STREPTOCOCCUS PNEUMONIAE  Final      Susceptibility   Streptococcus pneumoniae - MIC*    ERYTHROMYCIN >=8 RESISTANT Resistant     LEVOFLOXACIN 0.5 SENSITIVE Sensitive     VANCOMYCIN 0.5 SENSITIVE Sensitive     PENICILLIN (meningitis) 0.25 RESISTANT Resistant  PENO - penicillin 0.25      PENICILLIN (non-meningitis) 0.25 SENSITIVE Sensitive     PENICILLIN (oral) 0.25 INTERMEDIATE Intermediate     CEFTRIAXONE  (non-meningitis) 0.25 SENSITIVE Sensitive     CEFTRIAXONE  (meningitis) 0.25 SENSITIVE Sensitive     * MODERATE STREPTOCOCCUS PNEUMONIAE    Anti-infectives:  Anti-infectives (From admission, onward)    Start     Dose/Rate Route Frequency Ordered  Stop   02/03/24 1000  cefTRIAXone  (ROCEPHIN ) 2 g in sodium chloride  0.9 % 100 mL IVPB        2 g 200 mL/hr over 30 Minutes Intravenous Every 24 hours 02/03/24 0818 02/08/24 1059   02/02/24 0930  ceFEPIme  (MAXIPIME ) 2 g in sodium chloride  0.9 % 100 mL IVPB  Status:  Discontinued        2 g 200 mL/hr over 30 Minutes Intravenous Every 8 hours 02/02/24 0835 02/03/24 0818   01/24/24 0900  ceFAZolin  (ANCEF ) IVPB 2g/100 mL premix        2 g 200 mL/hr over 30 Minutes Intravenous STAT 01/24/24 0854 01/24/24 0941      Consults: Treatment Team:  Md, Trauma, MD    Studies:    Events:  Subjective:    Overnight Issues: multiple BMs  Objective:  Vital signs for last 24 hours: Temp:  [97.6 F (36.4 C)-99.7 F (37.6 C)] 97.6 F (36.4 C) (05/20 0700) Pulse Rate:  [51-83] 69 (05/20 0800) Resp:  [14-32] 32 (05/20 0800) BP: (94-162)/(55-86) 135/66 (05/20 0804) SpO2:  [92 %-100 %] 100 % (05/20 0800) FiO2 (%):  [40 %] 40 % (05/20 0804) Weight:  [103.3 kg] 103.3 kg (05/20 0500)  Hemodynamic parameters for last 24 hours:    Intake/Output from previous day: 05/19 0701 - 05/20 0700 In: 3864.3 [I.V.:3192.4; NG/GT:320; IV Piggyback:351.9] Out: 3000 [Urine:2300; Emesis/NG output:700]  Intake/Output this shift: Total I/O In: 148.4 [I.V.:148.4] Out: -   Vent settings for last 24 hours: Vent Mode: PRVC FiO2 (%):  [40 %] 40 % Set Rate:  [18 bmp] 18 bmp Vt Set:  [510 mL] 510 mL PEEP:  [5 cmH20] 5 cmH20 Plateau Pressure:  [22 cmH20-24 cmH20] 23 cmH20  Physical Exam:  General: on vent wean Neuro: F/C HEENT/Neck: trach-clean, intact Resp: clear to auscultation bilaterally CVS: RRR 60s GI: distended but softer, NT Extremities: edema 1+  Results for orders placed or performed during the hospital encounter of 01/24/24 (from the past 24 hours)  Glucose, capillary     Status: Abnormal   Collection Time: 02/17/24 11:24 AM  Result Value Ref Range   Glucose-Capillary 116 (H) 70 - 99 mg/dL   Glucose, capillary     Status: None   Collection Time: 02/17/24  3:49 PM  Result Value Ref Range   Glucose-Capillary 91 70 - 99 mg/dL  Glucose, capillary     Status: Abnormal   Collection Time: 02/17/24  7:57 PM  Result Value Ref Range   Glucose-Capillary 134 (H) 70 - 99 mg/dL  Glucose, capillary     Status: Abnormal   Collection Time: 02/17/24 11:36 PM  Result Value Ref Range   Glucose-Capillary 118 (H) 70 - 99 mg/dL  Triglycerides     Status: None   Collection Time: 02/18/24  4:21 AM  Result Value Ref Range   Triglycerides 58 <150 mg/dL  Basic metabolic panel     Status: Abnormal   Collection Time: 02/18/24  4:21 AM  Result Value Ref Range   Sodium 139 135 - 145 mmol/L   Potassium 3.8  3.5 - 5.1 mmol/L   Chloride 105 98 - 111 mmol/L   CO2 30 22 - 32 mmol/L   Glucose, Bld 122 (H) 70 - 99 mg/dL   BUN 21 8 - 23 mg/dL   Creatinine, Ser 1.61 (L) 0.61 - 1.24 mg/dL   Calcium 8.4 (L) 8.9 - 10.3 mg/dL   GFR, Estimated >09 >60 mL/min   Anion gap 4 (L) 5 - 15  Magnesium      Status: None   Collection Time: 02/18/24  4:21 AM  Result Value Ref Range   Magnesium  2.1 1.7 - 2.4 mg/dL  CBC     Status: Abnormal   Collection Time: 02/18/24  4:21 AM  Result Value Ref Range   WBC 7.0 4.0 - 10.5 K/uL   RBC 2.96 (L) 4.22 - 5.81 MIL/uL   Hemoglobin 7.8 (L) 13.0 - 17.0 g/dL   HCT 45.4 (L) 09.8 - 11.9 %   MCV 85.5 80.0 - 100.0 fL   MCH 26.4 26.0 - 34.0 pg   MCHC 30.8 30.0 - 36.0 g/dL   RDW 14.7 82.9 - 56.2 %   Platelets 307 150 - 400 K/uL   nRBC 0.0 0.0 - 0.2 %  Glucose, capillary     Status: Abnormal   Collection Time: 02/18/24  4:34 AM  Result Value Ref Range   Glucose-Capillary 122 (H) 70 - 99 mg/dL  Glucose, capillary     Status: Abnormal   Collection Time: 02/18/24  7:23 AM  Result Value Ref Range   Glucose-Capillary 118 (H) 70 - 99 mg/dL    Assessment & Plan: Present on Admission:  Fall    LOS: 25 days   Additional comments:I reviewed the patient's new clinical lab test  results. / 63 y/o M S/P fall on stairs   Acute hypoxic ventilator dependent respiratory failure  - continue weaning as able, trach 5/12-proximal XLT. 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 On ADT w/ Zytiga/prednisone  daily at home - will resume once can take PO (cannot be given per tube) Acute urinary retention  - foley, U/O improved some, CRT good   FEN - TNA, ileus improving, TF to 20/h today. Ketamine  helping wean drips. VTE - SCD's, LMWH ID - completed Rocephin  for strep pneumo PNA Dispo - ICU, vent wean, ketamine ; looking at [facility] placement Critical Care Total Time*: 34 Minutes  Dorena Gander, MD, MPH, FACS Trauma & General Surgery Use [email or web address] to contact on call provider  02/18/2024  *Care during the described time interval was provided by me. I have reviewed this patient's available data, including medical history, events of note, physical examination and test results as part of my evaluation.

## 2024-02-18 NOTE — Progress Notes (Signed)
 Patient continuously coughing and gagging with increased secretions orally . Propofol  restarted for ventilator synchrony. Patient also continues to have increased watery stools . Rectal pouch applied to help protect skin.

## 2024-02-18 NOTE — TOC Progression Note (Signed)
 Transition of Care [facility]) - Progression Note    Patient Details  Name: Jason Rojas MRN: [id number] Date of Birth: 05-25-1961  Transition of Care [facility]) CM/SW Contact  Ira Dougher M, RN Phone Number: 02/18/2024, 10:07 AM  Clinical Narrative:    Left message for patient's daughter requesting call back to discuss possible LTAC transition.    Expected Discharge Plan: Long Term Acute Care (LTAC) Barriers to Discharge: Continued Medical Work up  Expected Discharge Plan and Services   Discharge Planning Services: CM Consult   Living arrangements for the past 2 months: Single Family Home                                       Social Determinants of Health (SDOH) Interventions SDOH Screenings   Food Insecurity: No Food Insecurity (01/25/2024)  Housing: Low Risk  (01/25/2024)  Transportation Needs: No Transportation Needs (01/27/2024)  Utilities: Not At Risk (01/27/2024)  Tobacco Use: High Risk (01/24/2024)    Readmission Risk Interventions    01/28/2024   12:04 PM  Readmission Risk Prevention Plan  Transportation Screening Complete  PCP or Specialist Appt within 3-5 Days Complete  HRI or Home Care Consult Complete  Social Work Consult for Recovery Care Planning/Counseling Complete  Palliative Care Screening Not Applicable  Medication Review Oceanographer) Complete

## 2024-02-18 NOTE — Progress Notes (Addendum)
 PHARMACY - TOTAL PARENTERAL NUTRITION CONSULT NOTE   Indication: Prolonged ileus  Patient Measurements: Height: 5\' 6"  (167.6 cm) Weight: 103.3 kg (227 lb 11.8 oz) IBW/kg (Calculated) : 63.8 TPN AdjBW (KG): 72.6 Body mass index is 36.76 kg/m. Usual Weight: 85-92 kg   Assessment: Patient with history of heavy alcohol  use presented with fall on stairs, acute respiratory failure requiring mechanical ventilation, L rib fractures, L hemopneumothorax, L pulmonary contusion. Patient also developed Ileus, NGT to suction. Minimal nutrition since admission 4/25. Pharmacy consulted to dose TPN.   Glucose / Insulin : CBGs < 180 controlled, no history of diabetes, 6 units of insulin /24 hours  Electrolytes: K 3.8 and Mg 2.1 after replacement yesterday. Has been receiving Lasix  (last dose 5/19)  Renal: Scr < 1.0; BUN 21 Hepatic: TG 58 on 5/20, LFTs trending back down -Alk phos 130, AST 18, ALT 142, Tbili wnl Intake / Output; MIVF: LBM 5/19 - Reglan  10 q8h; NGT output 700 mL (down); UOP 0.9 mL/kg/h (2.3 L) Net IO Since Admission: 18,408.57 mL [02/18/24 0717]  GI Imaging:  5/13 CT c/ab/p distended stomach, small bowel, and colon filled with fluid and air  5/4 KUB Findings suggest ileus  4/25 CT Chest/Ab/Pelv Stomach/Bowel: Unremarkable GI Surgeries / Procedures: n/a   Central access: PICC Line ordered 5/5  TPN start date: 5/5   Nutritional Goals: Goal TPN rate is 80 mL/hr (provides 105 g of protein and 1977 kcals per day)  RD Assessment: Estimated Needs Total Energy Estimated Needs: 1900-2100 Total Protein Estimated Needs: 105-120g Total Fluid Estimated Needs: >/=1.9L  Current Nutrition:  TPN Tube feeds resumed, at 20 mL/hr today. Attempt to advance to goal by tomorrow per MD.   Propofol  continues at 9.12 ml/hr ~240 kcal/day (~12% of kcal needs)  Plan:  Changed to Cyclic TPN due to mild bump in LFTs  Reduce cycled TPN over 18 hours to half TPN today per MD: cycle at rate 46mL/hr x 1 hour  >> 56 mL/hr x 16 hours >> 28 mL/hr x 1 hour  Removed lipids 5/17 due to Propofol  infusion, will resume lipids in TPN once off propofol  or if consistently at low dose  TPN + Propofol  + TF provides to meet goal needs Electrolytes in TPN: Na 50 mEq/L, K 58 mEq/L, Ca 85mEq/L, Mg 12 mEq/L, and Phos 5 mmol/L. Cl:Ac 2:1 Monitor TPN labs on Mon/Thurs Multivitamin per tube  Fluids per Surgery  Follow-up TF advancement  SSI timed 2 hrs past cyclic TPN start + 1 hr past cycle TPN d/c'ed + middle of TPN infusion + while off TPN (total 4 CBGs/24 hr period)  Patience Bonito, PharmD, BCPS, BCCCP Clinical Pharmacist

## 2024-02-19 LAB — BASIC METABOLIC PANEL WITH GFR
Anion gap: 9 (ref 5–15)
BUN: 21 mg/dL (ref 8–23)
CO2: 26 mmol/L (ref 22–32)
Calcium: 8.7 mg/dL — ABNORMAL LOW (ref 8.9–10.3)
Chloride: 105 mmol/L (ref 98–111)
Creatinine, Ser: 0.42 mg/dL — ABNORMAL LOW (ref 0.61–1.24)
GFR, Estimated: >60 mL/min (ref >60)
Glucose, Bld: 125 mg/dL — ABNORMAL HIGH (ref 70–99)
Potassium: 4 mmol/L (ref 3.5–5.1)
Sodium: 140 mmol/L (ref 135–145)

## 2024-02-19 LAB — CBC
HCT: 24.2 % — ABNORMAL LOW (ref 39.0–52.0)
Hemoglobin: 7.5 g/dL — ABNORMAL LOW (ref 13.0–17.0)
MCH: 26.6 pg (ref 26.0–34.0)
MCHC: 31 g/dL (ref 30.0–36.0)
MCV: 85.8 fL (ref 80.0–100.0)
Platelets: 306 10*3/uL (ref 150–400)
RBC: 2.82 MIL/uL — ABNORMAL LOW (ref 4.22–5.81)
RDW: 14.6 % (ref 11.5–15.5)
WBC: 6.7 10*3/uL (ref 4.0–10.5)
nRBC: 0 % (ref 0.0–0.2)

## 2024-02-19 LAB — GLUCOSE, CAPILLARY
Glucose-Capillary: 128 mg/dL — ABNORMAL HIGH (ref 70–99)
Glucose-Capillary: 123 mg/dL — ABNORMAL HIGH (ref 70–99)
Glucose-Capillary: 115 mg/dL — ABNORMAL HIGH (ref 70–99)
Glucose-Capillary: 85 mg/dL (ref 70–99)
Glucose-Capillary: 93 mg/dL (ref 70–99)
Glucose-Capillary: 98 mg/dL (ref 70–99)

## 2024-02-19 MED ORDER — VALPROATE SODIUM 100 MG/ML IV SOLN
750.0000 mg | Freq: Three times a day (TID) | INTRAVENOUS | Status: DC
  Administered 2024-02-19 – 2024-02-21 (×6): 750 mg via INTRAVENOUS
  Filled 2024-02-19 (×12): qty 7.5

## 2024-02-19 MED ORDER — VITAL 1.5 CAL PO LIQD
1000.0000 mL | ORAL | Status: DC
  Administered 2024-02-19: 1000 mL

## 2024-02-19 MED ORDER — TRAVASOL 10 % IV SOLN
INTRAVENOUS | Status: AC
  Filled 2024-02-19: qty 672

## 2024-02-19 MED ORDER — OXYCODONE HCL 5 MG PO TABS
10.0000 mg | ORAL_TABLET | Freq: Once | ORAL | Status: AC
  Administered 2024-02-19: 10 mg
  Filled 2024-02-19: qty 2

## 2024-02-19 NOTE — Progress Notes (Signed)
   02/19/24 1541  Oxygen Therapy/Pulse Ox  O2 Device (S)  Tracheostomy Collar  O2 Therapy Oxygen humidified  O2 Flow Rate (L/min) 12 L/min  FiO2 (%) (S)  100 %   Pt having issues with PIP. Pt was placed on ATC 100% but did not tolerate due to increase RR and low volumes, Pt desated to mid 80s. Pt placed on full support and left on PC 20/5 40% per Trauma MD and is stable at this time.

## 2024-02-19 NOTE — Progress Notes (Signed)
 Nutrition Follow-up  DOCUMENTATION CODES:   Not applicable  INTERVENTION:  Continue cyclic TPN until consistent and adequate enteral nutrition able to be established Transition TF to Vital 1.5 at 27ml/hr  As medically able, recommend increasing by 10ml q12h to goal rate of 55ml/hr ( per day) Add 60ml ProSource TF20 once daily Provides 2060 kcal, 109g protein, per day   Consider exchanging NGT for cortrak  NUTRITION DIAGNOSIS:  Inadequate oral intake related to acute illness, altered GI function as evidenced by NPO status. - remains applicable  GOAL:  Patient will meet greater than or equal to 90% of their needs - goal met via TPN  MONITOR:  Labs, Weight trends, Vent status  REASON FOR ASSESSMENT:  Consult, Ventilator New TPN/TNA  ASSESSMENT:   Pt admitted after a fall on stairs leading to L rib fractures 3-10, hemopneumothorax and pulmonary contusion. PMH significant for COPD, HTN, prostate cancer and ETOH use.  5/2: re-intubated d/t hypoxia and more obtunded 5/4: abdominal xray- findings suggest ileus 5/12: s/p trach placement; Cortrak placed 5/13: emesis; CT abdomen/pelvis- stomach distended with fluid and air; no obstruction 5/15: trickle feeds initiated via NGT, TPN adjusted to cyclic  5/16: d/c trickle feeds, NGT back to suction; TPN change to cyclic 5/19: abd xray- mildly improved ileus  5/20: trickle TF resumed  Remains intubated on vent support via trach.   SLP signed off d/t continuing to require vent support. Not appropriate for PO trials or PMV 2/2 mentation.   Ileus improving.  Had multiple BM's yesterday.  Trickles restarted yesterday. Tolerating well. Abdomen distended though slightly less firm than prior assessments.   5/20: Reduce cycled TPN over 18 hours to half TPN per MD: cycle at rate 39mL/hr x 1 hour >> 56 mL/hr x 16 hours >> 28 mL/hr x 1 hour   First measured weight (05/05): 87.6 kg Current weight: 103.8 kg + edema: moderate  pitting generalized, BUE, BLE  Drains/lines: NGT (side port in mid-stomach) Triple lumen PICC line Rectal tube UOP: x24 hours  Medications: colace BID, folvite , SSI 0-15 units q4h, reglan , MVI, miralax  daily  Labs:  Cr 0.42 CBG's 109-128 x24 hours ALT/AST last checked 5/19. ALT elevated to 142.  Diet Order:   Diet Order             Diet NPO time specified  Diet effective now                   EDUCATION NEEDS:   No education needs have been identified at this time  Skin:  Skin Assessment: Reviewed RN Assessment (closed neck incision (trach))  Last BM:  5/15 - type 7  Height:   Ht Readings from Last 1 Encounters:  02/08/24 5\' 6"  (1.676 m)    Weight:   Wt Readings from Last 1 Encounters:  02/19/24 103.8 kg    Ideal Body Weight:  64.5 kg  BMI:  Body mass index is 36.94 kg/m.  Estimated Nutritional Needs:   Kcal:  1900-2100  Protein:  95-110g  Fluid:  >/=1.9L  Rocklin Chute, RDN, LDN Clinical Nutrition See AMiON for contact information.

## 2024-02-19 NOTE — Progress Notes (Signed)
  OT Cancellation Note  Patient Details Name: DONNELL WION MRN: [id number] DOB: 1960-12-13   Cancelled Treatment:    Reason Eval/Treat Not Completed: Fatigue/lethargy limiting ability to participate (sedated, not following commands at this time)  Tameem Pullara K, OTD, OTR/L SecureChat [facility] Acute Rehab [phone]   Antionette Kirks 02/19/2024, 11:57 AM

## 2024-02-19 NOTE — Progress Notes (Signed)
 PHARMACY - TOTAL PARENTERAL NUTRITION CONSULT NOTE   Indication: Prolonged ileus  Patient Measurements: Height: 5\' 6"  (167.6 cm) Weight: 103.8 kg (228 lb 13.4 oz) IBW/kg (Calculated) : 63.8 TPN AdjBW (KG): 72.6 Body mass index is 36.94 kg/m. Usual Weight: 85-92 kg   Assessment: Patient with history of heavy alcohol  use presented with fall on stairs, acute respiratory failure requiring mechanical ventilation, L rib fractures, L hemopneumothorax, L pulmonary contusion. Patient also developed Ileus, NGT to suction. Minimal nutrition since admission 4/25. Pharmacy consulted to dose TPN.   Glucose / Insulin : CBGs < 180 controlled, no history of diabetes, 2 units of insulin /24 hours  Electrolytes: Lytes WNL   Renal: Scr < 1.0; BUN 21 Hepatic: TG 58 on 5/20, LFTs trending back down -Alk phos 130, AST 18, ALT 142, Tbili wnl (repeat tomorrow)  Intake / Output; MIVF: LBM 5/20 - Reglan  10 q8h; NGT output 200 mL (down); UOP 0.4 mL/kg/h (1.1 L) Net IO Since Admission: 20,555.33 mL [02/19/24 0938]  GI Imaging:  5/13 CT c/ab/p distended stomach, small bowel, and colon filled with fluid and air  5/4 KUB Findings suggest ileus  4/25 CT Chest/Ab/Pelv Stomach/Bowel: Unremarkable GI Surgeries / Procedures: n/a   Central access: PICC Line ordered 5/5  TPN start date: 5/5   Nutritional Goals: Goal TPN rate is 80 mL/hr (provides 105 g of protein and 1977 kcals per day)  RD Assessment: Estimated Needs Total Energy Estimated Needs: 1900-2100 Total Protein Estimated Needs: 105-120g Total Fluid Estimated Needs: >/=1.9L  Current Nutrition:  TPN Tube feeds resumed, at 20 mL/hr today. Attempt to advance to goal by tomorrow per MD.   Propofol  continues at 21.3 ml/hr ~562 kcal/day (~28% of kcal needs)  Plan:  Changed to Cyclic TPN due to mild bump in LFTs  Reduce cycled TPN over 18 hours to half TPN today per MD: cycle at rate 15mL/hr x 1 hour >> 56 mL/hr x 16 hours >> 28 mL/hr x 1 hour  Removed  lipids 5/17 due to Propofol  infusion, will resume lipids in TPN once off propofol   TPN (824 kcal) + Propofol  (562 kcal) + TF (720 kcal) to meet 100% goal needs Electrolytes in TPN: Na 50 mEq/L, K 58 mEq/L, Ca 35mEq/L, Mg 12 mEq/L, and Phos 5 mmol/L. Cl:Ac 1:1 Monitor TPN labs on Mon/Thurs Multivitamin per tube  Follow-up TF advancement  SSI timed 2 hrs past cyclic TPN start + 1 hr past cycle TPN d/c'ed + middle of TPN infusion + while off TPN (total 4 CBGs/24 hr period)  Jason Rojas, PharmD, BCPS, BCCCP Clinical Pharmacist

## 2024-02-19 NOTE — Progress Notes (Signed)
 SLP Cancellation Note  Patient Details Name: ZOHAIB HEENEY MRN: [id number] DOB: 06-Feb-1961   Cancelled treatment:        Attempted to see pt for PMSV and swallowing evaluations.  Pt continues to require ventilator support and is not appropriate for PO trials.  Spoke with RN.  Pt unable to wean at this time. Pt is not a good candidate for inline pmv 2/2 mentation.  SLP will sign off. Please reconsult when pt is tolerating trach collar and is medically ready for SLP assessment.    Elester Grim, MA, CCC-SLP Acute Rehabilitation Services Office: [phone] 02/19/2024, 8:41 AM

## 2024-02-19 NOTE — TOC Progression Note (Signed)
 Transition of Care [facility]) - Progression Note    Patient Details  Name: Jason Rojas MRN: [id number] Date of Birth: [birth year]  [facility]) CM/SW Contact  Emerald Shor, Avanell Leigh, RN Phone Number: 02/19/2024, 11:30am  Clinical Narrative:    Eldora Greet with [facility], patient's daughter regarding LTAC choice.  Her mother is also on the phone and is a former Engineer, civil (consulting). Discussed LTAC and offered choice; family prefers [facility]. Spoke with Ciera in admissions at facility; she states they would be able to accept pt pending insurance auth and when he is off Ketamine .  Dr. Aniceto Barley has been made aware of this, and plans to stop Ketamine  today. [facility] will initiate insurance auth per protocol; will provide updates as available.    Expected Discharge Plan: Long Term Acute Care (LTAC) Barriers to Discharge: Continued Medical Work up  Expected Discharge Plan and Services   Discharge Planning Services: CM Consult   Living arrangements for the past 2 months: Single Family Home                                       Social Determinants of Health (SDOH) Interventions SDOH Screenings   Food Insecurity: No Food Insecurity (01/25/2024)  Housing: Low Risk  (01/25/2024)  Transportation Needs: No Transportation Needs (01/27/2024)  Utilities: Not At Risk (01/27/2024)  Tobacco Use: High Risk (01/24/2024)    Readmission Risk Interventions    01/28/2024   12:04 PM  Readmission Risk Prevention Plan  Transportation Screening Complete  PCP or Specialist Appt within 3-5 Days Complete  HRI or Home Care Consult Complete  Social Work Consult for Recovery Care Planning/Counseling Complete  Palliative Care Screening Not Applicable  Medication Review (RN Care Manager) Complete   Calla Catchings, RN, BSN  Trauma/Neuro ICU Case Manager [phone]

## 2024-02-19 NOTE — Progress Notes (Signed)
 PT Cancellation Note  Patient Details Name: Jason Rojas MRN: [id number] DOB: 21-Sep-1961   Cancelled Treatment:    Reason Eval/Treat Not Completed: (P) Patient not medically ready (pt with propofol  and precedex  infusing, per RN pt is still too sedated to follow commands this AM.) Will continue efforts per PT plan of care as schedule permits.   Mariel Shope [name] 02/19/2024, 8:58 AM

## 2024-02-20 ENCOUNTER — Inpatient Hospital Stay (HOSPITAL_COMMUNITY)

## 2024-02-20 LAB — CBC
HCT: 24.6 % — ABNORMAL LOW (ref 39.0–52.0)
Hemoglobin: 7.6 g/dL — ABNORMAL LOW (ref 13.0–17.0)
MCH: 26.9 pg (ref 26.0–34.0)
MCHC: 30.9 g/dL (ref 30.0–36.0)
MCV: 86.9 fL (ref 80.0–100.0)
Platelets: 302 10*3/uL (ref 150–400)
RBC: 2.83 MIL/uL — ABNORMAL LOW (ref 4.22–5.81)
RDW: 14.6 % (ref 11.5–15.5)
WBC: 8.7 10*3/uL (ref 4.0–10.5)
nRBC: 0 % (ref 0.0–0.2)

## 2024-02-20 LAB — PHOSPHORUS: Phosphorus: 4.1 mg/dL (ref 2.5–4.6)

## 2024-02-20 LAB — BLOOD CULTURE ID PANEL (REFLEXED) - BCID2

## 2024-02-20 LAB — COMPREHENSIVE METABOLIC PANEL WITH GFR
ALT: 108 U/L — ABNORMAL HIGH (ref 0–44)
AST: 40 U/L (ref 15–41)
Albumin: 2.3 g/dL — ABNORMAL LOW (ref 3.5–5.0)
Alkaline Phosphatase: 156 U/L — ABNORMAL HIGH (ref 38–126)
Anion gap: 10 (ref 5–15)
BUN: 23 mg/dL (ref 8–23)
CO2: 25 mmol/L (ref 22–32)
Calcium: 8.5 mg/dL — ABNORMAL LOW (ref 8.9–10.3)
Chloride: 106 mmol/L (ref 98–111)
Creatinine, Ser: 0.55 mg/dL — ABNORMAL LOW (ref 0.61–1.24)
GFR, Estimated: >60 mL/min (ref >60)
Glucose, Bld: 95 mg/dL (ref 70–99)
Potassium: 4.2 mmol/L (ref 3.5–5.1)
Sodium: 141 mmol/L (ref 135–145)
Total Bilirubin: 0.4 mg/dL (ref 0.0–1.2)
Total Protein: 5.8 g/dL — ABNORMAL LOW (ref 6.5–8.1)

## 2024-02-20 LAB — MAGNESIUM: Magnesium: 2.2 mg/dL (ref 1.7–2.4)

## 2024-02-20 LAB — GLUCOSE, CAPILLARY
Glucose-Capillary: 115 mg/dL — ABNORMAL HIGH (ref 70–99)
Glucose-Capillary: 113 mg/dL — ABNORMAL HIGH (ref 70–99)
Glucose-Capillary: 121 mg/dL — ABNORMAL HIGH (ref 70–99)
Glucose-Capillary: 94 mg/dL (ref 70–99)
Glucose-Capillary: 127 mg/dL — ABNORMAL HIGH (ref 70–99)
Glucose-Capillary: 94 mg/dL (ref 70–99)

## 2024-02-20 MED ORDER — SODIUM CHLORIDE 0.9 % IV SOLN
2.0000 g | Freq: Three times a day (TID) | INTRAVENOUS | Status: DC
  Administered 2024-02-20 – 2024-02-21 (×5): 2 g via INTRAVENOUS
  Filled 2024-02-20 (×5): qty 12.5

## 2024-02-20 MED ORDER — ACETAMINOPHEN 160 MG/5ML PO SOLN
1000.0000 mg | Freq: Four times a day (QID) | ORAL | Status: DC
  Administered 2024-02-20 – 2024-02-21 (×5): 1000 mg
  Filled 2024-02-20 (×5): qty 40.6

## 2024-02-20 MED ORDER — VITAL 1.5 CAL PO LIQD
1000.0000 mL | ORAL | Status: DC
  Administered 2024-02-20: 1000 mL

## 2024-02-20 MED ORDER — PROSOURCE TF20 ENFIT COMPATIBL EN LIQD
60.0000 mL | Freq: Two times a day (BID) | ENTERAL | Status: DC
  Administered 2024-02-20 – 2024-02-21 (×3): 60 mL
  Filled 2024-02-20 (×3): qty 60

## 2024-02-20 NOTE — Progress Notes (Signed)
 Occupational Therapy Treatment Patient Details Name: Jason Rojas MRN: [id number] DOB: 1961-07-10 Today's Date: 02/20/2024   History of present illness 63 y.o. male admitted 01/24/24 s/p x2 falls outside with Lt Rib FX 3-10, Lt hemopneumothorax, and Lt pulmonary contusion. On CIWA protocol. 5/2 hypoxic and obtunded intubated. 5/12 Trach. PMH: COPD, HTN, PTSD, sleep apnea, EtOH abuse, and prostate CA   OT comments  Pt supine in bed, repositioned into chair position.  Pt with limited tolerance for positional change and mobility.  PROM to BUES, pt completing 6 reps hand squeeze bilaterally (asked to complete 5 reps).  Attempted hand over hand grooming (using suction and wiping eyes) but pt requires total assist.  Played music and left on relaxing music for pt.  Will continue to follow, plan remains appropriate.  Intubated PVC PEEP 5, 40% FiO2  Spo2 89-93% RR 29-49        If plan is discharge home, recommend the following:  Two people to help with walking and/or transfers;Two people to help with bathing/dressing/bathroom;Direct supervision/assist for medications management;Direct supervision/assist for financial management;Assist for transportation;Supervision due to cognitive status;Help with stairs or ramp for entrance   Equipment Recommendations  Other (comment) (TBD)    Recommendations for Other Services      Precautions / Restrictions Precautions Precautions: Fall;Other (comment) Recall of Precautions/Restrictions: Impaired Precaution/Restrictions Comments: vent, trach, NG, watch RR Restrictions Weight Bearing Restrictions Per Provider Order: No       Mobility Bed Mobility Overal bed mobility: Needs Assistance             General bed mobility comments: Placed bed in to chair position.    Transfers                         Balance                                           ADL either performed or assessed with clinical judgement   ADL  Overall ADL's : Needs assistance/impaired Eating/Feeding: NPO   Grooming: Wash/dry face;Oral care;Bed level;Total assistance Grooming Details (indicate cue type and reason): hand over hand, suctioning mouth and wiping eyes. total assist                               General ADL Comments: pt with total assist, following simple 1 step commands but overall unable to engage in ADLs    Extremity/Trunk Assessment Upper Extremity Assessment Upper Extremity Assessment: Generalized weakness (BUE edema, does not hold UEs up against gravity. Grasp 3/5)            Vision       Perception     Praxis     Communication Communication Communication: Impaired Factors Affecting Communication: Trach/intubated   Cognition Arousal: Alert Behavior During Therapy: Flat affect Cognition: No family/caregiver present to determine baseline             OT - Cognition Comments: pt with eyes open and grimacing, appears very uncomfortable.  attemtps to reach towards face with L hand.                 Following commands: Impaired Following commands impaired: Follows one step commands with increased time      Cueing   Cueing Techniques: Tactile cues, Verbal cues  Exercises Exercises:  Other exercises Other Exercises Other Exercises: PROM of BUES from shoulder (to 90* flexion) to hands x 5 reps in all planes Other Exercises: AROM, grasp x 5 reps each hand (pt completing 6 reps when asked to do 5x)    Shoulder Instructions       General Comments intubated PVC, PEEP 5, FIO2 40%    Pertinent Vitals/ Pain       Pain Assessment Pain Assessment: CPOT Facial Expression: Grimacing Body Movements: Absence of movements Muscle Tension: Relaxed Compliance with ventilator (intubated pts.): Tolerating ventilator or movement Vocalization (extubated pts.): N/A CPOT Total: 2  Home Living                                          Prior Functioning/Environment               Frequency  Min 2X/week        Progress Toward Goals  OT Goals(current goals can now be found in the care plan section)  Progress towards OT goals: Not progressing toward goals - comment (re-assess next session if not progressing)  Acute Rehab OT Goals Patient Stated Goal: unable OT Goal Formulation: Patient unable to participate in goal setting Time For Goal Achievement: 02/26/24 Potential to Achieve Goals: Fair  Plan      Co-evaluation    PT/OT/SLP Co-Evaluation/Treatment: Yes Reason for Co-Treatment: For patient/therapist safety;Necessary to address cognition/behavior during functional activity PT goals addressed during session: Strengthening/ROM OT goals addressed during session: ADL's and self-care;Strengthening/ROM      AM-PAC OT "6 Clicks" Daily Activity     Outcome Measure   Help from another person eating meals?: Total Help from another person taking care of personal grooming?: Total Help from another person toileting, which includes using toliet, bedpan, or urinal?: Total Help from another person bathing (including washing, rinsing, drying)?: Total Help from another person to put on and taking off regular upper body clothing?: Total Help from another person to put on and taking off regular lower body clothing?: Total 6 Click Score: 6    End of Session Equipment Utilized During Treatment: Oxygen  OT Visit Diagnosis: Unsteadiness on feet (R26.81);Other abnormalities of gait and mobility (R26.89);Repeated falls (R29.6);Muscle weakness (generalized) (M62.81);Other symptoms and signs involving cognitive function   Activity Tolerance Patient limited by fatigue   Patient Left in bed;with call bell/phone within reach;with bed alarm set;with restraints reapplied   Nurse Communication Mobility status;Precautions        Time: 1610-9604 OT Time Calculation (min): 23 min  Charges: OT General Charges $OT Visit: 1 Visit OT Treatments $Self Care/Home  Management : 8-22 mins  Jason Rojas, OT Acute Rehabilitation Services Office [phone] Secure Chat Preferred    Jason Rojas 02/20/2024, 1:59 PM

## 2024-02-20 NOTE — Progress Notes (Signed)
 PHARMACY - PHYSICIAN COMMUNICATION CRITICAL VALUE ALERT - BLOOD CULTURE IDENTIFICATION (BCID)  Jason Rojas is an 63 y.o. male who presented to [facility] on 01/24/2024 with a chief complaint of fall outside   Assessment:  2/4 staph epi with mec/a --> bottles collected same site 1 minute apart, likely contaminant. WBC stable, spoke with overnight covering provider and he is ok to wait and let day team add antibiotics if warranted.   Name of physician (or Provider) Contacted: Wilson  Current antibiotics: cefepime   Changes to prescribed antibiotics recommended:  Patient is on recommended antibiotics - No changes needed  Results for orders placed or performed during the hospital encounter of 01/24/24  Blood Culture ID Panel (Reflexed) (Collected: 02/20/2024  1:15 AM)  Result Value Ref Range   Enterococcus faecalis NOT DETECTED NOT DETECTED   Enterococcus Faecium NOT DETECTED NOT DETECTED   Listeria monocytogenes NOT DETECTED NOT DETECTED   Staphylococcus species DETECTED (A) NOT DETECTED   Staphylococcus aureus (BCID) NOT DETECTED NOT DETECTED   Staphylococcus epidermidis DETECTED (A) NOT DETECTED   Staphylococcus lugdunensis NOT DETECTED NOT DETECTED   Streptococcus species NOT DETECTED NOT DETECTED   Streptococcus agalactiae NOT DETECTED NOT DETECTED   Streptococcus pneumoniae NOT DETECTED NOT DETECTED   Streptococcus pyogenes NOT DETECTED NOT DETECTED   A.calcoaceticus-baumannii NOT DETECTED NOT DETECTED   Bacteroides fragilis NOT DETECTED NOT DETECTED   Enterobacterales NOT DETECTED NOT DETECTED   Enterobacter cloacae complex NOT DETECTED NOT DETECTED   Escherichia coli NOT DETECTED NOT DETECTED   Klebsiella aerogenes NOT DETECTED NOT DETECTED   Klebsiella oxytoca NOT DETECTED NOT DETECTED   Klebsiella pneumoniae NOT DETECTED NOT DETECTED   Proteus species NOT DETECTED NOT DETECTED   Salmonella species NOT DETECTED NOT DETECTED   Serratia marcescens NOT DETECTED NOT DETECTED    Haemophilus influenzae NOT DETECTED NOT DETECTED   Neisseria meningitidis NOT DETECTED NOT DETECTED   Pseudomonas aeruginosa NOT DETECTED NOT DETECTED   Stenotrophomonas maltophilia NOT DETECTED NOT DETECTED   Candida albicans NOT DETECTED NOT DETECTED   Candida auris NOT DETECTED NOT DETECTED   Candida glabrata NOT DETECTED NOT DETECTED   Candida krusei NOT DETECTED NOT DETECTED   Candida parapsilosis NOT DETECTED NOT DETECTED   Candida tropicalis NOT DETECTED NOT DETECTED   Cryptococcus neoformans/gattii NOT DETECTED NOT DETECTED   Methicillin resistance mecA/C DETECTED (A) NOT DETECTED    Angelo Kennedy Kamarah Bilotta 02/20/2024  10:29 PM

## 2024-02-20 NOTE — Progress Notes (Signed)
 Nutrition Follow-up  DOCUMENTATION CODES:   Not applicable  INTERVENTION:   Tube feeding via NG tube: Advance to goal  Vital 1.5 at 50 ml/h (1200 ml per day)  Prosource TF20 60 ml BID  Provides 1960 kcal, 121 gm protein, 914 ml free water daily  Per MD d/c TPN after current bag   NUTRITION DIAGNOSIS:   Inadequate oral intake related to acute illness, altered GI function as evidenced by NPO status. Ongoing.   GOAL:   Patient will meet greater than or equal to 90% of their needs Met with TPN and TF advancing to goal   MONITOR:   TF tolerance, Weight trends  REASON FOR ASSESSMENT:   Consult, Ventilator New TPN/TNA  ASSESSMENT:   Pt admitted after a fall on stairs leading to L rib fractures 3-10, hemopneumothorax and pulmonary contusion. PMH significant for COPD, HTN, prostate cancer and ETOH use.  Pt discussed during ICU rounds and with RN and MD.   Jason Rojas with MD to adv TF to goal and d/c TPN after current bag.   4/25 - admitted after fall 5/2: intubated d/t hypoxia and more obtunded 5/4: abdominal xray- findings suggest ileus 5/5 - TPN initiated for prolonged ileus (10 days after admission)  5/7 - TPN increased to goal providing 1997 kcal and 105 grams protein  5/12: s/p trach placement; Cortrak placed 5/13: emesis; CT abdomen/pelvis- stomach distended with fluid and air; no obstruction 5/15: trickle feeds initiated via NGT, TPN adjusted to cyclic 5/13 - NG placed; tip gastric  Medications reviewed and include: colace, folic acid , SSI every 4 hours, reglan  10 mg every 8 hours, MVI with minerals daily, miralax   Precedex    Labs reviewed:  CBG's: 85-121  Current weight: 105.4 kg Admission weight: 87.6 kg Noted moderate edema   Diet Order:   Diet Order             Diet NPO time specified  Diet effective now                   EDUCATION NEEDS:   No education needs have been identified at this time  Skin:  Skin Assessment: Reviewed RN Assessment  (closed neck incision (trach))  Last BM:  5/22 type 7  Height:   Ht Readings from Last 1 Encounters:  02/08/24 5\' 6"  (1.676 m)    Weight:   Wt Readings from Last 1 Encounters:  02/20/24 105.4 kg    Ideal Body Weight:  64.5 kg  BMI:  Body mass index is 37.5 kg/m.  Estimated Nutritional Needs:   Kcal:  1900-2100  Protein:  95-110g  Fluid:  >/=1.9L  Jason Hoffer P., RD, LDN, CNSC See AMiON for contact information

## 2024-02-20 NOTE — Progress Notes (Signed)
 PHARMACY - TOTAL PARENTERAL NUTRITION CONSULT NOTE   Indication: Prolonged ileus  Patient Measurements: Height: 5\' 6"  (167.6 cm) Weight: 105.4 kg (232 lb 5.8 oz) IBW/kg (Calculated) : 63.8 TPN AdjBW (KG): 72.6 Body mass index is 37.5 kg/m. Usual Weight: 85-92 kg   Assessment: Patient with history of heavy alcohol  use presented with fall on stairs, acute respiratory failure requiring mechanical ventilation, L rib fractures, L hemopneumothorax, L pulmonary contusion. Patient also developed Ileus, NGT to suction. Minimal nutrition since admission 4/25. Pharmacy consulted to dose TPN.   Glucose / Insulin : CBGs <180, no history of diabetes, 2 units of insulin  in 24hrs  Electrolytes: Lytes WNL   Renal: Scr < 1.0; BUN 23 Hepatic: TG 58 on 5/20, LFTs Alk phos 158, AST 40, ALT 108, Tbili wnl (repeat tomorrow)  Intake / Output; MIVF: LBM 5/20 - Reglan  10 q8h; NGT 0 mL charted; UOP 0.5 mL/kg/h  Net IO Since Admission: 21,934.71 mL [02/20/24 0920]  GI Imaging:  5/13 CT c/ab/p distended stomach, small bowel, and colon filled with fluid and air  5/4 KUB Findings suggest ileus  4/25 CT Chest/Ab/Pelv Stomach/Bowel: Unremarkable GI Surgeries / Procedures: n/a   Central access: PICC Line 5/5  TPN start date: 02/03/24  Nutritional Goals: Goal TPN rate is 78 mL/hr (provides 73 g of protein and 1297 kcals per day)  RD Assessment: Estimated Needs Total Energy Estimated Needs: 1900-2100 Total Protein Estimated Needs: 95-110g Total Fluid Estimated Needs: >/=1.9L  Current Nutrition:  TPN Advance TF to goal  Plan:  TF advanced to goal - stop TPN after current bag is complete per Trauma Taper instructions already in (currently cyclic TPN)  [name], PharmD, BCCCP Clinical Pharmacist 02/20/2024 11:09 AM

## 2024-02-20 NOTE — Progress Notes (Signed)
 Patient ID: Jason Rojas, male   DOB: 09-27-1961, 63 y.o.   MRN: [id number] Follow up - Trauma Critical Care   Patient Details:    Jason Rojas is an 63 y.o. male.  Lines/tubes : PICC Triple Lumen 02/03/24 Right Brachial 41 cm 0 cm (Active)  Indication for Insertion or Continuance of Line Administration of hyperosmolar/irritating solutions (i.e. TPN, Vancomycin, etc.) 02/20/24 0100  Exposed Catheter (cm) 0 cm 02/03/24 1723  Site Assessment Clean, Dry, Intact 02/20/24 0100  Lumen #1 Status Infusing 02/20/24 0100  Lumen #2 Status Infusing 02/20/24 0100  Lumen #3 Status Infusing 02/20/24 0100  Dressing Type Transparent;Securing device 02/20/24 0100  Dressing Status Antimicrobial disc/dressing in place;Clean, Dry, Intact 02/20/24 0100  Line Care Connections checked and tightened 02/20/24 0100  Line Adjustment (NICU/IV Team Only) No 02/03/24 1723  Dressing Intervention Dressing changed;Antimicrobial disc changed;Securement device changed 02/18/24 1048  Dressing Change Due 02/25/24 02/20/24 0100     NG/OG Vented/Dual Lumen 16 Fr. Right nare Marking at nare/corner of mouth 65 cm (Active)  Tube Position (Required) Marking at nare/corner of mouth 02/20/24 0400  Measurement (cm) (Required) 68 cm 02/20/24 0400  Ongoing Placement Verification (Required) (See row information) Yes 02/20/24 0400  Site Assessment Clean, Dry, Intact 02/20/24 0400  Interventions Other (Comment) 02/17/24 2000  Status Feeding 02/20/24 0400  Amount of suction 80 mmHg 02/19/24 0800  Drainage Appearance Green 02/19/24 0800  Intake (mL) 60 mL 02/19/24 1600  Output (mL) 200 mL 02/18/24 0900     Flatus Tube/Pouch (Active)  Daily care Skin around tube assessed 02/20/24 0100  Output (mL) 0 mL 02/19/24 0800     External Urinary Catheter (Active)  Dedicated Suction Verified suction is between 40-80 mmHg 02/20/24 0800  Securement Method None needed 02/20/24 0800  Site Assessment Clean, Dry, Intact;Clean;Dry 02/20/24 0800   Intervention No interventions needed at this time 02/20/24 0800  Intervention No interventions needed at this time 02/20/24 0800  Output (mL) 250 mL 02/20/24 0400    Microbiology/Sepsis markers: Results for orders placed or performed during the hospital encounter of 01/24/24  MRSA Next Gen by PCR, Nasal     Status: None   Collection Time: 01/24/24  6:25 PM   Specimen: Nasal Mucosa; Nasal Swab  Result Value Ref Range Status   MRSA by PCR Next Gen NOT DETECTED NOT DETECTED Final    Comment: (NOTE) The GeneXpert MRSA Assay (FDA approved for NASAL specimens only), is one component of a comprehensive MRSA colonization surveillance program. It is not intended to diagnose MRSA infection nor to guide or monitor treatment for MRSA infections. Test performance is not FDA approved in patients less than 52 years old. Performed at [facility], [address] [place], [place] [address]   Culture, Respiratory w Gram Stain     Status: None   Collection Time: 02/01/24  6:32 AM   Specimen: Tracheal Aspirate; Respiratory  Result Value Ref Range Status   Specimen Description TRACHEAL ASPIRATE  Final   Special Requests NONE  Final   Gram Stain   Final    RARE SQUAMOUS EPITHELIAL CELLS PRESENT WBC PRESENT, PREDOMINANTLY PMN ABUNDANT GRAM POSITIVE COCCI IN PAIRS IN CHAINS IN CLUSTERS Performed at [facility], [address] [place], [place] [address]    Culture MODERATE STREPTOCOCCUS PNEUMONIAE  Final   Report Status 02/03/2024 FINAL  Final   Organism ID, Bacteria STREPTOCOCCUS PNEUMONIAE  Final      Susceptibility   Streptococcus pneumoniae - MIC*  ERYTHROMYCIN >=8 RESISTANT Resistant     LEVOFLOXACIN 0.5 SENSITIVE Sensitive     VANCOMYCIN 0.5 SENSITIVE Sensitive     PENICILLIN (meningitis) 0.25 RESISTANT Resistant     PENO - penicillin 0.25      PENICILLIN (non-meningitis) 0.25 SENSITIVE Sensitive     PENICILLIN (oral) 0.25 INTERMEDIATE Intermediate     CEFTRIAXONE   (non-meningitis) 0.25 SENSITIVE Sensitive     CEFTRIAXONE  (meningitis) 0.25 SENSITIVE Sensitive     * MODERATE STREPTOCOCCUS PNEUMONIAE  Culture, blood (Routine X 2) w Reflex to ID Panel     Status: None (Preliminary result)   Collection Time: 02/20/24  1:14 AM   Specimen: BLOOD LEFT HAND  Result Value Ref Range Status   Specimen Description BLOOD LEFT HAND  Final   Special Requests   Final    BOTTLES DRAWN AEROBIC AND ANAEROBIC Blood Culture adequate volume   Culture   Final    NO GROWTH < 12 HOURS Performed at [facility], [address], [place], [place] [address]    Report Status PENDING  Incomplete  Culture, blood (Routine X 2) w Reflex to ID Panel     Status: None (Preliminary result)   Collection Time: 02/20/24  1:15 AM   Specimen: BLOOD LEFT HAND  Result Value Ref Range Status   Specimen Description BLOOD LEFT HAND  Final   Special Requests   Final    BOTTLES DRAWN AEROBIC AND ANAEROBIC Blood Culture adequate volume   Culture   Final    NO GROWTH < 12 HOURS Performed at [facility], [address] [place], [place] [address]    Report Status PENDING  Incomplete  Culture, Respiratory w Gram Stain     Status: None (Preliminary result)   Collection Time: 02/20/24  2:55 AM   Specimen: Tracheal Aspirate; Respiratory  Result Value Ref Range Status   Specimen Description TRACHEAL ASPIRATE  Final   Special Requests NONE  Final   Gram Stain   Final    MODERATE WBC PRESENT,BOTH PMN AND MONONUCLEAR FEW GRAM NEGATIVE RODS Performed at [facility], [address] [place], [place] [address]    Culture PENDING  Incomplete   Report Status PENDING  Incomplete    Anti-infectives:  Anti-infectives (From admission, onward)    Start     Dose/Rate Route Frequency Ordered Stop   02/20/24 0200  ceFEPIme  (MAXIPIME ) 2 g in sodium chloride  0.9 % 100 mL IVPB        2 g 200 mL/hr over 30 Minutes Intravenous Every 8 hours 02/20/24 0044     02/03/24 1000  cefTRIAXone   (ROCEPHIN ) 2 g in sodium chloride  0.9 % 100 mL IVPB        2 g 200 mL/hr over 30 Minutes Intravenous Every 24 hours 02/03/24 0818 02/08/24 1059   02/02/24 0930  ceFEPIme  (MAXIPIME ) 2 g in sodium chloride  0.9 % 100 mL IVPB  Status:  Discontinued        2 g 200 mL/hr over 30 Minutes Intravenous Every 8 hours 02/02/24 0835 02/03/24 0818   01/24/24 0900  ceFAZolin  (ANCEF ) IVPB 2g/100 mL premix        2 g 200 mL/hr over 30 Minutes Intravenous STAT 01/24/24 0854 01/24/24 0941     Consults: Treatment Team:  Md, Trauma, MD    Studies:    Events:  Subjective:    Overnight Issues:   Objective:  Vital signs for last 24 hours: Temp:  [99.1 F (37.3 C)-101.7  F (38.7 C)] 99.1 F (37.3 C) (05/22 1553) Pulse Rate:  [63-126] 69 (05/22 1500) Resp:  [18-41] 21 (05/22 1500) BP: (88-189)/(54-132) 99/58 (05/22 1500) SpO2:  [90 %-98 %] 96 % (05/22 1500) FiO2 (%):  [40 %-50 %] 40 % (05/22 1414) Weight:  [105.4 kg] 105.4 kg (05/22 0500)  Hemodynamic parameters for last 24 hours:    Intake/Output from previous day: 05/21 0701 - 05/22 0700 In: 2729.4 [I.V.:1845.3; NG/GT:611.3; IV Piggyback:272.7] Out: 1350 [Urine:1350]  Intake/Output this shift: Total I/O In: 1001.1 [I.V.:672.4; NG/GT:217.3; IV Piggyback:111.4] Out: 600 [Urine:400; Stool:200]  Vent settings for last 24 hours: Vent Mode: PCV FiO2 (%):  [40 %-50 %] 40 % Set Rate:  [15 bmp] 15 bmp PEEP:  [5 cmH20] 5 cmH20 Pressure Support:  [10 cmH20] 10 cmH20 Plateau Pressure:  [21 cmH20-32 cmH20] 23 cmH20  Physical Exam:  General: awake on vent Neuro: f/c HEENT/Neck: trach-clean, intact Resp: some rhonchi CVS: RRR GI: distended and soft, NT Extremities: no sig edema  Results for orders placed or performed during the hospital encounter of 01/24/24 (from the past 24 hours)  Glucose, capillary     Status: None   Collection Time: 02/19/24  7:13 PM  Result Value Ref Range   Glucose-Capillary 93 70 - 99 mg/dL  Glucose,  capillary     Status: None   Collection Time: 02/19/24 11:11 PM  Result Value Ref Range   Glucose-Capillary 98 70 - 99 mg/dL  Magnesium      Status: None   Collection Time: 02/20/24  1:14 AM  Result Value Ref Range   Magnesium  2.2 1.7 - 2.4 mg/dL  Phosphorus     Status: None   Collection Time: 02/20/24  1:14 AM  Result Value Ref Range   Phosphorus 4.1 2.5 - 4.6 mg/dL  Comprehensive metabolic panel     Status: Abnormal   Collection Time: 02/20/24  1:14 AM  Result Value Ref Range   Sodium 141 135 - 145 mmol/L   Potassium 4.2 3.5 - 5.1 mmol/L   Chloride 106 98 - 111 mmol/L   CO2 25 22 - 32 mmol/L   Glucose, Bld 95 70 - 99 mg/dL   BUN 23 8 - 23 mg/dL   Creatinine, Ser 2.95 (L) 0.61 - 1.24 mg/dL   Calcium 8.5 (L) 8.9 - 10.3 mg/dL   Total Protein 5.8 (L) 6.5 - 8.1 g/dL   Albumin  2.3 (L) 3.5 - 5.0 g/dL   AST 40 15 - 41 U/L   ALT 108 (H) 0 - 44 U/L   Alkaline Phosphatase 156 (H) 38 - 126 U/L   Total Bilirubin 0.4 0.0 - 1.2 mg/dL   GFR, Estimated >62 >13 mL/min   Anion gap 10 5 - 15  CBC     Status: Abnormal   Collection Time: 02/20/24  1:14 AM  Result Value Ref Range   WBC 8.7 4.0 - 10.5 K/uL   RBC 2.83 (L) 4.22 - 5.81 MIL/uL   Hemoglobin 7.6 (L) 13.0 - 17.0 g/dL   HCT 08.6 (L) 57.8 - 46.9 %   MCV 86.9 80.0 - 100.0 fL   MCH 26.9 26.0 - 34.0 pg   MCHC 30.9 30.0 - 36.0 g/dL   RDW 62.9 52.8 - 41.3 %   Platelets 302 150 - 400 K/uL   nRBC 0.0 0.0 - 0.2 %  Culture, blood (Routine X 2) w Reflex to ID Panel     Status: None (Preliminary result)   Collection Time: 02/20/24  1:14 AM  Specimen: BLOOD LEFT HAND  Result Value Ref Range   Specimen Description BLOOD LEFT HAND    Special Requests      BOTTLES DRAWN AEROBIC AND ANAEROBIC Blood Culture adequate volume   Culture      NO GROWTH < 12 HOURS Performed at [facility], [address] [place], [place] [address]    Report Status PENDING   Culture, blood (Routine X 2) w Reflex to ID Panel     Status: None (Preliminary  result)   Collection Time: 02/20/24  1:15 AM   Specimen: BLOOD LEFT HAND  Result Value Ref Range   Specimen Description BLOOD LEFT HAND    Special Requests      BOTTLES DRAWN AEROBIC AND ANAEROBIC Blood Culture adequate volume   Culture      NO GROWTH < 12 HOURS Performed at [facility], [address], [place], [place] [address]    Report Status PENDING   Culture, Respiratory w Gram Stain     Status: None (Preliminary result)   Collection Time: 02/20/24  2:55 AM   Specimen: Tracheal Aspirate; Respiratory  Result Value Ref Range   Specimen Description TRACHEAL ASPIRATE    Special Requests NONE    Gram Stain      MODERATE WBC PRESENT,BOTH PMN AND MONONUCLEAR FEW GRAM NEGATIVE RODS Performed at [facility], [address], [place], [place] [address]    Culture PENDING    Report Status PENDING   Glucose, capillary     Status: Abnormal   Collection Time: 02/20/24  3:25 AM  Result Value Ref Range   Glucose-Capillary 115 (H) 70 - 99 mg/dL  Glucose, capillary     Status: Abnormal   Collection Time: 02/20/24  4:29 AM  Result Value Ref Range   Glucose-Capillary 113 (H) 70 - 99 mg/dL  Glucose, capillary     Status: Abnormal   Collection Time: 02/20/24  7:37 AM  Result Value Ref Range   Glucose-Capillary 121 (H) 70 - 99 mg/dL    Assessment & Plan: Present on Admission:  Fall    LOS: 27 days   Additional comments:I reviewed the patient's new clinical lab test results. / 63 y/o M S/P fall on stairs   Acute hypoxic ventilator dependent respiratory failure  - continue weaning as able, trach 5/12-proximal XLT. 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 On ADT w/ Zytiga/prednisone  daily at home - will resume once  can take PO (cannot be given per tube) Acute urinary retention  - foley, U/O improved some, CRT good   FEN - TNA, ileus improving, TF to stay at 20/h today. Ketamine  and prop off, maximize prns, start VPA VTE - SCD's, LMWH ID - completed Rocephin  for strep pneumo PNA Dispo - ICU, vent/sedation wean, d/c ketamine  and prop; looking at [facility] placement - discussed with TOC. Appreciate their help. Critical Care Total Time*: 45 Minutes  Dorena Gander, MD, MPH, FACS Trauma & General Surgery Use [email or web address] to contact on call provider  02/20/2024  *Care during the described time interval was provided by me. I have reviewed this patient's available data, including medical history, events of note, physical examination and test results as part of my evaluation.

## 2024-02-20 NOTE — Progress Notes (Signed)
 Trauma/Critical Care Follow Up Note  Subjective:    Overnight Issues:   Objective:  Vital signs for last 24 hours: Temp:  [98 F (36.7 C)-101.7 F (38.7 C)] 99.6 F (37.6 C) (05/22 0802) Pulse Rate:  [53-126] 63 (05/22 0724) Resp:  [11-48] 24 (05/22 0724) BP: (88-192)/(54-132) 99/56 (05/22 0802) SpO2:  [90 %-100 %] 98 % (05/22 0700) FiO2 (%):  [40 %-100 %] 40 % (05/22 1014) Weight:  [105.4 kg] 105.4 kg (05/22 0500)  Hemodynamic parameters for last 24 hours:    Intake/Output from previous day: 05/21 0701 - 05/22 0700 In: 2729.4 [I.V.:1845.3; NG/GT:611.3; IV Piggyback:272.7] Out: 1350 [Urine:1350]  Intake/Output this shift: No intake/output data recorded.  Vent settings for last 24 hours: Vent Mode: PCV FiO2 (%):  [40 %-100 %] 40 % Set Rate:  [15 bmp-18 bmp] 15 bmp Vt Set:  [510 mL] 510 mL PEEP:  [5 cmH20] 5 cmH20 Pressure Support:  [10 cmH20] 10 cmH20 Plateau Pressure:  [21 cmH20-32 cmH20] 21 cmH20  Physical Exam:  Gen: comfortable, no distress Neuro: sedated on exam HEENT: PERRL Neck: supple CV: RRR Pulm: unlabored breathing on mechanical ventilation-full support Abd: soft, NT  , no recent BM GU: urine clear and yellow, +spontaneous voids Extr: wwp, no edema  Results for orders placed or performed during the hospital encounter of 01/24/24 (from the past 24 hours)  Glucose, capillary     Status: Abnormal   Collection Time: 02/19/24 11:00 AM  Result Value Ref Range   Glucose-Capillary 115 (H) 70 - 99 mg/dL  Glucose, capillary     Status: None   Collection Time: 02/19/24  4:17 PM  Result Value Ref Range   Glucose-Capillary 85 70 - 99 mg/dL  Glucose, capillary     Status: None   Collection Time: 02/19/24  7:13 PM  Result Value Ref Range   Glucose-Capillary 93 70 - 99 mg/dL  Glucose, capillary     Status: None   Collection Time: 02/19/24 11:11 PM  Result Value Ref Range   Glucose-Capillary 98 70 - 99 mg/dL  Magnesium      Status: None   Collection  Time: 02/20/24  1:14 AM  Result Value Ref Range   Magnesium  2.2 1.7 - 2.4 mg/dL  Phosphorus     Status: None   Collection Time: 02/20/24  1:14 AM  Result Value Ref Range   Phosphorus 4.1 2.5 - 4.6 mg/dL  Comprehensive metabolic panel     Status: Abnormal   Collection Time: 02/20/24  1:14 AM  Result Value Ref Range   Sodium 141 135 - 145 mmol/L   Potassium 4.2 3.5 - 5.1 mmol/L   Chloride 106 98 - 111 mmol/L   CO2 25 22 - 32 mmol/L   Glucose, Bld 95 70 - 99 mg/dL   BUN 23 8 - 23 mg/dL   Creatinine, Ser 0.98 (L) 0.61 - 1.24 mg/dL   Calcium 8.5 (L) 8.9 - 10.3 mg/dL   Total Protein 5.8 (L) 6.5 - 8.1 g/dL   Albumin  2.3 (L) 3.5 - 5.0 g/dL   AST 40 15 - 41 U/L   ALT 108 (H) 0 - 44 U/L   Alkaline Phosphatase 156 (H) 38 - 126 U/L   Total Bilirubin 0.4 0.0 - 1.2 mg/dL   GFR, Estimated >11 >91 mL/min   Anion gap 10 5 - 15  CBC     Status: Abnormal   Collection Time: 02/20/24  1:14 AM  Result Value Ref Range   WBC 8.7 4.0 -  10.5 K/uL   RBC 2.83 (L) 4.22 - 5.81 MIL/uL   Hemoglobin 7.6 (L) 13.0 - 17.0 g/dL   HCT 16.1 (L) 09.6 - 04.5 %   MCV 86.9 80.0 - 100.0 fL   MCH 26.9 26.0 - 34.0 pg   MCHC 30.9 30.0 - 36.0 g/dL   RDW 40.9 81.1 - 91.4 %   Platelets 302 150 - 400 K/uL   nRBC 0.0 0.0 - 0.2 %  Culture, blood (Routine X 2) w Reflex to ID Panel     Status: None (Preliminary result)   Collection Time: 02/20/24  1:14 AM   Specimen: BLOOD LEFT HAND  Result Value Ref Range   Specimen Description BLOOD LEFT HAND    Special Requests      BOTTLES DRAWN AEROBIC AND ANAEROBIC Blood Culture adequate volume   Culture      NO GROWTH < 12 HOURS Performed at [facility], [address], [place], [place] [address]    Report Status PENDING   Culture, blood (Routine X 2) w Reflex to ID Panel     Status: None (Preliminary result)   Collection Time: 02/20/24  1:15 AM   Specimen: BLOOD LEFT HAND  Result Value Ref Range   Specimen Description BLOOD LEFT HAND    Special Requests      BOTTLES  DRAWN AEROBIC AND ANAEROBIC Blood Culture adequate volume   Culture      NO GROWTH < 12 HOURS Performed at [facility], [address], [place], [place] [address]    Report Status PENDING   Culture, Respiratory w Gram Stain     Status: None (Preliminary result)   Collection Time: 02/20/24  2:55 AM   Specimen: Tracheal Aspirate; Respiratory  Result Value Ref Range   Specimen Description TRACHEAL ASPIRATE    Special Requests NONE    Gram Stain      MODERATE WBC PRESENT,BOTH PMN AND MONONUCLEAR FEW GRAM NEGATIVE RODS Performed at [facility], [address] [place], [place] [address]    Culture PENDING    Report Status PENDING   Glucose, capillary     Status: Abnormal   Collection Time: 02/20/24  4:29 AM  Result Value Ref Range   Glucose-Capillary 113 (H) 70 - 99 mg/dL    Assessment & Plan: The plan of care was discussed with the bedside nurse for the day, who is in agreement with this plan and no additional concerns were raised.   Present on Admission:  Fall    LOS: 27 days   Additional comments:I reviewed the patient's new clinical lab test results.   and I reviewed the patients new imaging test results.    63 y/o M S/P fall on stairs   Acute hypoxic ventilator dependent respiratory failure  - continue weaning as able, trach 5/12-proximal XLT. 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 On ADT w/ Zytiga/prednisone  daily at home - will resume once can take PO (cannot be given per tube) Acute urinary retention  - foley, U/O improved some, CRT good   FEN - TNA, ileus improving, TF to stay at 20/h today. Ketamine  and prop off, maximize prns, start VPA VTE - SCD's, LMWH ID - completed Rocephin  for strep  pneumo PNA Dispo - ICU, vent/sedation wean, d/c ketamine  and prop; looking at [facility] placement  Critical Care Total Time: 35 minutes  Anda Bamberg, MD Trauma & General Surgery Please use [email or web address] to contact on call provider  02/20/2024  *Care during the described time interval was provided by me. I have reviewed this patient's available data, including medical history, events of note, physical examination and test results as part of my evaluation.

## 2024-02-20 NOTE — Progress Notes (Addendum)
 Physical Therapy Treatment Patient Details Name: ABDELAZIZ WESTENBERGER MRN: [id number] DOB: [birth year] Today's Date: 02/20/2024   History of Present Illness 63 y.o. male admitted 01/24/24 s/p x2 falls outside with Lt Rib FX 3-10, Lt hemopneumothorax, and Lt pulmonary contusion. On CIWA protocol. 5/2 hypoxic and obtunded intubated. 5/12 Trach. PMH: COPD, HTN, PTSD, sleep apnea, EtOH abuse, and prostate CA    PT Comments  RN reports recently turning down pt Precedex . Pt with limited tolerance for mobility, with increased RR and secretions. Bed placed in chair position and ROM performed to extremities. Played relaxing music. RR 29-49, SpO2 89-93% on 40% FiO2, 5 PEEP. Will continue to follow acutely.     If plan is discharge home, recommend the following: Two people to help with walking and/or transfers;Two people to help with bathing/dressing/bathroom;Assistance with cooking/housework;Assistance with feeding;Assist for transportation;Help with stairs or ramp for entrance   Can travel by private vehicle     No  Equipment Recommendations  Wheelchair (measurements PT);Wheelchair cushion (measurements PT);Hospital bed;Hoyer lift    Recommendations for Other Services       Precautions / Restrictions Precautions Precautions: Fall;Other (comment) Recall of Precautions/Restrictions: Impaired Precaution/Restrictions Comments: vent, trach, NG, watch RR Restrictions Weight Bearing Restrictions Per Provider Order: No     Mobility  Bed Mobility Overal bed mobility: Needs Assistance             General bed mobility comments: Placed bed in to chair position.    Transfers                        Ambulation/Gait                   Stairs             Wheelchair Mobility     Tilt Bed    Modified Rankin (Stroke Patients Only)       Balance                                            Communication Communication Communication: Impaired Factors  Affecting Communication: Trach/intubated  Cognition Arousal: Alert Behavior During Therapy: Flat affect   PT - Cognitive impairments: Difficult to assess Difficult to assess due to: Tracheostomy                     PT - Cognition Comments: Follows commands for squeezing hands, thumbs up Following commands: Impaired Following commands impaired: Follows one step commands with increased time    Cueing Cueing Techniques: Tactile cues, Verbal cues  Exercises General Exercises - Lower Extremity Ankle Circles/Pumps: PROM, Both, 10 reps, Supine Heel Slides: PROM, Both, 5 reps, Supine Hip ABduction/ADduction: PROM, Both, 5 reps, Supine    General Comments        Pertinent Vitals/Pain Pain Assessment Pain Assessment: CPOT Facial Expression: Grimacing Body Movements: Absence of movements Muscle Tension: Relaxed Compliance with ventilator (intubated pts.): Tolerating ventilator or movement Vocalization (extubated pts.): N/A CPOT Total: 2    Home Living                          Prior Function            PT Goals (current goals can now be found in the care plan section) Acute Rehab PT Goals Patient Stated Goal: Pt unable  Potential to Achieve Goals: Fair Progress towards PT goals: Not progressing toward goals - comment    Frequency    Min 1X/week      PT Plan      Co-evaluation PT/OT/SLP Co-Evaluation/Treatment: Yes Reason for Co-Treatment: For patient/therapist safety;Necessary to address cognition/behavior during functional activity PT goals addressed during session: Strengthening/ROM        AM-PAC PT "6 Clicks" Mobility   Outcome Measure  Help needed turning from your back to your side while in a flat bed without using bedrails?: Total Help needed moving from lying on your back to sitting on the side of a flat bed without using bedrails?: Total Help needed moving to and from a bed to a chair (including a wheelchair)?: Total Help needed  standing up from a chair using your arms (e.g., wheelchair or bedside chair)?: Total Help needed to walk in hospital room?: Total Help needed climbing 3-5 steps with a railing? : Total 6 Click Score: 6    End of Session Equipment Utilized During Treatment: Oxygen Activity Tolerance: Other (comment) (limited by high RR) Patient left: in bed;with call bell/phone within reach;with restraints reapplied Nurse Communication: Mobility status;Other (comment) (RR) PT Visit Diagnosis: Other abnormalities of gait and mobility (R26.89);History of falling (Z91.81);Muscle weakness (generalized) (M62.81);Difficulty in walking, not elsewhere classified (R26.2)     Time: 4098-1191 PT Time Calculation (min) (ACUTE ONLY): 23 min  Charges:    $Therapeutic Activity: 8-22 mins PT General Charges $$ ACUTE PT VISIT: 1 Visit                     Verdia Glad, PT, DPT Acute Rehabilitation Services Office [phone]    Claria Crofts 02/20/2024, 1:17 PM

## 2024-02-21 ENCOUNTER — Institutional Professional Consult (permissible substitution) (HOSPITAL_COMMUNITY): Payer: Self-pay

## 2024-02-21 ENCOUNTER — Institutional Professional Consult (permissible substitution)
Admission: RE | Admit: 2024-02-21 | Discharge: 2024-04-29 | Disposition: A | Discharge status: Skilled Nursing Facility | Payer: Self-pay | Source: Other Acute Inpatient Hospital | Attending: Internal Medicine | Admitting: Internal Medicine

## 2024-02-21 LAB — CBC
HCT: 25.3 % — ABNORMAL LOW (ref 39.0–52.0)
HCT: 29 % — ABNORMAL LOW (ref 39.0–52.0)
Hemoglobin: 7.6 g/dL — ABNORMAL LOW (ref 13.0–17.0)
Hemoglobin: 8.9 g/dL — ABNORMAL LOW (ref 13.0–17.0)
MCH: 26.4 pg (ref 26.0–34.0)
MCH: 26.3 pg (ref 26.0–34.0)
MCHC: 30 g/dL (ref 30.0–36.0)
MCHC: 30.7 g/dL (ref 30.0–36.0)
MCV: 87.8 fL (ref 80.0–100.0)
MCV: 85.5 fL (ref 80.0–100.0)
Platelets: 305 10*3/uL (ref 150–400)
Platelets: 371 10*3/uL (ref 150–400)
RBC: 2.88 MIL/uL — ABNORMAL LOW (ref 4.22–5.81)
RBC: 3.39 MIL/uL — ABNORMAL LOW (ref 4.22–5.81)
RDW: 15 % (ref 11.5–15.5)
RDW: 15 % (ref 11.5–15.5)
WBC: 9.1 10*3/uL (ref 4.0–10.5)
WBC: 15 10*3/uL — ABNORMAL HIGH (ref 4.0–10.5)
nRBC: 0 % (ref 0.0–0.2)
nRBC: 0 % (ref 0.0–0.2)

## 2024-02-21 LAB — BASIC METABOLIC PANEL WITH GFR
Anion gap: 9 (ref 5–15)
BUN: 23 mg/dL (ref 8–23)
CO2: 22 mmol/L (ref 22–32)
Calcium: 8.1 mg/dL — ABNORMAL LOW (ref 8.9–10.3)
Chloride: 110 mmol/L (ref 98–111)
Creatinine, Ser: 0.48 mg/dL — ABNORMAL LOW (ref 0.61–1.24)
GFR, Estimated: >60 mL/min (ref >60)
Glucose, Bld: 109 mg/dL — ABNORMAL HIGH (ref 70–99)
Potassium: 3.4 mmol/L — ABNORMAL LOW (ref 3.5–5.1)
Sodium: 141 mmol/L (ref 135–145)

## 2024-02-21 LAB — COMPREHENSIVE METABOLIC PANEL WITH GFR
ALT: 89 U/L — ABNORMAL HIGH (ref 0–44)
AST: 32 U/L (ref 15–41)
Albumin: 2.6 g/dL — ABNORMAL LOW (ref 3.5–5.0)
Alkaline Phosphatase: 149 U/L — ABNORMAL HIGH (ref 38–126)
Anion gap: 9 (ref 5–15)
BUN: 22 mg/dL (ref 8–23)
CO2: 26 mmol/L (ref 22–32)
Calcium: 9.2 mg/dL (ref 8.9–10.3)
Chloride: 109 mmol/L (ref 98–111)
Creatinine, Ser: 0.52 mg/dL — ABNORMAL LOW (ref 0.61–1.24)
GFR, Estimated: >60 mL/min (ref >60)
Glucose, Bld: 110 mg/dL — ABNORMAL HIGH (ref 70–99)
Potassium: 4.2 mmol/L (ref 3.5–5.1)
Sodium: 144 mmol/L (ref 135–145)
Total Bilirubin: 0.4 mg/dL (ref 0.0–1.2)
Total Protein: 7 g/dL (ref 6.5–8.1)

## 2024-02-21 LAB — BLOOD GAS, ARTERIAL
Acid-Base Excess: 2 mmol/L (ref 0.0–2.0)
Bicarbonate: 27.3 mmol/L (ref 20.0–28.0)
O2 Saturation: 99.8 %
Patient temperature: 37.4
pCO2 arterial: 45 mmHg (ref 32–48)
pH, Arterial: 7.39 (ref 7.35–7.45)
pO2, Arterial: 135 mmHg — ABNORMAL HIGH (ref 83–108)

## 2024-02-21 LAB — GLUCOSE, CAPILLARY
Glucose-Capillary: 108 mg/dL — ABNORMAL HIGH (ref 70–99)
Glucose-Capillary: 99 mg/dL (ref 70–99)
Glucose-Capillary: 117 mg/dL — ABNORMAL HIGH (ref 70–99)
Glucose-Capillary: 123 mg/dL — ABNORMAL HIGH (ref 70–99)

## 2024-02-21 LAB — TROPONIN I (HIGH SENSITIVITY): Troponin I (High Sensitivity): 15 ng/L (ref <18)

## 2024-02-21 MED ORDER — DIAZEPAM 5 MG PO TABS: 5.0000 mg | ORAL_TABLET | Freq: Four times a day (QID) | ORAL | 0 refills | Status: AC

## 2024-02-21 MED ORDER — PROSOURCE TF20 ENFIT COMPATIBL EN LIQD: 60.0000 mL | Freq: Two times a day (BID) | ENTERAL | 0 refills | Status: AC

## 2024-02-21 MED ORDER — SODIUM CHLORIDE 0.9 % IV SOLN: 2.0000 g | Freq: Three times a day (TID) | INTRAVENOUS | 0 refills | Status: AC

## 2024-02-21 MED ORDER — INSULIN ASPART 100 UNIT/ML IJ SOLN
0.0000 [IU] | INTRAMUSCULAR | Status: DC
  Administered 2024-02-21: 2 [IU] via SUBCUTANEOUS

## 2024-02-21 MED ORDER — METOCLOPRAMIDE HCL 5 MG/ML IJ SOLN: 10.0000 mg | Freq: Three times a day (TID) | INTRAMUSCULAR | 0 refills | Status: AC

## 2024-02-21 MED ORDER — ONDANSETRON HCL 4 MG/2ML IJ SOLN: 4.0000 mg | Freq: Four times a day (QID) | INTRAMUSCULAR | 0 refills | Status: AC | PRN

## 2024-02-21 MED ORDER — LIDOCAINE 5 % EX PTCH
2.0000 | MEDICATED_PATCH | CUTANEOUS | 0 refills | Status: AC
Start: 2024-02-21 — End: ?

## 2024-02-21 MED ORDER — ENOXAPARIN SODIUM 30 MG/0.3ML IJ SOSY: 30.0000 mg | PREFILLED_SYRINGE | Freq: Two times a day (BID) | INTRAMUSCULAR | 0 refills | Status: AC

## 2024-02-21 MED ORDER — DIAZEPAM 5 MG PO TABS
5.0000 mg | ORAL_TABLET | Freq: Four times a day (QID) | ORAL | Status: DC
  Administered 2024-02-21: 5 mg
  Filled 2024-02-21: qty 1

## 2024-02-21 MED ORDER — POLYETHYLENE GLYCOL 3350 17 G PO PACK
17.0000 g | PACK | Freq: Every day | ORAL | 0 refills | Status: AC | PRN
Start: 2024-02-21 — End: ?

## 2024-02-21 MED ORDER — GUAIFENESIN 100 MG/5ML PO LIQD
15.0000 mL | Freq: Four times a day (QID) | ORAL | 0 refills | Status: AC
Start: 2024-02-21 — End: ?

## 2024-02-21 MED ORDER — FOLIC ACID 1 MG PO TABS: 1.0000 mg | ORAL_TABLET | Freq: Every day | ORAL | 0 refills | Status: AC

## 2024-02-21 MED ORDER — BISACODYL 10 MG RE SUPP: 10.0000 mg | Freq: Every day | RECTAL | 0 refills | Status: AC | PRN

## 2024-02-21 MED ORDER — ONDANSETRON 4 MG PO TBDP: 4.0000 mg | ORAL_TABLET | Freq: Four times a day (QID) | ORAL | 0 refills | Status: AC | PRN

## 2024-02-21 MED ORDER — POTASSIUM CHLORIDE 20 MEQ PO PACK
40.0000 meq | PACK | ORAL | Status: AC
  Administered 2024-02-21 (×2): 40 meq
  Filled 2024-02-21 (×2): qty 2

## 2024-02-21 MED ORDER — DOCUSATE SODIUM 50 MG/5ML PO LIQD: 100.0000 mg | Freq: Two times a day (BID) | ORAL | 0 refills | Status: AC

## 2024-02-21 MED ORDER — MORPHINE SULFATE (PF) 2 MG/ML IV SOLN
2.0000 mg | INTRAVENOUS | 0 refills | Status: AC | PRN
Start: 2024-02-21 — End: ?

## 2024-02-21 MED ORDER — ADULT MULTIVITAMIN W/MINERALS CH
1.0000 | ORAL_TABLET | Freq: Every day | ORAL | 0 refills | Status: AC
Start: 2024-02-22 — End: ?

## 2024-02-21 MED ORDER — QUETIAPINE FUMARATE 25 MG PO TABS
50.0000 mg | ORAL_TABLET | Freq: Two times a day (BID) | ORAL | Status: DC
  Administered 2024-02-21: 50 mg
  Filled 2024-02-21: qty 2

## 2024-02-21 MED ORDER — MORPHINE SULFATE (PF) 2 MG/ML IV SOLN: 2.0000 mg | INTRAVENOUS | Status: DC | PRN

## 2024-02-21 MED ORDER — QUETIAPINE FUMARATE 50 MG PO TABS: 50.0000 mg | ORAL_TABLET | Freq: Two times a day (BID) | ORAL | 0 refills | Status: AC

## 2024-02-21 MED ORDER — INSULIN ASPART 100 UNIT/ML IJ SOLN
0.0000 [IU] | INTRAMUSCULAR | 11 refills | Status: AC
Start: 2024-02-21 — End: ?

## 2024-02-21 MED ORDER — OXYCODONE HCL 5 MG PO TABS
5.0000 mg | ORAL_TABLET | ORAL | 0 refills | Status: AC | PRN
Start: 2024-02-21 — End: ?

## 2024-02-21 MED ORDER — VITAL 1.5 CAL PO LIQD: 1000.0000 mL | ORAL | 0 refills | Status: AC

## 2024-02-21 MED ORDER — VALPROATE SODIUM 100 MG/ML IV SOLN: 750.0000 mg | Freq: Three times a day (TID) | INTRAVENOUS | 0 refills | Status: AC

## 2024-02-21 MED ORDER — MIDAZOLAM HCL 2 MG/2ML IJ SOLN
1.0000 mg | INTRAMUSCULAR | 0 refills | Status: AC | PRN
Start: 2024-02-21 — End: ?

## 2024-02-21 MED ORDER — IPRATROPIUM-ALBUTEROL 0.5-2.5 (3) MG/3ML IN SOLN: 3.0000 mL | Freq: Three times a day (TID) | RESPIRATORY_TRACT | 0 refills | Status: AC

## 2024-02-21 MED ORDER — POLYETHYLENE GLYCOL 3350 17 G PO PACK: 17.0000 g | PACK | Freq: Every day | ORAL | 0 refills | Status: AC

## 2024-02-21 NOTE — Progress Notes (Signed)
 Pt transported from [facility] to [facility] by RN and RT w/o complications

## 2024-02-21 NOTE — Discharge Summary (Addendum)
 Patient ID: Jason Rojas [id number] 01-18-1961 63 y.o.  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 L Rib FX 3-10  - multimodal pain control - IS/Pulm toilet L hemopneumothorax  - resolved, multimodal pain control L pulmonary contusion COPD/acute hypoxic respiratory failure  - PRN albuterol  nebs HTN - home meds Hx Prostate CA  - [id number]; bone met on R 10th rib; completed radiation therapy. On ADT w/ Zytiga/prednisone  daily at home - will resume once can take PO (cannot be given per tube) Acute urinary retention  - foley out, voiding, CRT good   FEN - TNA, ileus improving, TF to goal today. Maximize prns, cont VPA, add valium , incr seroquel , adv TF to goal VTE - SCD's, LMWH ID - completed Rocephin  for strep pneumo PNA, new cx 5/22 with GNRs, empiric cefepime , bcx with S epi suspect contaminant, resend Dispo - ICU, vent/sedation wean, pending LTACH placement - discussed with TOC, medically stable for transfer.    Physical Exam: Gen: comfortable, no distress Neuro: not following commands HEENT: PERRL Neck: supple CV: RRR Pulm: unlabored breathing on mechanical ventilation-full support Abd: soft, NT  , +BM GU: urine clear and yellow, +spontaneous voids Extr: wwp, no edema  Allergies as of 02/21/2024       Reactions   Tuberculin Purified Protein  Derivative Swelling        Medication List     TAKE these medications    abiraterone acetate 250 MG tablet Commonly known as: ZYTIGA Take 1,000 mg by mouth daily. Take on an empty stomach 1 hour before or 2 hours after a meal   acetaminophen  500 MG tablet Commonly known as: TYLENOL  Take 1,000 mg by mouth every 6 (six) hours as needed for moderate pain (pain score 4-6) or mild pain (pain score 1-3).   albuterol  108 (90 Base) MCG/ACT inhaler Commonly known as: VENTOLIN  HFA Inhale 2 puffs into the lungs every 4 (four) hours as needed for wheezing or shortness of breath.   amLODipine  5 MG tablet Commonly known as: NORVASC  Take 5 mg by mouth daily.   bisacodyl  10 MG suppository Commonly known as: DULCOLAX Place 1 suppository (10 mg total) rectally daily as needed for moderate constipation.   carvedilol  25 MG tablet Commonly known as: COREG  Take 12.5 mg by mouth 2 (two) times daily with a meal.   ceFEPIme  2 g in sodium chloride  0.9 % 100 mL Inject 2 g into the vein every 8 (eight) hours.   diazepam  5 MG tablet Commonly known as: VALIUM  Place 1 tablet (5 mg total) into feeding tube every 6 (six) hours.   docusate 50 MG/5ML liquid Commonly known as: COLACE Place 10 mLs (100 mg total) into feeding tube 2 (two) times daily.   dorzolamide-timolol 2-0.5 % ophthalmic solution Commonly known as: COSOPT Place 2 drops into the right eye daily.   enoxaparin  30 MG/0.3ML injection Commonly known as: LOVENOX   Inject 0.3 mLs (30 mg total) into the skin every 12 (twelve) hours.   feeding supplement (PROSource TF20) liquid Place 60 mLs into feeding tube 2 (two) times daily.   feeding supplement (VITAL 1.5 CAL) Liqd Place 1,000 mLs into feeding tube continuous.   folic acid  1 MG tablet Commonly known as: FOLVITE  Place 1 tablet (1 mg total) into feeding tube daily. Start taking on: Feb 22, 2024   gabapentin 800 MG tablet Commonly known as: NEURONTIN Take 800 mg by mouth 3  (three) times daily as needed (for pain/nerves).   guaiFENesin  100 MG/5ML liquid Commonly known as: ROBITUSSIN Place 15 mLs into feeding tube every 6 (six) hours.   insulin  aspart 100 UNIT/ML injection Commonly known as: novoLOG  Inject 0-15 Units into the skin every 4 (four) hours.   ipratropium-albuterol  0.5-2.5 (3) MG/3ML Soln Commonly known as: DUONEB Take 3 mLs by nebulization 3 (three) times daily.   lidocaine  5 % Commonly known as: LIDODERM  Place 2 patches onto the skin daily. Remove & Discard patch within 12 hours or as directed by MD   metoCLOPramide  5 MG/ML injection Commonly known as: REGLAN  Inject 2 mLs (10 mg total) into the vein every 8 (eight) hours.   midazolam  2 MG/2ML Soln injection Commonly known as: VERSED  Inject 1-2 mLs (1-2 mg total) into the vein every hour as needed for agitation.   morphine  (PF) 2 MG/ML injection Inject 1-2 mLs (2-4 mg total) into the vein every 4 (four) hours as needed (2mg  for moderate pain, 4mg  for severe pain; when taking PO please use oral meds preferentially).   multivitamin with minerals Tabs tablet Place 1 tablet into feeding tube daily. Start taking on: Feb 22, 2024   ondansetron  4 MG disintegrating tablet Commonly known as: ZOFRAN -ODT Take 1 tablet (4 mg total) by mouth every 6 (six) hours as needed for nausea.   ondansetron  4 MG/2ML Soln injection Commonly known as: ZOFRAN  Inject 2 mLs (4 mg total) into the vein every 6 (six) hours as needed for nausea.   oxyCODONE  5 MG immediate release tablet Commonly known as: Oxy IR/ROXICODONE  Place 1-2 tablets (5-10 mg total) into feeding tube every 4 (four) hours as needed for moderate pain (pain score 4-6) or severe pain (pain score 7-10) (5mg  for moderate pain, 10mg  for severe pain).   polyethylene glycol 17 g packet Commonly known as: MIRALAX  / GLYCOLAX  Place 17 g into feeding tube daily as needed (constipation).   polyethylene glycol 17 g packet Commonly known as: MIRALAX   / GLYCOLAX  Place 17 g into feeding tube daily. Start taking on: Feb 22, 2024   predniSONE  5 MG tablet Commonly known as: DELTASONE  Take 5 mg by mouth daily with breakfast.   QUEtiapine  50 MG tablet Commonly known as: SEROQUEL  Place 1 tablet (50 mg total) into feeding tube 2 (two) times daily.   sildenafil 100 MG tablet Commonly known as: VIAGRA Take 100 mg by mouth as needed for erectile dysfunction.   valproate 750 mg in dextrose  5 % 50 mL Inject 750 mg into the vein every 8 (eight) hours.          Follow-up Information     [facility] Follow up.   Why: As needed Contact information: [address] [place]  [address] [phone]                 Signed: Anda Bamberg, MD [facility] 02/21/2024, 10:22 AM

## 2024-02-21 NOTE — Progress Notes (Addendum)
 Trauma/Critical Care Follow Up Note  Subjective:    Overnight Issues:   Objective:  Vital signs for last 24 hours: Temp:  [98.4 F (36.9 C)-99.6 F (37.6 C)] 99.3 F (37.4 C) (05/23 0400) Pulse Rate:  [66-117] 83 (05/23 0600) Resp:  [21-45] 25 (05/23 0728) BP: (96-164)/(56-87) 121/59 (05/23 0600) SpO2:  [87 %-97 %] 91 % (05/23 0600) FiO2 (%):  [40 %-60 %] 50 % (05/23 0728) Weight:  [105.6 kg] 105.6 kg (05/23 0500)  Hemodynamic parameters for last 24 hours:    Intake/Output from previous day: 05/22 0701 - 05/23 0700 In: 2441.5 [I.V.:1164.6; NG/GT:861.8; IV Piggyback:415.1] Out: 1400 [Urine:1200; Stool:200]  Intake/Output this shift: No intake/output data recorded.  Vent settings for last 24 hours: Vent Mode: PCV FiO2 (%):  [40 %-60 %] 50 % Set Rate:  [15 bmp] 15 bmp Vt Set:  [510 mL] 510 mL PEEP:  [5 cmH20] 5 cmH20 Pressure Support:  [10 cmH20] 10 cmH20 Plateau Pressure:  [21 cmH20-26 cmH20] 21 cmH20  Physical Exam:  Gen: comfortable, no distress Neuro: not following commands HEENT: PERRL Neck: supple CV: RRR Pulm: unlabored breathing on mechanical ventilation-full support Abd: soft, NT  , +BM GU: urine clear and yellow, +spontaneous voids Extr: wwp, no edema  Results for orders placed or performed during the hospital encounter of 01/24/24 (from the past 24 hours)  Glucose, capillary     Status: None   Collection Time: 02/20/24 11:34 AM  Result Value Ref Range   Glucose-Capillary 94 70 - 99 mg/dL  Glucose, capillary     Status: Abnormal   Collection Time: 02/20/24  3:38 PM  Result Value Ref Range   Glucose-Capillary 127 (H) 70 - 99 mg/dL  Glucose, capillary     Status: None   Collection Time: 02/20/24  7:21 PM  Result Value Ref Range   Glucose-Capillary 94 70 - 99 mg/dL  Glucose, capillary     Status: Abnormal   Collection Time: 02/20/24 11:12 PM  Result Value Ref Range   Glucose-Capillary 108 (H) 70 - 99 mg/dL  Glucose, capillary     Status: None    Collection Time: 02/21/24  3:31 AM  Result Value Ref Range   Glucose-Capillary 99 70 - 99 mg/dL  CBC     Status: Abnormal   Collection Time: 02/21/24  5:03 AM  Result Value Ref Range   WBC 9.1 4.0 - 10.5 K/uL   RBC 2.88 (L) 4.22 - 5.81 MIL/uL   Hemoglobin 7.6 (L) 13.0 - 17.0 g/dL   HCT 16.1 (L) 09.6 - 04.5 %   MCV 87.8 80.0 - 100.0 fL   MCH 26.4 26.0 - 34.0 pg   MCHC 30.0 30.0 - 36.0 g/dL   RDW 40.9 81.1 - 91.4 %   Platelets 305 150 - 400 K/uL   nRBC 0.0 0.0 - 0.2 %  Basic metabolic panel with GFR     Status: Abnormal   Collection Time: 02/21/24  5:03 AM  Result Value Ref Range   Sodium 141 135 - 145 mmol/L   Potassium 3.4 (L) 3.5 - 5.1 mmol/L   Chloride 110 98 - 111 mmol/L   CO2 22 22 - 32 mmol/L   Glucose, Bld 109 (H) 70 - 99 mg/dL   BUN 23 8 - 23 mg/dL   Creatinine, Ser 7.82 (L) 0.61 - 1.24 mg/dL   Calcium 8.1 (L) 8.9 - 10.3 mg/dL   GFR, Estimated >95 >62 mL/min   Anion gap 9 5 - 15  Assessment & Plan: The plan of care was discussed with the bedside nurse for the day, who is in agreement with this plan and no additional concerns were raised.   Present on Admission:  Fall    LOS: 28 days   Additional comments:I reviewed the patient's new clinical lab test results.   and I reviewed the patients new imaging test results.    63 y/o M S/P fall on stairs   Acute hypoxic ventilator dependent respiratory failure  - continue weaning as able, trach 5/12-proximal XLT. L Rib FX 3-10  - multimodal pain control - IS/Pulm toilet L hemopneumothorax  - resolved, multimodal pain control L pulmonary contusion COPD/acute hypoxic respiratory failure  - PRN albuterol  nebs HTN - home meds Hx Prostate CA  - [id number]; bone met on R 10th rib; completed radiation therapy. On ADT w/ Zytiga/prednisone  daily at home - will resume once can take PO (cannot be given per tube) Acute urinary retention  - foley out, voiding, CRT good   FEN - TNA, ileus improving, TF to goal today.  Maximize prns, cont VPA, add valium , incr seroquel , adv TF to goal VTE - SCD's, LMWH ID - completed Rocephin  for strep pneumo PNA, new cx 5/22 with GNRs, empiric cefepime , bcx with S epi suspect contaminant, resend Dispo - ICU, vent/sedation wean, pending LTACH placement - discussed with TOC, medically stable for transfer.   Critical Care Total Time: 40 minutes  Anda Bamberg, MD Trauma & General Surgery Please use [email or web address] to contact on call provider  02/21/2024  *Care during the described time interval was provided by me. I have reviewed this patient's available data, including medical history, events of note, physical examination and test results as part of my evaluation.

## 2024-02-21 NOTE — TOC Transition Note (Addendum)
 Transition of Care [facility]) - Discharge Note   Patient Details  Name: Jason Rojas MRN: [id number] Date of Birth: 11-27-1960  [facility] CM/SW Contact:  Miking Usrey E Isyss Espinal, LCSW Phone Number: 02/21/2024, 10:29 AM   Clinical Narrative:    Patient is approved to transfer to [facility] today- Room [phone]. Ciera with [facility] has updated the daughter.    Final next level of care: Long Term Acute Care (LTAC) Barriers to Discharge: Barriers Resolved   Patient Goals and CMS Choice Patient states their goals for this hospitalization and ongoing recovery are:: LTAC CMS Medicare.gov Compare Post Acute Care list provided to:: Patient Represenative (must comment) Choice offered to / list presented to : Adult Children      Discharge Placement                    Patient and family notified of of transfer: 02/21/24  Discharge Plan and Services Additional resources added to the After Visit Summary for     Discharge Planning Services: CM Consult                                 Social Drivers of Health (SDOH) Interventions SDOH Screenings   Food Insecurity: No Food Insecurity (01/25/2024)  Housing: Low Risk  (01/25/2024)  Transportation Needs: No Transportation Needs (01/27/2024)  Utilities: Not At Risk (01/27/2024)  Tobacco Use: High Risk (01/24/2024)     Readmission Risk Interventions    01/28/2024   12:04 PM  Readmission Risk Prevention Plan  Transportation Screening Complete  PCP or Specialist Appt within 3-5 Days Complete  HRI or Home Care Consult Complete  Social Work Consult for Recovery Care Planning/Counseling Complete  Palliative Care Screening Not Applicable  Medication Review Oceanographer) Complete

## 2024-02-21 NOTE — Progress Notes (Signed)
 Pt placed on trach collar per Dr. Lita Rieger request.  Pt desaturated to 77% in less than one minute, was extremely agitated and tachypneic.  Pt placed on vent in PCV mode at previous settings.  Pt's RN informed.  Pt remains tachypneic but o2 saturations improved to 92% when on the ventilator.

## 2024-02-22 ENCOUNTER — Institutional Professional Consult (permissible substitution) (HOSPITAL_COMMUNITY): Payer: Self-pay

## 2024-02-22 LAB — CBC
HCT: 28.6 % — ABNORMAL LOW (ref 39.0–52.0)
HCT: 29 % — ABNORMAL LOW (ref 39.0–52.0)
Hemoglobin: 8.7 g/dL — ABNORMAL LOW (ref 13.0–17.0)
Hemoglobin: 8.9 g/dL — ABNORMAL LOW (ref 13.0–17.0)
MCH: 26.2 pg (ref 26.0–34.0)
MCH: 26.3 pg (ref 26.0–34.0)
MCHC: 30.4 g/dL (ref 30.0–36.0)
MCHC: 30.7 g/dL (ref 30.0–36.0)
MCV: 86.1 fL (ref 80.0–100.0)
MCV: 85.8 fL (ref 80.0–100.0)
Platelets: 330 10*3/uL (ref 150–400)
Platelets: 317 10*3/uL (ref 150–400)
RBC: 3.32 MIL/uL — ABNORMAL LOW (ref 4.22–5.81)
RBC: 3.38 MIL/uL — ABNORMAL LOW (ref 4.22–5.81)
RDW: 15 % (ref 11.5–15.5)
RDW: 15 % (ref 11.5–15.5)
WBC: 12.2 10*3/uL — ABNORMAL HIGH (ref 4.0–10.5)
WBC: 9.8 10*3/uL (ref 4.0–10.5)
nRBC: 0 % (ref 0.0–0.2)
nRBC: 0 % (ref 0.0–0.2)

## 2024-02-22 LAB — CBC WITH DIFFERENTIAL/PLATELET
Abs Immature Granulocytes: 0.15 10*3/uL — ABNORMAL HIGH (ref 0.00–0.07)
Basophils Absolute: 0 10*3/uL (ref 0.0–0.1)
Basophils Relative: 0 %
Eosinophils Absolute: 0 10*3/uL (ref 0.0–0.5)
Eosinophils Relative: 0 %
HCT: 30.2 % — ABNORMAL LOW (ref 39.0–52.0)
Hemoglobin: 9.3 g/dL — ABNORMAL LOW (ref 13.0–17.0)
Immature Granulocytes: 1 %
Lymphocytes Relative: 5 %
Lymphs Abs: 0.7 10*3/uL (ref 0.7–4.0)
MCH: 26.3 pg (ref 26.0–34.0)
MCHC: 30.8 g/dL (ref 30.0–36.0)
MCV: 85.3 fL (ref 80.0–100.0)
Monocytes Absolute: 1.1 10*3/uL — ABNORMAL HIGH (ref 0.1–1.0)
Monocytes Relative: 8 %
Neutro Abs: 12.3 10*3/uL — ABNORMAL HIGH (ref 1.7–7.7)
Neutrophils Relative %: 86 %
Platelets: 382 10*3/uL (ref 150–400)
RBC: 3.54 MIL/uL — ABNORMAL LOW (ref 4.22–5.81)
RDW: 14.9 % (ref 11.5–15.5)
WBC: 14.3 10*3/uL — ABNORMAL HIGH (ref 4.0–10.5)
nRBC: 0 % (ref 0.0–0.2)

## 2024-02-22 LAB — COMPREHENSIVE METABOLIC PANEL WITH GFR
ALT: 77 U/L — ABNORMAL HIGH (ref 0–44)
AST: 28 U/L (ref 15–41)
Albumin: 2.6 g/dL — ABNORMAL LOW (ref 3.5–5.0)
Alkaline Phosphatase: 145 U/L — ABNORMAL HIGH (ref 38–126)
Anion gap: 15 (ref 5–15)
BUN: 23 mg/dL (ref 8–23)
CO2: 24 mmol/L (ref 22–32)
Calcium: 9.5 mg/dL (ref 8.9–10.3)
Chloride: 106 mmol/L (ref 98–111)
Creatinine, Ser: 0.49 mg/dL — ABNORMAL LOW (ref 0.61–1.24)
GFR, Estimated: >60 mL/min (ref >60)
Glucose, Bld: 128 mg/dL — ABNORMAL HIGH (ref 70–99)
Potassium: 3.9 mmol/L (ref 3.5–5.1)
Sodium: 145 mmol/L (ref 135–145)
Total Bilirubin: 0.6 mg/dL (ref 0.0–1.2)
Total Protein: 6.8 g/dL (ref 6.5–8.1)

## 2024-02-22 LAB — ECHOCARDIOGRAM COMPLETE
AR max vel: 2.89 cm2
AV Area VTI: 3.2 cm2
AV Area mean vel: 2.92 cm2
AV Mean grad: 6 mmHg
AV Peak grad: 11.2 mmHg
Ao pk vel: 1.67 m/s
Area-P 1/2: 4.21 cm2
Calc EF: 73.7 %
S' Lateral: 3 cm
Single Plane A2C EF: 70.1 %
Single Plane A4C EF: 77.3 %
Weight: 3724.89 [oz_av]

## 2024-02-22 LAB — CULTURE, RESPIRATORY W GRAM STAIN

## 2024-02-22 LAB — BLOOD GAS, ARTERIAL
Acid-Base Excess: 4.7 mmol/L — ABNORMAL HIGH (ref 0.0–2.0)
Bicarbonate: 29.8 mmol/L — ABNORMAL HIGH (ref 20.0–28.0)
O2 Saturation: 98.7 %
Patient temperature: 36.7
pCO2 arterial: 45 mmHg (ref 32–48)
pH, Arterial: 7.42 (ref 7.35–7.45)
pO2, Arterial: 85 mmHg (ref 83–108)

## 2024-02-22 LAB — TSH: TSH: 1.749 u[IU]/mL (ref 0.350–4.500)

## 2024-02-22 LAB — HEMOGLOBIN A1C
Hgb A1c MFr Bld: 5.4 % (ref 4.8–5.6)
Mean Plasma Glucose: 108.28 mg/dL

## 2024-02-22 MED ORDER — IOHEXOL 350 MG/ML SOLN
75.0000 mL | Freq: Once | INTRAVENOUS | Status: AC | PRN
  Administered 2024-02-22: 75 mL via INTRAVENOUS

## 2024-02-22 NOTE — Progress Notes (Signed)
 Echocardiogram 2D Echocardiogram has been performed.  Demetress Tift N Param Capri,RDCS 02/22/2024, 8:33 AM

## 2024-02-22 NOTE — Progress Notes (Signed)
        Date: 02/22/2024  Patient name: Jason Rojas  Medical record number: [id number]  Date of birth: 1961/09/12   I was alerted to + blood cultures on this patient not because of MRSE but because cultures turned + after 3 days in the hospital concerning for nosocomial bacteremia.  I have called the lab and there is  indeed Staph epi growing from TWO different sites though they are BOTH labelled LEFT HAND and listed as having been collected only 1 minute apart making me question whether these were really 2 different "sticks."  The BCID ID MeCA meaning would  be [id number]  Repeat blood cultures taken yesterday are NGTD 24 hours  I have asked micro to run sensis on BOTH of the isolates from [id number] though again if they really represented one stick they should both grown the same org. I have no  way of disproving that they were separate "sticks"  At present the patient is without coverage fro Staph epi   I will start daptomycin to cover MRSE while we await clarity from micro and the culture data.    Deberah Falconer [place] 02/22/2024, 3:13 PM

## 2024-02-23 LAB — CBC
HCT: 25.9 % — ABNORMAL LOW (ref 39.0–52.0)
HCT: 26 % — ABNORMAL LOW (ref 39.0–52.0)
HCT: 24.7 % — ABNORMAL LOW (ref 39.0–52.0)
HCT: 25.7 % — ABNORMAL LOW (ref 39.0–52.0)
Hemoglobin: 7.9 g/dL — ABNORMAL LOW (ref 13.0–17.0)
Hemoglobin: 7.9 g/dL — ABNORMAL LOW (ref 13.0–17.0)
Hemoglobin: 7.7 g/dL — ABNORMAL LOW (ref 13.0–17.0)
Hemoglobin: 8.1 g/dL — ABNORMAL LOW (ref 13.0–17.0)
MCH: 25.9 pg — ABNORMAL LOW (ref 26.0–34.0)
MCH: 26 pg (ref 26.0–34.0)
MCH: 26.1 pg (ref 26.0–34.0)
MCH: 26.2 pg (ref 26.0–34.0)
MCHC: 30.5 g/dL (ref 30.0–36.0)
MCHC: 30.4 g/dL (ref 30.0–36.0)
MCHC: 31.2 g/dL (ref 30.0–36.0)
MCHC: 31.5 g/dL (ref 30.0–36.0)
MCV: 84.9 fL (ref 80.0–100.0)
MCV: 85.5 fL (ref 80.0–100.0)
MCV: 83.7 fL (ref 80.0–100.0)
MCV: 83.2 fL (ref 80.0–100.0)
Platelets: 310 10*3/uL (ref 150–400)
Platelets: 296 10*3/uL (ref 150–400)
Platelets: 285 10*3/uL (ref 150–400)
Platelets: 299 10*3/uL (ref 150–400)
RBC: 3.05 MIL/uL — ABNORMAL LOW (ref 4.22–5.81)
RBC: 3.04 MIL/uL — ABNORMAL LOW (ref 4.22–5.81)
RBC: 2.95 MIL/uL — ABNORMAL LOW (ref 4.22–5.81)
RBC: 3.09 MIL/uL — ABNORMAL LOW (ref 4.22–5.81)
RDW: 14.9 % (ref 11.5–15.5)
RDW: 14.8 % (ref 11.5–15.5)
RDW: 14.8 % (ref 11.5–15.5)
RDW: 14.9 % (ref 11.5–15.5)
WBC: 9.5 10*3/uL (ref 4.0–10.5)
WBC: 9 10*3/uL (ref 4.0–10.5)
WBC: 7.1 10*3/uL (ref 4.0–10.5)
WBC: 7 10*3/uL (ref 4.0–10.5)
nRBC: 0 % (ref 0.0–0.2)
nRBC: 0 % (ref 0.0–0.2)
nRBC: 0 % (ref 0.0–0.2)
nRBC: 0 % (ref 0.0–0.2)

## 2024-02-23 LAB — CULTURE, BLOOD (ROUTINE X 2)
Special Requests: ADEQUATE
Special Requests: ADEQUATE

## 2024-02-23 NOTE — Progress Notes (Signed)
         Date: 02/23/2024  Patient name: Jason Rojas  Medical record number: [id number]  Date of birth: April 14, 1961   MRSE sensis are identical from the TWO cultures though as I mentoned yesterday I have a high degree of this is him that these are indeed cultures taken from 2 different sites as they both are labeled left hand and they are labeled as having been collected 1 minute apart.  He has repeat blood cultures without effective antimicrobial therapy have been without growth for 2 days.  I started daptomycin yesterday I will continue the daptomycin until we see what the repeat blood cultures show and if they are without growth at 5 days I think this antibiotic can be stopped and these organisms regarded as contaminants   My understanding is that he is going to be discharged to a LTACH  I will remove him from our rounding list for now and add him to list of possible noscomial infections though I believe this is likely instead contamination without TWO true separate blood cultures having been collected   [employer] 02/23/2024, 3:00 PM

## 2024-02-24 ENCOUNTER — Institutional Professional Consult (permissible substitution) (HOSPITAL_COMMUNITY): Payer: Self-pay

## 2024-02-24 DIAGNOSIS — G931 Anoxic brain damage, not elsewhere classified: Secondary | ICD-10-CM

## 2024-02-24 DIAGNOSIS — R6520 Severe sepsis without septic shock: Secondary | ICD-10-CM

## 2024-02-24 DIAGNOSIS — Y95 Nosocomial condition: Secondary | ICD-10-CM

## 2024-02-24 DIAGNOSIS — J9621 Acute and chronic respiratory failure with hypoxia: Secondary | ICD-10-CM

## 2024-02-24 DIAGNOSIS — J189 Pneumonia, unspecified organism: Secondary | ICD-10-CM

## 2024-02-24 DIAGNOSIS — Z930 Tracheostomy status: Secondary | ICD-10-CM

## 2024-02-24 LAB — CBC
HCT: 26.8 % — ABNORMAL LOW (ref 39.0–52.0)
HCT: 27.6 % — ABNORMAL LOW (ref 39.0–52.0)
HCT: 27.3 % — ABNORMAL LOW (ref 39.0–52.0)
Hemoglobin: 8.3 g/dL — ABNORMAL LOW (ref 13.0–17.0)
Hemoglobin: 8.6 g/dL — ABNORMAL LOW (ref 13.0–17.0)
Hemoglobin: 8.5 g/dL — ABNORMAL LOW (ref 13.0–17.0)
MCH: 25.8 pg — ABNORMAL LOW (ref 26.0–34.0)
MCH: 25.7 pg — ABNORMAL LOW (ref 26.0–34.0)
MCH: 25.8 pg — ABNORMAL LOW (ref 26.0–34.0)
MCHC: 31 g/dL (ref 30.0–36.0)
MCHC: 31.2 g/dL (ref 30.0–36.0)
MCHC: 31.1 g/dL (ref 30.0–36.0)
MCV: 83.2 fL (ref 80.0–100.0)
MCV: 82.6 fL (ref 80.0–100.0)
MCV: 83 fL (ref 80.0–100.0)
Platelets: 291 10*3/uL (ref 150–400)
Platelets: 308 10*3/uL (ref 150–400)
Platelets: 296 10*3/uL (ref 150–400)
RBC: 3.22 MIL/uL — ABNORMAL LOW (ref 4.22–5.81)
RBC: 3.34 MIL/uL — ABNORMAL LOW (ref 4.22–5.81)
RBC: 3.29 MIL/uL — ABNORMAL LOW (ref 4.22–5.81)
RDW: 14.8 % (ref 11.5–15.5)
RDW: 14.6 % (ref 11.5–15.5)
RDW: 14.8 % (ref 11.5–15.5)
WBC: 6.4 10*3/uL (ref 4.0–10.5)
WBC: 8.1 10*3/uL (ref 4.0–10.5)
WBC: 8.6 10*3/uL (ref 4.0–10.5)
nRBC: 0 % (ref 0.0–0.2)
nRBC: 0 % (ref 0.0–0.2)
nRBC: 0 % (ref 0.0–0.2)

## 2024-02-25 DIAGNOSIS — J189 Pneumonia, unspecified organism: Secondary | ICD-10-CM

## 2024-02-25 DIAGNOSIS — Y95 Nosocomial condition: Secondary | ICD-10-CM

## 2024-02-25 DIAGNOSIS — G931 Anoxic brain damage, not elsewhere classified: Secondary | ICD-10-CM

## 2024-02-25 DIAGNOSIS — J9621 Acute and chronic respiratory failure with hypoxia: Secondary | ICD-10-CM

## 2024-02-25 DIAGNOSIS — R6520 Severe sepsis without septic shock: Secondary | ICD-10-CM

## 2024-02-25 DIAGNOSIS — Z930 Tracheostomy status: Secondary | ICD-10-CM

## 2024-02-25 LAB — BASIC METABOLIC PANEL WITH GFR
Anion gap: 10 (ref 5–15)
BUN: 17 mg/dL (ref 8–23)
CO2: 26 mmol/L (ref 22–32)
Calcium: 8.2 mg/dL — ABNORMAL LOW (ref 8.9–10.3)
Chloride: 109 mmol/L (ref 98–111)
Creatinine, Ser: 0.53 mg/dL — ABNORMAL LOW (ref 0.61–1.24)
GFR, Estimated: >60 mL/min (ref >60)
Glucose, Bld: 109 mg/dL — ABNORMAL HIGH (ref 70–99)
Potassium: 2.6 mmol/L — CL (ref 3.5–5.1)
Sodium: 145 mmol/L (ref 135–145)

## 2024-02-25 LAB — MAGNESIUM: Magnesium: 1.9 mg/dL (ref 1.7–2.4)

## 2024-02-26 ENCOUNTER — Institutional Professional Consult (permissible substitution) (HOSPITAL_COMMUNITY): Payer: Self-pay

## 2024-02-26 DIAGNOSIS — R6520 Severe sepsis without septic shock: Secondary | ICD-10-CM

## 2024-02-26 DIAGNOSIS — G931 Anoxic brain damage, not elsewhere classified: Secondary | ICD-10-CM

## 2024-02-26 DIAGNOSIS — J9621 Acute and chronic respiratory failure with hypoxia: Secondary | ICD-10-CM

## 2024-02-26 DIAGNOSIS — Y95 Nosocomial condition: Secondary | ICD-10-CM

## 2024-02-26 DIAGNOSIS — J189 Pneumonia, unspecified organism: Secondary | ICD-10-CM

## 2024-02-26 DIAGNOSIS — Z930 Tracheostomy status: Secondary | ICD-10-CM

## 2024-02-26 LAB — CULTURE, BLOOD (ROUTINE X 2)
Culture: NO GROWTH
Culture: NO GROWTH
Special Requests: ADEQUATE
Special Requests: ADEQUATE

## 2024-02-26 LAB — BASIC METABOLIC PANEL WITH GFR
Anion gap: 12 (ref 5–15)
BUN: 12 mg/dL (ref 8–23)
CO2: 26 mmol/L (ref 22–32)
Calcium: 8.2 mg/dL — ABNORMAL LOW (ref 8.9–10.3)
Chloride: 108 mmol/L (ref 98–111)
Creatinine, Ser: 0.49 mg/dL — ABNORMAL LOW (ref 0.61–1.24)
GFR, Estimated: >60 mL/min (ref >60)
Glucose, Bld: 94 mg/dL (ref 70–99)
Potassium: 2.8 mmol/L — ABNORMAL LOW (ref 3.5–5.1)
Sodium: 146 mmol/L — ABNORMAL HIGH (ref 135–145)

## 2024-02-26 LAB — POTASSIUM: Potassium: 3.5 mmol/L (ref 3.5–5.1)

## 2024-02-27 ENCOUNTER — Institutional Professional Consult (permissible substitution) (HOSPITAL_BASED_OUTPATIENT_CLINIC_OR_DEPARTMENT_OTHER)

## 2024-02-27 ENCOUNTER — Institutional Professional Consult (permissible substitution) (HOSPITAL_COMMUNITY): Payer: Self-pay

## 2024-02-27 DIAGNOSIS — M7989 Other specified soft tissue disorders: Secondary | ICD-10-CM | POA: Diagnosis not present

## 2024-02-27 DIAGNOSIS — R6520 Severe sepsis without septic shock: Secondary | ICD-10-CM

## 2024-02-27 DIAGNOSIS — G931 Anoxic brain damage, not elsewhere classified: Secondary | ICD-10-CM

## 2024-02-27 DIAGNOSIS — J9621 Acute and chronic respiratory failure with hypoxia: Secondary | ICD-10-CM

## 2024-02-27 DIAGNOSIS — J189 Pneumonia, unspecified organism: Secondary | ICD-10-CM

## 2024-02-27 DIAGNOSIS — Z930 Tracheostomy status: Secondary | ICD-10-CM

## 2024-02-27 DIAGNOSIS — Y95 Nosocomial condition: Secondary | ICD-10-CM

## 2024-02-27 MED ORDER — LIDOCAINE HCL 1 % IJ SOLN: 10.0000 mL | Freq: Once | INTRAMUSCULAR | Status: DC

## 2024-02-27 MED ORDER — LIDOCAINE HCL 1 % IJ SOLN
INTRAMUSCULAR | Status: AC
  Filled 2024-02-27: qty 20

## 2024-02-27 NOTE — Progress Notes (Signed)
 VASCULAR LAB    Left upper extremity venous duplex has been performed.  See CV proc for preliminary results.  Gave verbal report to Valorie Gearing, NP  Lanette Pipe, Beuna Bolding, RVT 02/27/2024, 10:57 AM

## 2024-02-27 NOTE — Procedures (Signed)
 PROCEDURE SUMMARY:  Successful image-guided therapeutic left thoracentesis. Yielded 550 mL of blood tinged fluid. Patient tolerated procedure well. EBL: trace No immediate complications.  Specimen not sent for labs. Post procedure CXR ordered.  Please see imaging section of Epic for full dictation.  Damian Duke Anaja Monts PA-C 02/27/2024 11:44 AM

## 2024-02-28 DIAGNOSIS — J189 Pneumonia, unspecified organism: Secondary | ICD-10-CM

## 2024-02-28 DIAGNOSIS — Z930 Tracheostomy status: Secondary | ICD-10-CM

## 2024-02-28 DIAGNOSIS — J9621 Acute and chronic respiratory failure with hypoxia: Secondary | ICD-10-CM

## 2024-02-28 DIAGNOSIS — G931 Anoxic brain damage, not elsewhere classified: Secondary | ICD-10-CM

## 2024-02-28 DIAGNOSIS — Y95 Nosocomial condition: Secondary | ICD-10-CM

## 2024-02-28 DIAGNOSIS — R6520 Severe sepsis without septic shock: Secondary | ICD-10-CM

## 2024-02-29 DIAGNOSIS — Z930 Tracheostomy status: Secondary | ICD-10-CM

## 2024-02-29 DIAGNOSIS — J9621 Acute and chronic respiratory failure with hypoxia: Secondary | ICD-10-CM

## 2024-02-29 DIAGNOSIS — G931 Anoxic brain damage, not elsewhere classified: Secondary | ICD-10-CM

## 2024-02-29 DIAGNOSIS — Y95 Nosocomial condition: Secondary | ICD-10-CM

## 2024-02-29 DIAGNOSIS — J189 Pneumonia, unspecified organism: Secondary | ICD-10-CM

## 2024-02-29 DIAGNOSIS — R6520 Severe sepsis without septic shock: Secondary | ICD-10-CM

## 2024-02-29 LAB — BASIC METABOLIC PANEL WITH GFR
Anion gap: 10 (ref 5–15)
BUN: 13 mg/dL (ref 8–23)
CO2: 29 mmol/L (ref 22–32)
Calcium: 8.6 mg/dL — ABNORMAL LOW (ref 8.9–10.3)
Chloride: 102 mmol/L (ref 98–111)
Creatinine, Ser: 0.51 mg/dL — ABNORMAL LOW (ref 0.61–1.24)
GFR, Estimated: >60 mL/min (ref >60)
Glucose, Bld: 114 mg/dL — ABNORMAL HIGH (ref 70–99)
Potassium: 3.7 mmol/L (ref 3.5–5.1)
Sodium: 141 mmol/L (ref 135–145)

## 2024-02-29 LAB — CBC
HCT: 25.5 % — ABNORMAL LOW (ref 39.0–52.0)
Hemoglobin: 7.9 g/dL — ABNORMAL LOW (ref 13.0–17.0)
MCH: 25.8 pg — ABNORMAL LOW (ref 26.0–34.0)
MCHC: 31 g/dL (ref 30.0–36.0)
MCV: 83.3 fL (ref 80.0–100.0)
Platelets: 255 10*3/uL (ref 150–400)
RBC: 3.06 MIL/uL — ABNORMAL LOW (ref 4.22–5.81)
RDW: 15.4 % (ref 11.5–15.5)
WBC: 6.9 10*3/uL (ref 4.0–10.5)
nRBC: 0 % (ref 0.0–0.2)

## 2024-02-29 LAB — MAGNESIUM: Magnesium: 1.6 mg/dL — ABNORMAL LOW (ref 1.7–2.4)

## 2024-03-01 LAB — MAGNESIUM: Magnesium: 1.9 mg/dL (ref 1.7–2.4)

## 2024-03-04 ENCOUNTER — Institutional Professional Consult (permissible substitution) (HOSPITAL_COMMUNITY): Payer: Self-pay

## 2024-03-04 DIAGNOSIS — J9621 Acute and chronic respiratory failure with hypoxia: Secondary | ICD-10-CM

## 2024-03-04 DIAGNOSIS — R6520 Severe sepsis without septic shock: Secondary | ICD-10-CM

## 2024-03-04 DIAGNOSIS — Z930 Tracheostomy status: Secondary | ICD-10-CM

## 2024-03-04 DIAGNOSIS — J189 Pneumonia, unspecified organism: Secondary | ICD-10-CM

## 2024-03-04 DIAGNOSIS — Y95 Nosocomial condition: Secondary | ICD-10-CM

## 2024-03-04 DIAGNOSIS — G931 Anoxic brain damage, not elsewhere classified: Secondary | ICD-10-CM

## 2024-03-04 LAB — CBC
HCT: 26.9 % — ABNORMAL LOW (ref 39.0–52.0)
Hemoglobin: 8.4 g/dL — ABNORMAL LOW (ref 13.0–17.0)
MCH: 25.1 pg — ABNORMAL LOW (ref 26.0–34.0)
MCHC: 31.2 g/dL (ref 30.0–36.0)
MCV: 80.3 fL (ref 80.0–100.0)
Platelets: 287 10*3/uL (ref 150–400)
RBC: 3.35 MIL/uL — ABNORMAL LOW (ref 4.22–5.81)
RDW: 14.6 % (ref 11.5–15.5)
WBC: 10.8 10*3/uL — ABNORMAL HIGH (ref 4.0–10.5)
nRBC: 0 % (ref 0.0–0.2)

## 2024-03-04 LAB — BASIC METABOLIC PANEL WITH GFR
Anion gap: 10 (ref 5–15)
BUN: 10 mg/dL (ref 8–23)
CO2: 25 mmol/L (ref 22–32)
Calcium: 8.8 mg/dL — ABNORMAL LOW (ref 8.9–10.3)
Chloride: 98 mmol/L (ref 98–111)
Creatinine, Ser: 0.39 mg/dL — ABNORMAL LOW (ref 0.61–1.24)
GFR, Estimated: >60 mL/min (ref >60)
Glucose, Bld: 121 mg/dL — ABNORMAL HIGH (ref 70–99)
Potassium: 3.6 mmol/L (ref 3.5–5.1)
Sodium: 133 mmol/L — ABNORMAL LOW (ref 135–145)

## 2024-03-04 LAB — MAGNESIUM: Magnesium: 1.6 mg/dL — ABNORMAL LOW (ref 1.7–2.4)

## 2024-03-05 DIAGNOSIS — G931 Anoxic brain damage, not elsewhere classified: Secondary | ICD-10-CM

## 2024-03-05 DIAGNOSIS — Y95 Nosocomial condition: Secondary | ICD-10-CM

## 2024-03-05 DIAGNOSIS — Z930 Tracheostomy status: Secondary | ICD-10-CM

## 2024-03-05 DIAGNOSIS — J189 Pneumonia, unspecified organism: Secondary | ICD-10-CM

## 2024-03-05 DIAGNOSIS — J9621 Acute and chronic respiratory failure with hypoxia: Secondary | ICD-10-CM

## 2024-03-05 DIAGNOSIS — R6520 Severe sepsis without septic shock: Secondary | ICD-10-CM

## 2024-03-05 LAB — MAGNESIUM: Magnesium: 1.8 mg/dL (ref 1.7–2.4)

## 2024-03-06 DIAGNOSIS — R6520 Severe sepsis without septic shock: Secondary | ICD-10-CM

## 2024-03-06 DIAGNOSIS — Z930 Tracheostomy status: Secondary | ICD-10-CM

## 2024-03-06 DIAGNOSIS — J9621 Acute and chronic respiratory failure with hypoxia: Secondary | ICD-10-CM

## 2024-03-06 DIAGNOSIS — Y95 Nosocomial condition: Secondary | ICD-10-CM

## 2024-03-06 DIAGNOSIS — J189 Pneumonia, unspecified organism: Secondary | ICD-10-CM

## 2024-03-06 DIAGNOSIS — G931 Anoxic brain damage, not elsewhere classified: Secondary | ICD-10-CM

## 2024-03-06 NOTE — Consult Note (Signed)
 Chief Complaint: Patient was seen in consultation today for ventilator dependence, with consideration for percutaneous gastrostomy tube placement.  Referring Provider(s): Dr. Wayne Haines, MD [facility])  Supervising Physician: Fernando Hoyer  Patient Status: [facility] - In-pt  Patient is Full Code  History of Present Illness: Jason Rojas is a 63 y.o. male  with PMHx notable for metastatic prostate cancer, HTN, COPD, OSA, chronic dorsalgia, and depression.  Per Ms. Vannoy-Lankford, NP 's progress note in [facility] on 6/5: "Patient was admitted at [facility] from 01/24/2024 to 02/21/2024. Presented to [facility] after a fall that resulted in severe left chest wall pain. . ED workup were significant for L Rib FX 3-10, hemopneumothorax, and pulmonary contusion. EtOH level <15. He does have a history of daily alcohol  use.   Patient was transferred to [facility]. He reported a heavy history of alcohol  use.  He reported that he drinks 15 cans of beer daily and was drinking when the fall occurred..  Patient subsequently required Precedex  due to alcohol  withdrawal symptoms.  Patient has subsequently developed acute respiratory failure, was intubated 01/31/2024 and eventually required tracheostomy 02/09/2024.  Also had NG tube placed.  Hospitalization was complicated by Streptococcus pneumoniae pneumonia treated with Rocephin .  Repeat chest x-ray 02/21/2024 showed left lung opacity.  Sputum culture obtained 02/20/2024  with GNR empirically started on cefepime .  Blood culture with S epidermidis suspected contaminant."   Interventional Radiology was requested for percutaneous gastrostomy tube placement. Request was reviewed and approved by Dr. Marne Sings. Patient will be tentatively scheduled for same in IR on Monday 6/9, IR schedule permitting.   Patient is alert and laying in bed, calm. Mother and cousin at bedside. Patient is non-verbal secondary to  vented status with tracheostomy in place. He does repeatedly point to his NG tube. It appears to be bothering him. Unable to obtain ROS.    Past Medical History:  Diagnosis Date   Cancer [facility])    prostate   Chronic back pain    COPD (chronic obstructive pulmonary disease) (HCC)    Depression    Hypertension    PTSD (post-traumatic stress disorder)    Sleep apnea     Past Surgical History:  Procedure Laterality Date   APPENDECTOMY     BIOPSY  03/23/2019   Procedure: BIOPSY;  Surgeon: Suzette Espy, MD;  Location: [facility];  Service: Endoscopy;;  gastric   COLONOSCOPY     2010 and 2015, adenomas   COLONOSCOPY WITH PROPOFOL  N/A 03/23/2019   Procedure: COLONOSCOPY WITH PROPOFOL ;  Surgeon: Suzette Espy, MD;  Location: [facility];  Service: Endoscopy;  Laterality: N/A;  1:30pm   ESOPHAGOGASTRODUODENOSCOPY (EGD) WITH PROPOFOL  N/A 03/23/2019   Procedure: ESOPHAGOGASTRODUODENOSCOPY (EGD) WITH PROPOFOL ;  Surgeon: Suzette Espy, MD;  Location: [facility];  Service: Endoscopy;  Laterality: N/A;   IR THORACENTESIS ASP PLEURAL SPACE W/IMG GUIDE  02/27/2024   POLYPECTOMY  03/23/2019   Procedure: POLYPECTOMY;  Surgeon: Suzette Espy, MD;  Location: [facility];  Service: Endoscopy;;  colon   TONSILLECTOMY     TRACHEOSTOMY TUBE PLACEMENT N/A 02/10/2024   Procedure: CREATION, TRACHEOSTOMY;  Surgeon: Anda Bamberg, MD;  Location: [facility] OR;  Service: General;  Laterality: N/A;    Allergies: Tuberculin purified protein derivative  Medications: Prior to Admission medications   Medication Sig Start Date End Date Taking? Authorizing Provider  abiraterone acetate (ZYTIGA) 250 MG tablet Take 1,000 mg by  mouth daily. Take on an empty stomach 1 hour before or 2 hours after a meal    [provider]  acetaminophen  (TYLENOL ) 500 MG tablet Take 1,000 mg by mouth every 6 (six) hours as needed for moderate pain (pain score 4-6) or mild pain (pain score 1-3).    [provider]  albuterol  (VENTOLIN  HFA) 108 (90 Base) MCG/ACT inhaler Inhale 2 puffs into the lungs every 4 (four) hours as needed for wheezing or shortness of breath. 10/17/23   [provider]  amLODipine  (NORVASC ) 5 MG tablet Take 5 mg by mouth daily.     [provider]  bisacodyl  (DULCOLAX) 10 MG suppository Place 1 suppository (10 mg total) rectally daily as needed for moderate constipation. 02/21/24   Anda Bamberg, MD  carvedilol  (COREG ) 25 MG tablet Take 12.5 mg by mouth 2 (two) times daily with a meal.    [provider]  ceFEPIme  2 g in sodium chloride  0.9 % 100 mL Inject 2 g into the vein every 8 (eight) hours. 02/21/24   Anda Bamberg, MD  diazepam  (VALIUM ) 5 MG tablet Place 1 tablet (5 mg total) into feeding tube every 6 (six) hours. 02/21/24   Anda Bamberg, MD  docusate (COLACE) 50 MG/5ML liquid Place 10 mLs (100 mg total) into feeding tube 2 (two) times daily. 02/21/24   Anda Bamberg, MD  dorzolamide-timolol (COSOPT) 2-0.5 % ophthalmic solution Place 2 drops into the right eye daily. 11/05/23   [provider]  enoxaparin  (LOVENOX ) 30 MG/0.3ML injection Inject 0.3 mLs (30 mg total) into the skin every 12 (twelve) hours. 02/21/24   Anda Bamberg, MD  folic acid  (FOLVITE ) 1 MG tablet Place 1 tablet (1 mg total) into feeding tube daily. 02/22/24   Anda Bamberg, MD  gabapentin (NEURONTIN) 800 MG tablet Take 800 mg by mouth 3 (three) times daily as needed (for pain/nerves).     [provider]  guaiFENesin  (ROBITUSSIN) 100 MG/5ML liquid Place 15 mLs into feeding tube every 6 (six) hours. 02/21/24   Anda Bamberg, MD  insulin  aspart (NOVOLOG ) 100 UNIT/ML injection Inject 0-15 Units into the skin every 4 (four) hours. 02/21/24   Anda Bamberg, MD  ipratropium-albuterol  (DUONEB) 0.5-2.5 (3) MG/3ML SOLN Take 3 mLs by nebulization 3 (three) times daily. 02/21/24   Anda Bamberg, MD  lidocaine  (LIDODERM ) 5 % Place 2 patches onto the  skin daily. Remove & Discard patch within 12 hours or as directed by MD 02/21/24   Anda Bamberg, MD  metoCLOPramide  (REGLAN ) 5 MG/ML injection Inject 2 mLs (10 mg total) into the vein every 8 (eight) hours. 02/21/24   Anda Bamberg, MD  midazolam  (VERSED ) 2 MG/2ML SOLN injection Inject 1-2 mLs (1-2 mg total) into the vein every hour as needed for agitation. 02/21/24   Anda Bamberg, MD  Morphine  Sulfate (MORPHINE , PF,) 2 MG/ML injection Inject 1-2 mLs (2-4 mg total) into the vein every 4 (four) hours as needed (2mg  for moderate pain, 4mg  for severe pain; when taking PO please use oral meds preferentially). 02/21/24   Anda Bamberg, MD  Multiple Vitamin (MULTIVITAMIN WITH MINERALS) TABS tablet Place 1 tablet into feeding tube daily. 02/22/24   Anda Bamberg, MD  Nutritional Supplements (FEEDING SUPPLEMENT, VITAL 1.5 CAL,) LIQD Place 1,000 mLs into feeding tube continuous. 02/21/24   Anda Bamberg, MD  ondansetron  (ZOFRAN ) 4 MG/2ML SOLN injection Inject 2 mLs (4 mg total) into the  vein every 6 (six) hours as needed for nausea. 02/21/24   Anda Bamberg, MD  ondansetron  (ZOFRAN -ODT) 4 MG disintegrating tablet Take 1 tablet (4 mg total) by mouth every 6 (six) hours as needed for nausea. 02/21/24   Anda Bamberg, MD  oxyCODONE  (OXY IR/ROXICODONE ) 5 MG immediate release tablet Place 1-2 tablets (5-10 mg total) into feeding tube every 4 (four) hours as needed for moderate pain (pain score 4-6) or severe pain (pain score 7-10) (5mg  for moderate pain, 10mg  for severe pain). 02/21/24   Anda Bamberg, MD  polyethylene glycol (MIRALAX  / GLYCOLAX ) 17 g packet Place 17 g into feeding tube daily as needed (constipation). 02/21/24   Anda Bamberg, MD  polyethylene glycol (MIRALAX  / GLYCOLAX ) 17 g packet Place 17 g into feeding tube daily. 02/22/24   Anda Bamberg, MD  predniSONE  (DELTASONE ) 5 MG tablet Take 5 mg by mouth daily with breakfast.    [provider]  Protein (FEEDING  SUPPLEMENT, PROSOURCE TF20,) liquid Place 60 mLs into feeding tube 2 (two) times daily. 02/21/24   Anda Bamberg, MD  QUEtiapine  (SEROQUEL ) 50 MG tablet Place 1 tablet (50 mg total) into feeding tube 2 (two) times daily. 02/21/24   Anda Bamberg, MD  sildenafil (VIAGRA) 100 MG tablet Take 100 mg by mouth as needed for erectile dysfunction. 01/06/24   [provider]  valproate 750 mg in dextrose  5 % 50 mL Inject 750 mg into the vein every 8 (eight) hours. 02/21/24   Anda Bamberg, MD     Family History  Problem Relation Age of Onset   Colon cancer Neg Hx     Social History   Socioeconomic History   Marital status: Divorced    Spouse name: Not on file   Number of children: Not on file   Years of education: Not on file   Highest education level: Not on file  Occupational History   Not on file  Tobacco Use   Smoking status: Every Day    Current packs/day: 1.00    Types: Cigarettes   Smokeless tobacco: Never  Vaping Use   Vaping status: Never Used  Substance and Sexual Activity   Alcohol  use: Yes    Alcohol /week: 35.0 standard drinks of alcohol     Types: 35 Cans of beer per week    Comment: 5 beers daily   Drug use: Not Currently    Types: Cocaine    Comment: history, none in 3-4 years    Sexual activity: Never  Other Topics Concern   Not on file  Social History Narrative   Not on file   Social Drivers of Health   Financial Resource Strain: Low Risk  (02/25/2024)   Received from [facility]   Overall Financial Resource Strain (CARDIA)    Difficulty of Paying Living Expenses: Not hard at all  Food Insecurity: No Food Insecurity (02/25/2024)   Received from [facility]   Hunger Vital Sign    Worried About Running Out of Food in the Last Year: Never true    Ran Out of Food in the Last Year: Never true  Transportation Needs: Patient Unable To Answer (02/21/2024)   Received from [facility]   [facility] SDOH Transportation Source    Has lack of transportation  kept you from medical appointments or from getting medications?: Unable to respond    Has lack of transportation kept you from meetings, work, or from getting things needed for daily living?: Unable  to respond  Physical Activity: Not on file  Stress: Patient Unable To Answer (02/21/2024)   Received from [facility]   [facility] - Occupational Stress Questionnaire    Feeling of Stress : Patient unable to answer  Social Connections: Socially Isolated (02/25/2024)   Received from [facility]   Social Connection and Isolation Panel [NHANES]    Frequency of Communication with Friends and Family: Twice a week    Frequency of Social Gatherings with Friends and Family: Once a week    Attends Religious Services: Never    Database administrator or Organizations: No    Attends Engineer, structural: Never    Marital Status: Divorced     Review of Systems: A 12 point ROS discussed and pertinent positives are indicated in the HPI above.  All other systems are negative.  Vital Signs: On 03/06/24: BP 150/75, Pulse 87, Respirations 25, O2 95%, Temp 98, Height 5'6", Weight 184 lbs.  Advance Care Plan: The advanced care place/surrogate decision maker was discussed at the time of visit and the patient did not wish to discuss or was not able to name a surrogate decision maker or provide an advance care plan.  Physical Exam Vitals reviewed.  Constitutional:      General: He is not in acute distress.    Appearance: Normal appearance.     Comments: Patient non-verbal due to tracheostomy status, on vent. Patient can communicate with hand gestures and head nods.  HENT:     Mouth/Throat:     Mouth: Mucous membranes are dry.  Cardiovascular:     Rate and Rhythm: Normal rate and regular rhythm.     Pulses: Normal pulses.     Heart sounds: No murmur heard. Pulmonary:     Breath sounds: Wheezing and rales present.     Comments: Patient with tracheostomy,  vented. Abdominal:     General: Abdomen is flat. There is no distension.     Tenderness: There is no abdominal tenderness.  Musculoskeletal:     Cervical back: Normal range of motion.     Comments: Patient is bed-bound.  Skin:    General: Skin is warm and dry.  Neurological:     Mental Status: He is alert.     Comments: Patient is non-verbal. Unable to assess.  Psychiatric:     Comments: Patient is non-verbal. Unable to assess.     Imaging: DG Abd Portable 1V Result Date: 03/04/2024 CLINICAL DATA:  Abdominal distension. EXAM: PORTABLE ABDOMEN - 1 VIEW COMPARISON:  02/24/2024 FINDINGS: Enteric tube is again noted with tip in the distal stomach. Bowel gas pattern is nonspecific with persistent air-filled loops of small and large bowel. No pathologic dilatation of the small bowel loops noted. IMPRESSION: 1. Enteric tube tip is in the distal stomach. 2. Nonspecific bowel gas pattern is similar to previous exam. Electronically Signed   By: Kimberley Penman M.D.   On: 03/04/2024 06:59   DG Chest 1 View Result Date: 03/04/2024 CLINICAL DATA:  Shortness of breath EXAM: CHEST  1 VIEW COMPARISON:  Chest x-ray performed Feb 27, 2024 FINDINGS: Low lung volumes, but improved when compared to May 29. Tracheostomy appliance. Enteric tube course below the diaphragm. Blunting of both costophrenic angles is similar. IMPRESSION: 1. Low lung volumes, but improved when compared to Feb 27, 2024. 2. Possible trace bilateral pleural effusions. 3. Similar appearance of left rib deformities. Electronically Signed   By: Reagan Camera M.D.   On:  03/04/2024 06:58   VAS US  UPPER EXTREMITY VENOUS DUPLEX Result Date: 02/27/2024 UPPER VENOUS STUDY  Patient Name:  Jason Rojas  Date of Exam:   02/27/2024 Medical Rec #: [id number]     Accession #:    [id number] Date of Birth: 1961-07-20     Patient Gender: M Patient Age:   61 years Exam Location:  [facility] Procedure:      VAS US  UPPER EXTREMITY VENOUS DUPLEX Referring  Phys: Derril Flint --------------------------------------------------------------------------------  Indications: Swelling Risk Factors: Status post fall on stairs ETOH abuse. Limitations: Trach collar, subcutaneous edema throughout left upper extremity. Comparison Study: No prior study on file Performing Technologist: Carleene Chase RVS  Examination Guidelines: A complete evaluation includes B-mode imaging, spectral Doppler, color Doppler, and power Doppler as needed of all accessible portions of each vessel. Bilateral testing is considered an integral part of a complete examination. Limited examinations for reoccurring indications may be performed as noted.  Right Findings: +----------+------------+---------+-----------+----------+-------+ RIGHT     CompressiblePhasicitySpontaneousPropertiesSummary +----------+------------+---------+-----------+----------+-------+ Subclavian    Full       Yes       Yes                      +----------+------------+---------+-----------+----------+-------+  Left Findings: +----------+------------+---------+-----------+----------+--------------+ LEFT      CompressiblePhasicitySpontaneousProperties   Summary     +----------+------------+---------+-----------+----------+--------------+ IJV                                                 Not visualized +----------+------------+---------+-----------+----------+--------------+ Subclavian    Full       Yes       Yes                             +----------+------------+---------+-----------+----------+--------------+ Axillary                 Yes       Yes                             +----------+------------+---------+-----------+----------+--------------+ Brachial                 Yes       Yes                             +----------+------------+---------+-----------+----------+--------------+ Radial        Full                                                  +----------+------------+---------+-----------+----------+--------------+ Ulnar         Full                                                 +----------+------------+---------+-----------+----------+--------------+ Cephalic      Full                                                 +----------+------------+---------+-----------+----------+--------------+  Basilic       Full                                                 +----------+------------+---------+-----------+----------+--------------+  Summary:  Right: No evidence of thrombosis in the subclavian.  Left: No evidence of deep vein thrombosis in the visualized veins of the upper extremity. No evidence of superficial vein thrombosis in the upper extremity.  *See table(s) above for measurements and observations.  Diagnosing physician: Runell Countryman Electronically signed by Runell Countryman on 02/27/2024 at 6:18:47 PM.    Final    IR THORACENTESIS ASP PLEURAL SPACE W/IMG GUIDE Result Date: 02/27/2024 INDICATION: Pt in the hospital with respiratory failure s/p traumatic fall on 4/25. Initially with hemopneumothorax and since [facility], now with recurrent left pleural effusion IR consulted for therapeutic thoracentesis. EXAM: ULTRASOUND GUIDED THERAPEUTIC LEFT THORACENTESIS MEDICATIONS: 8 mL 1% lidocaine  COMPLICATIONS: None immediate. PROCEDURE: An ultrasound guided thoracentesis was thoroughly discussed with the patient and questions answered. The benefits, risks, alternatives and complications were also discussed. The patient understands and wishes to proceed with the procedure. Written consent was obtained. Ultrasound was performed to localize and mark an adequate pocket of fluid in the left chest. The area was then prepped and draped in the normal sterile fashion. 1% Lidocaine  was used for local anesthesia. Under ultrasound guidance a 6 Fr Safe-T-Centesis catheter was introduced. Thoracentesis was  performed. The catheter was removed and a dressing applied. FINDINGS: A total of approximately 550 mL of blood tinged fluid was removed. IMPRESSION: Successful ultrasound guided left thoracentesis yielding 550 mL of pleural fluid. Performed and dictated by Ashley Blades, PA-C Electronically Signed   By: Creasie Doctor M.D.   On: 02/27/2024 12:28   DG Chest Port 1 View Result Date: 02/27/2024 CLINICAL DATA:  Status post left thoracentesis. EXAM: PORTABLE CHEST 1 VIEW COMPARISON:  02/22/2024 FINDINGS: Tracheostomy tube again noted. The NG tube passes into the stomach although the distal tip position is not included on the film. The patient's right-sided PICC line tip has migrated in the interval and is no longer central but rather the catheter tracks cranially in the right neck before looping back on itself with the tip positioned in the right supraclavicular region. PICC line tip may be looped in the right internal jugular vein or tracking out into a superficial right neck vein. Interval decrease in left pleural effusion. No evidence for left-sided pneumothorax. Persistent left base atelectasis or infiltrate with tiny left pleural effusion again noted. IMPRESSION: 1. Interval decrease in left pleural effusion without evidence for left-sided pneumothorax. 2. Interval migration of the patient's right-sided PICC line tip which is no longer central but rather the catheter tracks cranially in the right neck before looping back on itself with the tip positioned in the right supraclavicular region. PICC line tip may be looped in the right internal jugular vein or tracking out into a superficial right neck vein. These results will be called to the ordering clinician or representative by the Radiologist Assistant, and communication documented in the PACS or [employer]. Electronically Signed   By: Donnal Fusi M.D.   On: 02/27/2024 12:17   DG Abd Portable 1V Result Date: 02/24/2024 EXAM: 1 VIEW XRAY OF THE ABDOMEN  SUPINE 02/24/2024 02:05:00 PM COMPARISON: 1 view abdomen 02/21/2024. CLINICAL HISTORY: Encounter for nasogastric (  NG) tube placement. FINDINGS: BOWEL: The bowel gas pattern is nonspecific. No bowel obstruction. PERITONEUM AND SOFT TISSUES: No abnormal calcifications. Left pleural effusion basilar airspace disease is somewhat improved. BONES: No acute osseous abnormality. LINES AND TUBES: The side port of the nasogastric tube is in the fundus of the stomach. IMPRESSION: 1. Nasogastric tube in appropriate position with side port in the fundus of the stomach. 2. Left pleural effusion and basilar airspace disease, somewhat improved. Electronically signed by: Audree Leas MD 02/24/2024 02:16 PM EDT RP Workstation: [id number]   CT CHEST ABDOMEN PELVIS W CONTRAST Result Date: 02/22/2024 CLINICAL DATA:  Hematemesis, respiratory distress EXAM: CT CHEST, ABDOMEN, AND PELVIS WITH CONTRAST TECHNIQUE: Multidetector CT imaging of the chest, abdomen and pelvis was performed following the standard protocol during bolus administration of intravenous contrast. RADIATION DOSE REDUCTION: This exam was performed according to the departmental dose-optimization program which includes automated exposure control, adjustment of the mA and/or kV according to patient size and/or use of iterative reconstruction technique. CONTRAST:  75mL OMNIPAQUE  IOHEXOL  350 MG/ML SOLN COMPARISON:  Same day chest radiograph; CT chest abdomen pelvis 02/11/2024 FINDINGS: CT CHEST FINDINGS Cardiovascular: No pericardial effusion. Aortic atherosclerotic calcification. Right PICC tip in the low SVC. Mediastinum/Nodes: Tracheostomy tip in the intrathoracic trachea. Enteric tube tip in the stomach. Fluid in the esophagus. No thoracic adenopathy. Lungs/Pleura: Large left pleural effusion. Complete atelectasis of the bilateral lower lobes. Respiratory motion obscures detail in the aerated lungs. Paraseptal emphysema in the right upper lobe. No pneumothorax.  Musculoskeletal: Unchanged fractures of the left 2nd-10th ribs. Many consecutive ribs are fractured in more than 1 location. CT ABDOMEN PELVIS FINDINGS Hepatobiliary: No acute abnormality. Pancreas: Unremarkable. Spleen: Unremarkable. Adrenals/Urinary Tract: Unremarkable adrenal glands. No urinary calculi or hydronephrosis. Unremarkable bladder. Stomach/Bowel: Normal caliber large and small bowel. No bowel wall thickening. Stomach is within normal limits. Vascular/Lymphatic: Aortic atherosclerosis. No enlarged abdominal or pelvic lymph nodes. Reproductive: Unremarkable. Other: No free intraperitoneal fluid or air. Musculoskeletal: No acute fracture. Edema within the subcutaneous fat of both flanks. IMPRESSION: 1. Large left pleural effusion similar to prior. 2. Complete atelectasis of the bilateral lower lobes, increased on the right compared to 02/11/2024. 3. The esophagus is distended with fluid. 4. Unchanged fractures of the left 2nd-10th ribs. 5. No acute abnormality in the abdomen or pelvis. Aortic Atherosclerosis (ICD10-I70.0) and Emphysema (ICD10-J43.9). Electronically Signed   By: Rozell Cornet M.D.   On: 02/22/2024 22:46   DG CHEST PORT 1 VIEW Result Date: 02/22/2024 CLINICAL DATA:  PICC placement EXAM: PORTABLE CHEST 1 VIEW COMPARISON:  X-ray 02/21/2024 FINDINGS: Stable tracheostomy tube and enteric tube. Right-sided PICC in place with tip seen as far as the central SVC. This has been retracted from previous. Film is rotated to left. Underinflation. Enlarged cardiopericardial silhouette with persistent lung base opacities and effusion. Vascular congestion. No pneumothorax. Overlapping cardiac leads. IMPRESSION: Apparent retraction of the right-sided PICC with tip seen as far as the central SVC above the right atrium. Electronically Signed   By: Adrianna Horde M.D.   On: 02/22/2024 15:30   ECHOCARDIOGRAM COMPLETE Result Date: 02/22/2024    ECHOCARDIOGRAM REPORT   Patient Name:   Jason Rojas Date of  Exam: 02/22/2024 Medical Rec #:  [id number]    Height:       66.0 in Accession #:    [id number]   Weight:       232.8 lb Date of Birth:  16-Mar-1961    BSA:  2.133 m Patient Age:    62 years     BP:           135/71 mmHg Patient Gender: M            HR:           101 bpm. Exam Location:  Inpatient Procedure: 2D Echo, Color Doppler and Cardiac Doppler (Both Spectral and Color            Flow Doppler were utilized during procedure). Indications:     Ventricular Tachycardia  History:         Patient has no prior history of Echocardiogram examinations.                  COPD; Risk Factors:Hypertension, Sleep Apnea and Current                  Smoker.  Sonographer:     Travis Friedman RDCS Referring Phys:  [id number] Golda Latch NSUMANGANYI Diagnosing Phys: Pasqual Bone MD IMPRESSIONS  1. Left ventricular ejection fraction, by estimation, is 70 to 75%. The left ventricle has hyperdynamic function. The left ventricle has no regional wall motion abnormalities. There is mild concentric left ventricular hypertrophy. Left ventricular diastolic parameters are consistent with Grade I diastolic dysfunction (impaired relaxation).  2. Right ventricular systolic function is normal. The right ventricular size is normal.  3. Left atrial size was mildly dilated.  4. Right atrial size was mildly dilated.  5. The mitral valve is grossly normal. Trivial mitral valve regurgitation.  6. The aortic valve is tricuspid. There is mild calcification of the aortic valve. There is mild thickening of the aortic valve. Aortic valve regurgitation is trivial.  7. The inferior vena cava is normal in size with greater than 50% respiratory variability, suggesting right atrial pressure of 3 mmHg. FINDINGS  Left Ventricle: Left ventricular ejection fraction, by estimation, is 70 to 75%. The left ventricle has hyperdynamic function. The left ventricle has no regional wall motion abnormalities. The left ventricular internal cavity size was normal in size.  There is mild concentric left ventricular hypertrophy. Left ventricular diastolic parameters are consistent with Grade I diastolic dysfunction (impaired relaxation). Right Ventricle: The right ventricular size is normal. No increase in right ventricular wall thickness. Right ventricular systolic function is normal. Left Atrium: Left atrial size was mildly dilated. Right Atrium: Right atrial size was mildly dilated. Pericardium: There is no evidence of pericardial effusion. Mitral Valve: The mitral valve is grossly normal. Trivial mitral valve regurgitation. Tricuspid Valve: The tricuspid valve is normal in structure. Tricuspid valve regurgitation is trivial. Aortic Valve: The aortic valve is tricuspid. There is mild calcification of the aortic valve. There is mild thickening of the aortic valve. There is mild aortic valve annular calcification. Aortic valve regurgitation is trivial. Aortic valve mean gradient measures 6.0 mmHg. Aortic valve peak gradient measures 11.2 mmHg. Aortic valve area, by VTI measures 3.20 cm. Pulmonic Valve: The pulmonic valve was normal in structure. Pulmonic valve regurgitation is not visualized. Aorta: The aortic root is normal in size and structure. There is minimal (Grade I) atheroma plaque involving the aortic root and ascending aorta. Venous: The inferior vena cava is normal in size with greater than 50% respiratory variability, suggesting right atrial pressure of 3 mmHg. IAS/Shunts: The atrial septum is grossly normal.  LEFT VENTRICLE PLAX 2D LVIDd:         5.10 cm      Diastology LVIDs:  3.00 cm      LV e' medial:    8.38 cm/s LV PW:         1.30 cm      LV E/e' medial:  9.6 LV IVS:        1.20 cm      LV e' lateral:   10.90 cm/s LVOT diam:     2.20 cm      LV E/e' lateral: 7.4 LV SV:         71 LV SV Index:   33 LVOT Area:     3.80 cm  LV Volumes (MOD) LV vol d, MOD A2C: 118.0 ml LV vol d, MOD A4C: 128.0 ml LV vol s, MOD A2C: 35.3 ml LV vol s, MOD A4C: 29.0 ml LV SV MOD  A2C:     82.7 ml LV SV MOD A4C:     128.0 ml LV SV MOD BP:      91.0 ml RIGHT VENTRICLE RV Basal diam:  3.80 cm RV S prime:     20.00 cm/s TAPSE (M-mode): 2.2 cm LEFT ATRIUM             Index        RIGHT ATRIUM           Index LA diam:        3.50 cm 1.64 cm/m   RA Area:     14.50 cm LA Vol (A2C):   69.3 ml 32.49 ml/m  RA Volume:   32.20 ml  15.10 ml/m LA Vol (A4C):   67.7 ml 31.74 ml/m LA Biplane Vol: 76.1 ml 35.67 ml/m  AORTIC VALVE AV Area (Vmax):    2.89 cm AV Area (Vmean):   2.92 cm AV Area (VTI):     3.20 cm AV Vmax:           167.00 cm/s AV Vmean:          111.000 cm/s AV VTI:            0.221 m AV Peak Grad:      11.2 mmHg AV Mean Grad:      6.0 mmHg LVOT Vmax:         127.00 cm/s LVOT Vmean:        85.400 cm/s LVOT VTI:          0.186 m LVOT/AV VTI ratio: 0.84  AORTA Ao Root diam: 4.00 cm Ao Asc diam:  4.20 cm MITRAL VALVE MV Area (PHT): 4.21 cm     SHUNTS MV Decel Time: 180 msec     Systemic VTI:  0.19 m MV E velocity: 80.80 cm/s   Systemic Diam: 2.20 cm MV A velocity: 105.00 cm/s MV E/A ratio:  0.77 Pasqual Bone MD Electronically signed by Pasqual Bone MD Signature Date/Time: 02/22/2024/9:40:42 AM    Final    DG Abd Portable 1V Result Date: 02/21/2024 CLINICAL DATA:  Nasogastric tube placement EXAM: PORTABLE ABDOMEN - 1 VIEW COMPARISON:  02/17/2024 FINDINGS: Nasogastric tube tip is in the stomach body. Borderline dilated loops of small bowel in the upper abdomen. Gas is also present in the colon. Retrocardiac airspace opacity. IMPRESSION: 1. Nasogastric tube tip is in the stomach body. 2. Borderline dilated loops of small bowel in the upper abdomen. Gas is also present in the colon. 3. Retrocardiac airspace opacity. Electronically Signed   By: Freida Jes M.D.   On: 02/21/2024 17:17   DG CHEST PORT 1 VIEW Result Date: 02/21/2024 CLINICAL DATA:  Tracheostomy  tube status EXAM: PORTABLE CHEST 1 VIEW COMPARISON:  02/20/2024 FINDINGS: Tracheostomy tube projects over the tracheal air  shadow, satisfactorily positioned. Right PICC line tip: Right atrium. If cavoatrial junction positioning is desired calmer retract 3 cm. A nasogastric or orogastric tube extends into the stomach. Low lung volumes are present, causing crowding of the pulmonary vasculature. Mild enlargement of the cardiopericardial silhouette. Improved aeration at the right lung base. Continued retrocardiac airspace opacity with thickening of the left pleural margin favoring pleural effusion. Multiple left rib discontinuities compatible with fractures observed. Mildly improved aeration in the left upper lobe. IMPRESSION: 1. Tracheostomy tube projects over the tracheal air shadow, satisfactorily positioned. 2. Right PICC line tip: Right atrium. If cavoatrial junction positioning is desired, retract 3 cm. 3. Mildly improved aeration in the left upper lobe and right lung base. 4. Continued retrocardiac airspace opacity with thickening of the left pleural margin favoring pleural effusion. 5. Multiple left rib discontinuities compatible with fractures. Electronically Signed   By: Freida Jes M.D.   On: 02/21/2024 17:13   DG CHEST PORT 1 VIEW Result Date: 02/20/2024 CLINICAL DATA:  Ventilator dependent.  Tracheostomy. EXAM: PORTABLE CHEST 1 VIEW COMPARISON:  Chest radiograph dated 02/17/2024. FINDINGS: Subcortical greatest in similar position. Shallow inspiration. There is cardiomegaly with vascular congestion and edema. Diffuse left lung hazy density progressed since the prior radiograph likely layering pleural effusion and associated atelectasis or worsening edema. Pneumonia is not excluded. No pneumothorax. No acute osseous pathology. IMPRESSION: 1. Cardiomegaly with vascular congestion and edema. 2. Progression of left lung hazy density likely layering pleural effusion and associated atelectasis or worsening edema. Electronically Signed   By: Angus Bark M.D.   On: 02/20/2024 09:30   DG Abd Portable 1V Result Date:  02/17/2024 CLINICAL DATA:  Follow-up small bowel and colonic ileus. EXAM: PORTABLE ABDOMEN - 1 VIEW COMPARISON:  02/16/2024 and chest, abdomen and pelvis CT dated 02/11/2024. FINDINGS: Nasogastric tube tip and side hole in the mid stomach. Mildly dilated bowel loops with some overall decrease in amount gas. Thoracolumbar spine degenerative changes. IMPRESSION: Mildly improved ileus with a nasogastric tube in place. Electronically Signed   By: Catherin Closs M.D.   On: 02/17/2024 12:39   DG CHEST PORT 1 VIEW Result Date: 02/17/2024 CLINICAL DATA:  [id number] Bilateral pleural effusion [id number] EXAM: PORTABLE CHEST 1 VIEW COMPARISON:  Feb 16, 2024, Feb 11, 2024 FINDINGS: The cardiomediastinal silhouette is unchanged and enlarged in contour.Tracheostomy. The enteric tube courses through the chest to the abdomen beyond the field-of-view. RIGHT upper extremity PICC tip terminates over the RIGHT atrium. Small bilateral pleural effusions, similar in comparison to most recent prior and slightly increased on the RIGHT in comparison to more remote priors. No pneumothorax. Persistent homogeneous opacification of the LEFT lung base. IMPRESSION: 1. Small bilateral pleural effusions, similar in comparison to most recent prior and slightly increased on the RIGHT in comparison to more remote priors. 2. Persistent homogeneous opacification of the LEFT lung base. Electronically Signed   By: Clancy Crimes M.D.   On: 02/17/2024 07:44   DG CHEST PORT 1 VIEW Result Date: 02/16/2024 CLINICAL DATA:  Increased oro pharyngeal secretions. EXAM: PORTABLE CHEST 1 VIEW COMPARISON:  02/14/2024 FINDINGS: Low volume film. The cardio pericardial silhouette is enlarged. There is pulmonary vascular congestion without overt pulmonary edema. Bibasilar atelectasis/infiltrate again noted with bilateral pleural effusions, left greater than right, similar to prior although potentially minimally progressive on the right. Multiple left rib fractures again  noted. No pneumothorax. Tracheostomy tube  remains in place. Right-sided PICC line tip overlies the low SVC near the junction with the RA. The NG tube passes into the stomach although the distal tip position is not included on the film. IMPRESSION: Low volume film with bibasilar atelectasis/infiltrate and bilateral pleural effusions, left greater than right. Findings are similar to prior although potentially minimally progressive on the right. Electronically Signed   By: Donnal Fusi M.D.   On: 02/16/2024 08:17   DG Abd Portable 1V Result Date: 02/16/2024 CLINICAL DATA:  Abdominal distension. Increasing oro pharyngeal secretions. EXAM: PORTABLE ABDOMEN - 1 VIEW COMPARISON:  02/13/2024. FINDINGS: NG tube tip overlies the mid stomach. Gaseous distension of the visualized abdominal colon is similar to prior. Gaseous distention of small bowel seen in the upper abdomen previously appears decreased in the interval. IMPRESSION: 1. NG tube tip overlies the mid stomach. 2. Persistent gaseous distension of the visualized abdominal colon. Electronically Signed   By: Donnal Fusi M.D.   On: 02/16/2024 08:16   DG CHEST PORT 1 VIEW Result Date: 02/14/2024 CLINICAL DATA:  [id number] Respiratory failure (HCC) [id number] EXAM: PORTABLE CHEST - 1 VIEW COMPARISON:  Feb 10, 2024, Feb 03, 2024 FINDINGS: Low lung volumes. Patchy opacities in the right lung base. Moderate layering left pleural effusion with associated atelectasis. Tracheostomy tube terminates in the upper trachea at the thoracic inlet. Right PICC terminates in the right atrium, similarly positioned. Esophagogastric tube terminates in the stomach. Mild cardiomegaly. Multiple displaced left-sided rib fractures. IMPRESSION: Cardiomegaly with findings of interstitial edema. Unchanged moderate left pleural effusion with associated atelectasis. Similar positioning of the support tubes and lines, as described above. Electronically Signed   By: Rance Burrows M.D.   On: 02/14/2024  09:21   DG Abd 1 View Result Date: 02/13/2024 CLINICAL DATA:  63 year old male NG tube placement. EXAM: ABDOMEN - 1 VIEW COMPARISON:  02/11/2024 and earlier. FINDINGS: Portable AP semi upright view at 0625 hours. Endotracheal tube terminates in the left upper quadrant, side hole at the level of the gastric body. Stable bowel gas pattern from the CT on 02/11/2024, nondilated but gas-filled small and large bowel loops throughout the abdomen. IMPRESSION: 1. Satisfactory enteric tube placement into the stomach. 2. Stable bowel gas pattern since the CT on 02/11/2024, suggesting ileus. Electronically Signed   By: Marlise Simpers M.D.   On: 02/13/2024 06:40   DG Abd 1 View Result Date: 02/11/2024 CLINICAL DATA:  Enteric catheter placement EXAM: ABDOMEN - 1 VIEW COMPARISON:  02/11/2024 FINDINGS: Frontal view of the lower chest and upper abdomen demonstrates enteric catheter passing below diaphragm, tip and side port projecting over the gastric antrum. Continued gaseous distention of the bowel unchanged. IMPRESSION: 1. Enteric catheter tip projecting over the gastric fundus. Electronically Signed   By: Bobbye Burrow M.D.   On: 02/11/2024 23:39   DG Abd Portable 1V Result Date: 02/11/2024 CLINICAL DATA:  Nasogastric tube placement EXAM: PORTABLE ABDOMEN - 1 VIEW COMPARISON:  Prior radiographs of the abdomen 02/02/2024 FINDINGS: Nasogastric tube is well positioned and overlies the expected location of the stomach. No evidence of bowel obstruction. Nonspecific left basilar airspace opacity partially imaged. Multiple left-sided rib fractures. IMPRESSION: 1. Well-positioned nasogastric tube. 2. Nonspecific left basilar airspace opacity may reflect pleural fluid with atelectasis and/or infiltrate. Electronically Signed   By: Fernando Hoyer M.D.   On: 02/11/2024 14:40   CT CHEST ABDOMEN PELVIS W CONTRAST Result Date: 02/11/2024 CLINICAL DATA:  Old blood in mouth. Altered mental status. Abdominal distension. Recent fall with  injury. New tracheostomy tube. History of prostate cancer. EXAM: CT CHEST, ABDOMEN, AND PELVIS WITH CONTRAST TECHNIQUE: Multidetector CT imaging of the chest, abdomen and pelvis was performed following the standard protocol during bolus administration of intravenous contrast. RADIATION DOSE REDUCTION: This exam was performed according to the departmental dose-optimization program which includes automated exposure control, adjustment of the mA and/or kV according to patient size and/or use of iterative reconstruction technique. CONTRAST:  75mL OMNIPAQUE  IOHEXOL  350 MG/ML SOLN COMPARISON:  Chest x-ray 02/10/2024. Abdominal x-ray 02/02/2024. CT scan 01/24/2024. FINDINGS: CT CHEST FINDINGS Cardiovascular: Borderline size heart. No significant pericardial effusion. Coronary artery calcifications are seen. Please correlate for other coronary risk factors. Right-sided PICC in place with tip extends to the SVC right atrial junction region. Diffuse breathing motion. Mediastinum/Nodes: Tracheostomy tube in place. Tip extends into the upper trachea. The tip is seen up against the back wall of the trachea in this location. Enteric tube in place with tip extending into the duodenum. The esophagus is distended with fluid. Question slight wall thickening. No specific abnormal lymph node enlargement identified in the axillary regions, hilum or mediastinum. Lungs/Pleura: On the prior examination there is a left-sided hydropneumothorax. The pneumothorax component is resolved. There is a large left pleural effusion which is increased from previous. There is opacification of the left lower lobe diffusely with volume loss. Favor more atelectasis. Dependent opacities in the left upper lobe as well, again favoring atelectasis. New trace right-sided pleural fluid with right lower lobe opacity. Again atelectasis favored over infiltrate but recommend follow-up. No right-sided pneumothorax. Few posterior right upper lobe subpleural blebs are  identified. No right-sided pneumothorax. Minimal opacity in the right upper lobe with ground-glass. Musculoskeletal: Diffuse degenerative changes along the spine. There is some old healed right-sided lower rib fractures. Multiple left-sided rib fractures are again seen. There are fractures involving the anterior and lateral aspect of the left second, third, fourth, fifth, sixth, seventh, eighth, ninth, tenth ribs. Is also a second fractures along several ribs posteriorly including the third, fourth, fifth, sixth, seventh, eighth, ninth ribs. With multiple ribs being fractured in more than 1 location. Correlate for any flail chest physiology. There are additional anterior fractures seen of several ribs as well on the left side. The left-sided chest wall gas has resolved in the interval since the prior CT scan. CT ABDOMEN PELVIS FINDINGS Hepatobiliary: No focal liver abnormality is seen. No gallstones, gallbladder wall thickening, or biliary dilatation. Patent portal vein. Pancreas: Unremarkable. No pancreatic ductal dilatation or surrounding inflammatory changes. Spleen: Normal in size without focal abnormality.  Small splenule Adrenals/Urinary Tract: Adrenal glands are preserved. No enhancing renal mass or collecting system dilatation. Lower pole Bosniak 1 right-sided renal cyst identified measuring 3.4 cm in diameter with Hounsfield unit less than 0. Bladder is contracted with a Foley catheter. The ureters have normal course and caliber extending down to the bladder. Stomach/Bowel: Stomach is distended with some fluid and air. Again tube in place with tip extending into the second portion of the duodenum. Colon is mildly distended with some fluid and air. No obstruction. The small bowel also has some scattered fluid and air. Vascular/Lymphatic: Aortic atherosclerosis. No enlarged abdominal or pelvic lymph nodes. Once again there is significant plaque along the SMA with an approximate 50% diameter stenosis.  Reproductive: Small prostate gland. Please correlate for history of neoplasm. Small seminal vesicles. Other: No free intra-air or free fluid. Mild nonspecific retroperitoneal stranding, new from previous. Musculoskeletal: Prominent degenerative changes along the lumbar spine with listhesis  at L4-5. Disc bulging suggested. Degenerative changes of the pelvis as well. IMPRESSION: Numerous tubes and lines identified. This includes tracheostomy tube with tip in the upper trachea. The tip of the tracheostomy tube is adjacent to the back wall of the trachea in this location. Right-sided PICC. Enteric tube. Foley catheter. Improved left-sided pneumothorax and left-sided chest wall gas. There is an increasing, now large left-sided pleural effusion. Complete collapse the left lower lobe. Left upper lobe presumed dependent atelectasis. Is also new right lower lobe opacity. Favoring atelectasis but recommend follow-up as there is a differential. Once again numerous left-sided acute rib fractures. Many ribs are fractured in more than 1 location, some or fracture in 3 locations. Please correlate for any focal chest physiology. Patulous fluid-filled esophagus. The stomach is also done with air and fluid. Enteric tube extends into the second portion of the duodenum. Scattered air and fluid along the small bowel and colon without dilatation. Electronically Signed   By: Adrianna Horde M.D.   On: 02/11/2024 12:45   CT HEAD WO CONTRAST ( ) Result Date: 02/11/2024 CLINICAL DATA:  Provided history: Altered mental status. EXAM: CT HEAD WITHOUT CONTRAST TECHNIQUE: Contiguous axial images were obtained from the base of the skull through the vertex without intravenous contrast. RADIATION DOSE REDUCTION: This exam was performed according to the departmental dose-optimization program which includes automated exposure control, adjustment of the mA and/or kV according to patient size and/or use of iterative reconstruction technique.  COMPARISON:  Head CT 01/24/2024. FINDINGS: Mildly motion degraded exam. Within this limitation, findings are as follows. Brain: Generalized cerebral atrophy. Patchy and ill-defined hypoattenuation within the cerebral white matter, nonspecific but compatible with mild chronic small vessel ischemic disease. There is no acute intracranial hemorrhage. No demarcated cortical infarct. No extra-axial fluid collection. No evidence of an intracranial mass. No midline shift. Vascular: No hyperdense vessel.  Atherosclerotic calcifications. Skull: No calvarial fracture or aggressive osseous lesion. Sinuses/Orbits: No mass or acute finding within the imaged orbits. Mild mucosal thickening within the right maxillary sinus. Other: Partially visualized nasoenteric tube. Right mastoid effusion. Trace fluid also present within the left mastoid air cells. IMPRESSION: 1. Mildly motion degraded exam. 2. No evidence of an acute intracranial abnormality. 3. Generalized cerebral atrophy, greater than expected for age. 4. Mild cerebral white matter chronic small vessel ischemic disease. 5. Mild right maxillary sinus mucosal thickening. 6. Right mastoid effusion. Trace fluid also present within left mastoid air cells. Electronically Signed   By: Bascom Lily D.O.   On: 02/11/2024 12:23   DG Chest Port 1 View Result Date: 02/10/2024 CLINICAL DATA:  Respiratory failure EXAM: PORTABLE CHEST 1 VIEW COMPARISON:  02/03/2024, 01/31/2024 FINDINGS: Interval tracheostomy tube with tip partially obscured by enteric catheter, suspect that the tip is about 5.4 cm superior to carina. Esophageal tube tip below the diaphragm but incompletely visualized. New right upper extremity central venous catheter tip at the right atrium. Cardiomegaly with vascular congestion and left greater than right pleural effusions. Probable pulmonary edema. Persistent consolidation at the left base. Multiple displaced left-sided rib fractures. IMPRESSION: 1. Interval  tracheostomy tube with tip partially obscured by enteric catheter, suspect that the tip is about 5.4 cm superior to carina. New right upper extremity central venous catheter tip at the right atrium. 2. Cardiomegaly with vascular congestion, probable mild edema, and left greater than right pleural effusions. Persistent consolidation at the left base. Electronically Signed   By: Esmeralda Hedge M.D.   On: 02/10/2024 17:17    Labs:  CBC: Recent Labs    02/24/24 1614 02/24/24 2137 02/29/24 0449 03/04/24 0329  WBC 8.1 8.6 6.9 10.8*  HGB 8.6* 8.5* 7.9* 8.4*  HCT 27.6* 27.3* 25.5* 26.9*  PLT 308 296 255 287    COAGS: No results for input(s): "INR", "APTT" in the last 8760 hours.  BMP: Recent Labs    02/25/24 0343 02/26/24 0120 02/26/24 1531 02/29/24 0449 03/04/24 0329  NA 145 146*  --  141 133*  K 2.6* 2.8* 3.5 3.7 3.6  CL 109 108  --  102 98  CO2 26 26  --  29 25  GLUCOSE 109* 94  --  114* 121*  BUN 17 12  --  13 10  CALCIUM 8.2* 8.2*  --  8.6* 8.8*  CREATININE 0.53* 0.49*  --  0.51* 0.39*  GFRNONAA >60 >60  --  >60 >60    LIVER FUNCTION TESTS: Recent Labs    02/17/24 0518 02/20/24 0114 02/21/24 1631 02/22/24 0410  BILITOT 0.3 0.4 0.4 0.6  AST 18 40 32 28  ALT 142* 108* 89* 77*  ALKPHOS 130* 156* 149* 145*  PROT 5.2* 5.8* 7.0 6.8  ALBUMIN  1.7* 2.3* 2.6* 2.6*    TUMOR MARKERS: No results for input(s): "AFPTM", "CEA", "CA199", "CHROMGRNA" in the last 8760 hours.  Assessment and Plan: Per Ms. Vannoy-Lankford, NP 's progress note in [facility] on 6/5: "Patient was admitted at [facility] from 01/24/2024 to 02/21/2024. Presented to [facility] after a fall that resulted in severe left chest wall pain. . ED workup were significant for L Rib FX 3-10, hemopneumothorax, and pulmonary contusion. EtOH level <15. He does have a history of daily alcohol  use.   Patient was transferred to [facility]. He reported a heavy history of alcohol  use.  He  reported that he drinks 15 cans of beer daily and was drinking when the fall occurred..  Patient subsequently required Precedex  due to alcohol  withdrawal symptoms.  Patient has subsequently developed acute respiratory failure, was intubated 01/31/2024 and eventually required tracheostomy 02/09/2024.  Also had NG tube placed.  Hospitalization was complicated by Streptococcus pneumoniae pneumonia treated with Rocephin .  Repeat chest x-ray 02/21/2024 showed left lung opacity.  Sputum culture obtained 02/20/2024  with GNR empirically started on cefepime .  Blood culture with S epidermidis suspected contaminant."  Patient will present for tentatively scheduled percutaneous G-tube placement in IR on Monday 6/9, IR schedule permitting.  Patient is NPO secondary to tracheostomy status on ventilator. Tube feeds will be discontinued at 8pm on 6/8. Lovenox  will be held on 6/8 and 6/9, and to be resumed on 6/10. All labs and medications are currenty within acceptable parameters.  No pertinent allergies.   Risks and benefits image guided gastrostomy tube placement was discussed with the patient including, but not limited to the need for a barium enema during the procedure, bleeding, infection, peritonitis and/or damage to adjacent structures.  All of the patient's questions were answered, patient is agreeable to proceed.  Consent signed and in chart.      Thank you for allowing our service to participate in Jason Rojas 's care.  Electronically Signed: Lovena Rubinstein, PA-C   03/06/2024, 3:18 PM      I spent a total of 40 Minutes in face to face in clinical consultation, greater than 50% of which was counseling/coordinating care for ventilator dependence, with consideration for percutaneous gastrostomy tube placement.

## 2024-03-06 NOTE — Progress Notes (Addendum)
  IR BRIEF NOTE:  IR is requested for gastrostomy tube placement, which was reviewed and approved by Dr. Marne Sings. At time of bedside visit, patient's mother and cousin were present. Patient was alert, and was able to adequately communicate through hand gestures and head nods. 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 Per family in room, patient's daughter is to give consent, as patient did not respond to several attempts at obtaining consent.   Patient's daughter is out of the country until Sunday 6/8, and unavailable by phone. Left voicemail. Will re-attempt consent Monday or early next week.   Interim: Patient's daughter returned the call, and consent was obtained by phone. Will attempt procedure on Monday. Patient is NPO due to tracheostomy status. Tube feeds through [facility] to be stopped at 8pm on 6/8 in anticipation of possible placement on Monday 6/9. Lovenox  to be held on 6/8, and may be restarted on 6/10.   Electronically Signed: Lovena Rubinstein, PA-C 03/06/2024, 1:20 PM

## 2024-03-07 DIAGNOSIS — Y95 Nosocomial condition: Secondary | ICD-10-CM

## 2024-03-07 DIAGNOSIS — J9621 Acute and chronic respiratory failure with hypoxia: Secondary | ICD-10-CM

## 2024-03-07 DIAGNOSIS — J189 Pneumonia, unspecified organism: Secondary | ICD-10-CM

## 2024-03-07 DIAGNOSIS — G931 Anoxic brain damage, not elsewhere classified: Secondary | ICD-10-CM

## 2024-03-07 DIAGNOSIS — Z930 Tracheostomy status: Secondary | ICD-10-CM

## 2024-03-07 DIAGNOSIS — R6520 Severe sepsis without septic shock: Secondary | ICD-10-CM

## 2024-03-08 DIAGNOSIS — Z930 Tracheostomy status: Secondary | ICD-10-CM

## 2024-03-08 DIAGNOSIS — G931 Anoxic brain damage, not elsewhere classified: Secondary | ICD-10-CM

## 2024-03-08 DIAGNOSIS — R6520 Severe sepsis without septic shock: Secondary | ICD-10-CM

## 2024-03-08 DIAGNOSIS — J189 Pneumonia, unspecified organism: Secondary | ICD-10-CM

## 2024-03-08 DIAGNOSIS — J9621 Acute and chronic respiratory failure with hypoxia: Secondary | ICD-10-CM

## 2024-03-08 DIAGNOSIS — Y95 Nosocomial condition: Secondary | ICD-10-CM

## 2024-03-08 LAB — BASIC METABOLIC PANEL WITH GFR
Anion gap: 11 (ref 5–15)
BUN: 10 mg/dL (ref 8–23)
CO2: 22 mmol/L (ref 22–32)
Calcium: 9.1 mg/dL (ref 8.9–10.3)
Chloride: 99 mmol/L (ref 98–111)
Creatinine, Ser: 0.44 mg/dL — ABNORMAL LOW (ref 0.61–1.24)
GFR, Estimated: >60 mL/min (ref >60)
Glucose, Bld: 127 mg/dL — ABNORMAL HIGH (ref 70–99)
Potassium: 4.1 mmol/L (ref 3.5–5.1)
Sodium: 132 mmol/L — ABNORMAL LOW (ref 135–145)

## 2024-03-08 LAB — CBC
HCT: 25.4 % — ABNORMAL LOW (ref 39.0–52.0)
Hemoglobin: 8.1 g/dL — ABNORMAL LOW (ref 13.0–17.0)
MCH: 24.9 pg — ABNORMAL LOW (ref 26.0–34.0)
MCHC: 31.9 g/dL (ref 30.0–36.0)
MCV: 78.2 fL — ABNORMAL LOW (ref 80.0–100.0)
Platelets: 335 10*3/uL (ref 150–400)
RBC: 3.25 MIL/uL — ABNORMAL LOW (ref 4.22–5.81)
RDW: 14.7 % (ref 11.5–15.5)
WBC: 7.8 10*3/uL (ref 4.0–10.5)
nRBC: 0 % (ref 0.0–0.2)

## 2024-03-08 LAB — MAGNESIUM: Magnesium: 1.7 mg/dL (ref 1.7–2.4)

## 2024-03-08 LAB — PROTIME-INR
INR: 1 (ref 0.8–1.2)
Prothrombin Time: 13.3 s (ref 11.4–15.2)

## 2024-03-09 ENCOUNTER — Institutional Professional Consult (permissible substitution) (HOSPITAL_COMMUNITY): Payer: Self-pay

## 2024-03-09 DIAGNOSIS — Y95 Nosocomial condition: Secondary | ICD-10-CM

## 2024-03-09 DIAGNOSIS — J189 Pneumonia, unspecified organism: Secondary | ICD-10-CM

## 2024-03-09 DIAGNOSIS — G931 Anoxic brain damage, not elsewhere classified: Secondary | ICD-10-CM

## 2024-03-09 DIAGNOSIS — R6520 Severe sepsis without septic shock: Secondary | ICD-10-CM

## 2024-03-09 DIAGNOSIS — Z930 Tracheostomy status: Secondary | ICD-10-CM

## 2024-03-09 DIAGNOSIS — J9621 Acute and chronic respiratory failure with hypoxia: Secondary | ICD-10-CM

## 2024-03-09 MED ORDER — LIDOCAINE HCL 1 % IJ SOLN: 20.0000 mL | Freq: Once | INTRAMUSCULAR | Status: DC

## 2024-03-09 MED ORDER — IOHEXOL 300 MG/ML  SOLN
50.0000 mL | Freq: Once | INTRAMUSCULAR | Status: AC | PRN
Start: 2024-03-09 — End: ?
  Administered 2024-03-17: 10 mL

## 2024-03-09 NOTE — Consult Note (Signed)
 VIR Brief Note:  Patient presented to VIR for consideration for gastrostomy tube placement.  Upon arrival to IR, patient expressed his opposition for tube placement.  Upon my evaluation, patient is answering questions appropriately and is oriented to person and place.  He is able to communicate clearly by writing on a clip board and made it clear to me after explanation of procedure, expected course, risks, and alternatives that he did not want to proceed.  Therefore, no procedure was performed.  This was subsequently discussed with Berry Bristol NP, by phone, at approximately 1325.  Please call with questions or concerns.

## 2024-03-10 ENCOUNTER — Institutional Professional Consult (permissible substitution) (HOSPITAL_COMMUNITY): Payer: Self-pay

## 2024-03-10 DIAGNOSIS — Z930 Tracheostomy status: Secondary | ICD-10-CM

## 2024-03-10 DIAGNOSIS — J189 Pneumonia, unspecified organism: Secondary | ICD-10-CM

## 2024-03-10 DIAGNOSIS — Y95 Nosocomial condition: Secondary | ICD-10-CM

## 2024-03-10 DIAGNOSIS — J9621 Acute and chronic respiratory failure with hypoxia: Secondary | ICD-10-CM

## 2024-03-10 DIAGNOSIS — G931 Anoxic brain damage, not elsewhere classified: Secondary | ICD-10-CM

## 2024-03-10 DIAGNOSIS — R6520 Severe sepsis without septic shock: Secondary | ICD-10-CM

## 2024-03-10 LAB — MAGNESIUM: Magnesium: 1.6 mg/dL — ABNORMAL LOW (ref 1.7–2.4)

## 2024-03-11 ENCOUNTER — Institutional Professional Consult (permissible substitution) (HOSPITAL_COMMUNITY): Payer: Self-pay

## 2024-03-11 DIAGNOSIS — J9621 Acute and chronic respiratory failure with hypoxia: Secondary | ICD-10-CM

## 2024-03-11 DIAGNOSIS — G931 Anoxic brain damage, not elsewhere classified: Secondary | ICD-10-CM

## 2024-03-11 DIAGNOSIS — J189 Pneumonia, unspecified organism: Secondary | ICD-10-CM

## 2024-03-11 DIAGNOSIS — Y95 Nosocomial condition: Secondary | ICD-10-CM

## 2024-03-11 DIAGNOSIS — Z930 Tracheostomy status: Secondary | ICD-10-CM

## 2024-03-11 DIAGNOSIS — R6520 Severe sepsis without septic shock: Secondary | ICD-10-CM

## 2024-03-11 LAB — CBC
HCT: 25.5 % — ABNORMAL LOW (ref 39.0–52.0)
Hemoglobin: 8.2 g/dL — ABNORMAL LOW (ref 13.0–17.0)
MCH: 24.6 pg — ABNORMAL LOW (ref 26.0–34.0)
MCHC: 32.2 g/dL (ref 30.0–36.0)
MCV: 76.3 fL — ABNORMAL LOW (ref 80.0–100.0)
Platelets: 359 10*3/uL (ref 150–400)
RBC: 3.34 MIL/uL — ABNORMAL LOW (ref 4.22–5.81)
RDW: 15 % (ref 11.5–15.5)
WBC: 7.7 10*3/uL (ref 4.0–10.5)
nRBC: 0 % (ref 0.0–0.2)

## 2024-03-11 NOTE — Consult Note (Signed)
 VIR Brief Note:   Patient presented to VIR for consideration for gastrostomy tube placement.  Upon arrival to IR, patient again expressed his opposition for tube placement.  Upon my evaluation, patient is answering questions appropriately and is oriented to person and place.  He is able to communicate clearly by writing on a clip board and made it clear to me after explanation of procedure, expected course, risks, and alternatives that he did not want to proceed.  His daughter, Leland Purser, was called and witnessed our discussion.  She plans to be available on Friday.   Please call with questions or concerns.

## 2024-03-11 NOTE — Progress Notes (Signed)
 IR was requested for G tube placement.    Patient was brought down to IR on Monday 6/9, refused. Per order patient cannot consent for himself but patient was evaluated by Dr. Laverta Potters, he was able to answer questions appropriately and is oriented to person and place. Nonetheless, IR is not able to perform a procedure on a patient who is adamantly refusing the procedure as IR can only provide conscious sedation which requires patient's cooperation.     IR was informed on 6/10 that patient is now agreeable to proceed.    Patient was brought down to IR again on 6/11, he refused again. His daughter [name] was informed about the patient's refusal via telephone.    IR will not bring the patient down to IR this week for another attempt due to lack of back up IR radiologist for the rest of the week. [facility] team notified.  Will discuss with [facility] team next week.   Please call IR for questions and concerns.   Jason Kunzler H Khadejah Son PA-C 03/11/2024 12:21 PM

## 2024-03-11 NOTE — Progress Notes (Signed)
 IR was informed by [facility] team on 6/10 that patient is now agreeable to proceed with G tube placement. Asked [facility] team to place an order fot G tube and hold TF/Lovenox .   No order as of 1000 hrs 03/11/24.   Called [facility] and asked for an order, confirmed with RN that TF and Lovenox  have been held since [place].  Patient formally seen on 03/06/24.  CT chest yesterday showed LLL PNA, no CBC since 6/8. Has been afebrile.  Discussed with Dr. Laverta Potters, will obtain CBC but can proceed with G tube before it is resulted.   Will plan to proceed with G tube today.  Jason Rojas H Jason Chai PA-C 03/11/2024 10:34 AM

## 2024-03-11 NOTE — Sedation Documentation (Addendum)
 On arrival to IR with Mimi, RT and Nellie Banas, Paramedic, pt appeared calm. As the team began asking questions and confirming consent of procedure, pt became visibly frustrated. Simon MD arrived to discuss procedure with pt. When asked by MD, pt indicated not wishing to proceed with G tube placement. Daughter on the phone, aware of situation. Pt able to answer orientation questions via gesture. Per MD Laverta Potters, procedure canceled today and will attempt to reschedule on Friday when daughter will be present at bedside. Pt transported back to unit with [facility] RN and RT. See MD note.

## 2024-03-12 ENCOUNTER — Institutional Professional Consult (permissible substitution) (HOSPITAL_COMMUNITY): Payer: Self-pay

## 2024-03-12 DIAGNOSIS — Z930 Tracheostomy status: Secondary | ICD-10-CM

## 2024-03-12 DIAGNOSIS — Y95 Nosocomial condition: Secondary | ICD-10-CM

## 2024-03-12 DIAGNOSIS — R6520 Severe sepsis without septic shock: Secondary | ICD-10-CM

## 2024-03-12 DIAGNOSIS — J9621 Acute and chronic respiratory failure with hypoxia: Secondary | ICD-10-CM

## 2024-03-12 DIAGNOSIS — J189 Pneumonia, unspecified organism: Secondary | ICD-10-CM

## 2024-03-12 DIAGNOSIS — G931 Anoxic brain damage, not elsewhere classified: Secondary | ICD-10-CM

## 2024-03-12 LAB — CULTURE, RESPIRATORY W GRAM STAIN

## 2024-03-13 DIAGNOSIS — J9621 Acute and chronic respiratory failure with hypoxia: Secondary | ICD-10-CM

## 2024-03-13 DIAGNOSIS — J189 Pneumonia, unspecified organism: Secondary | ICD-10-CM

## 2024-03-13 DIAGNOSIS — G931 Anoxic brain damage, not elsewhere classified: Secondary | ICD-10-CM

## 2024-03-13 DIAGNOSIS — Z930 Tracheostomy status: Secondary | ICD-10-CM

## 2024-03-13 DIAGNOSIS — R6520 Severe sepsis without septic shock: Secondary | ICD-10-CM

## 2024-03-13 DIAGNOSIS — Y95 Nosocomial condition: Secondary | ICD-10-CM

## 2024-03-14 DIAGNOSIS — J9621 Acute and chronic respiratory failure with hypoxia: Secondary | ICD-10-CM

## 2024-03-14 DIAGNOSIS — Y95 Nosocomial condition: Secondary | ICD-10-CM

## 2024-03-14 DIAGNOSIS — Z930 Tracheostomy status: Secondary | ICD-10-CM

## 2024-03-14 DIAGNOSIS — R6520 Severe sepsis without septic shock: Secondary | ICD-10-CM

## 2024-03-14 DIAGNOSIS — J189 Pneumonia, unspecified organism: Secondary | ICD-10-CM

## 2024-03-14 DIAGNOSIS — G931 Anoxic brain damage, not elsewhere classified: Secondary | ICD-10-CM

## 2024-03-15 ENCOUNTER — Institutional Professional Consult (permissible substitution) (HOSPITAL_COMMUNITY): Payer: Self-pay

## 2024-03-15 DIAGNOSIS — J189 Pneumonia, unspecified organism: Secondary | ICD-10-CM

## 2024-03-15 DIAGNOSIS — Y95 Nosocomial condition: Secondary | ICD-10-CM

## 2024-03-15 DIAGNOSIS — R6520 Severe sepsis without septic shock: Secondary | ICD-10-CM

## 2024-03-15 DIAGNOSIS — J9621 Acute and chronic respiratory failure with hypoxia: Secondary | ICD-10-CM

## 2024-03-15 DIAGNOSIS — Z930 Tracheostomy status: Secondary | ICD-10-CM

## 2024-03-15 DIAGNOSIS — G931 Anoxic brain damage, not elsewhere classified: Secondary | ICD-10-CM

## 2024-03-15 LAB — BASIC METABOLIC PANEL WITH GFR
Anion gap: 9 (ref 5–15)
BUN: 10 mg/dL (ref 8–23)
CO2: 24 mmol/L (ref 22–32)
Calcium: 9 mg/dL (ref 8.9–10.3)
Chloride: 101 mmol/L (ref 98–111)
Creatinine, Ser: 0.39 mg/dL — ABNORMAL LOW (ref 0.61–1.24)
GFR, Estimated: >60 mL/min (ref >60)
Glucose, Bld: 106 mg/dL — ABNORMAL HIGH (ref 70–99)
Potassium: 3.7 mmol/L (ref 3.5–5.1)
Sodium: 134 mmol/L — ABNORMAL LOW (ref 135–145)

## 2024-03-15 LAB — CBC
HCT: 24.9 % — ABNORMAL LOW (ref 39.0–52.0)
Hemoglobin: 7.9 g/dL — ABNORMAL LOW (ref 13.0–17.0)
MCH: 24.1 pg — ABNORMAL LOW (ref 26.0–34.0)
MCHC: 31.7 g/dL (ref 30.0–36.0)
MCV: 75.9 fL — ABNORMAL LOW (ref 80.0–100.0)
Platelets: 331 10*3/uL (ref 150–400)
RBC: 3.28 MIL/uL — ABNORMAL LOW (ref 4.22–5.81)
RDW: 15.2 % (ref 11.5–15.5)
WBC: 7.5 10*3/uL (ref 4.0–10.5)
nRBC: 0 % (ref 0.0–0.2)

## 2024-03-15 LAB — MAGNESIUM: Magnesium: 1.7 mg/dL (ref 1.7–2.4)

## 2024-03-16 DIAGNOSIS — Z930 Tracheostomy status: Secondary | ICD-10-CM

## 2024-03-16 DIAGNOSIS — G931 Anoxic brain damage, not elsewhere classified: Secondary | ICD-10-CM

## 2024-03-16 DIAGNOSIS — J189 Pneumonia, unspecified organism: Secondary | ICD-10-CM

## 2024-03-16 DIAGNOSIS — Y95 Nosocomial condition: Secondary | ICD-10-CM

## 2024-03-16 DIAGNOSIS — R6520 Severe sepsis without septic shock: Secondary | ICD-10-CM

## 2024-03-16 DIAGNOSIS — J9621 Acute and chronic respiratory failure with hypoxia: Secondary | ICD-10-CM

## 2024-03-16 NOTE — Progress Notes (Signed)
 Brief Interventional Radiology Note:  Received a call from [facility] Dr. Valorie Gearing requesting that we please consider placing this patient's G-tube again. States that patient's NGT was accidentally removed and they no longer have access to give him meds or nutrition. Dr. Adriane Hora was very understanding given our previous interactions with the patient and would like to try to coordinate a time for her and patient's daughter Darien Eden Kim-Hatchet to come down to IR and be present with the patient for consent before procedure. Dicussed with Tylene Galla, RN about the best time to coordinate this. After discussion with both Lavonia Powers and Dr. Adriane Hora, will plan for patient, MD, and daughter to be in IR at 8:00am on 6/17 for gastrostomy tube consent/procedure.   Please call IR for any additional questions/concerns.  Electronically Signed: Ramari Bray M Mikiyah Glasner, PA-C 03/16/2024, 3:42 PM

## 2024-03-17 ENCOUNTER — Institutional Professional Consult (permissible substitution) (HOSPITAL_COMMUNITY): Payer: Self-pay

## 2024-03-17 DIAGNOSIS — R6520 Severe sepsis without septic shock: Secondary | ICD-10-CM

## 2024-03-17 DIAGNOSIS — Z930 Tracheostomy status: Secondary | ICD-10-CM

## 2024-03-17 DIAGNOSIS — J189 Pneumonia, unspecified organism: Secondary | ICD-10-CM

## 2024-03-17 DIAGNOSIS — G931 Anoxic brain damage, not elsewhere classified: Secondary | ICD-10-CM

## 2024-03-17 DIAGNOSIS — Y95 Nosocomial condition: Secondary | ICD-10-CM

## 2024-03-17 DIAGNOSIS — J9621 Acute and chronic respiratory failure with hypoxia: Secondary | ICD-10-CM

## 2024-03-17 MED ORDER — LIDOCAINE HCL (PF) 1 % IJ SOLN: 30.0000 mL | Freq: Once | INTRAMUSCULAR | Status: DC

## 2024-03-17 MED ORDER — GLUCAGON HCL RDNA (DIAGNOSTIC) 1 MG IJ SOLR
INTRAMUSCULAR | Status: AC | PRN
  Administered 2024-03-17: .5 mg via INTRAVENOUS

## 2024-03-17 MED ORDER — LIDOCAINE HCL (PF) 1 % IJ SOLN
30.0000 mL | Freq: Once | INTRAMUSCULAR | Status: AC
  Administered 2024-03-17: 30 mL via INTRADERMAL

## 2024-03-17 MED ORDER — FENTANYL CITRATE (PF) 100 MCG/2ML IJ SOLN
INTRAMUSCULAR | Status: AC | PRN
  Administered 2024-03-17 (×2): 50 ug via INTRAVENOUS

## 2024-03-17 MED ORDER — MIDAZOLAM HCL 2 MG/2ML IJ SOLN
INTRAMUSCULAR | Status: AC | PRN
  Administered 2024-03-17 (×2): 1 mg via INTRAVENOUS

## 2024-03-17 MED ORDER — LIDOCAINE VISCOUS HCL 2 % MT SOLN
15.0000 mL | OROMUCOSAL | Status: AC
  Administered 2024-03-17: 15 mL via OROMUCOSAL

## 2024-03-17 MED ORDER — CEFAZOLIN SODIUM-DEXTROSE 2-4 GM/100ML-% IV SOLN
INTRAVENOUS | Status: AC | PRN
  Administered 2024-03-17: 2 g via INTRAVENOUS

## 2024-03-17 MED ORDER — IOHEXOL 300 MG/ML  SOLN: 50.0000 mL | Freq: Once | INTRAMUSCULAR | Status: DC | PRN

## 2024-03-17 NOTE — Procedures (Signed)
 Vascular and Interventional Radiology Procedure Note  Patient: Jason Rojas DOB: [birth year] Medical Record Number: [id number] Note Date/Time: 03/17/24 10:16 AM   Performing Physician: Art Largo, MD Assistant(s): None  Diagnosis: Dysphagia. Trach / Vented  Procedure: PERCUTANEOUS GASTROSTOMY TUBE PLACEMENT  Anesthesia: Conscious Sedation Complications: None Estimated Blood Loss: Minimal  Findings:  Successful placement of a 31F gastrostomy tube under fluoroscopy.   See detailed procedure note with images in PACS. The patient tolerated the procedure well without incident or complication and was returned to Recovery in stable condition.    Art Largo, MD [facility] Radiology   Pager. [phone] Clinic. [phone]

## 2024-03-18 DIAGNOSIS — J189 Pneumonia, unspecified organism: Secondary | ICD-10-CM

## 2024-03-18 DIAGNOSIS — Z930 Tracheostomy status: Secondary | ICD-10-CM

## 2024-03-18 DIAGNOSIS — G931 Anoxic brain damage, not elsewhere classified: Secondary | ICD-10-CM

## 2024-03-18 DIAGNOSIS — J9621 Acute and chronic respiratory failure with hypoxia: Secondary | ICD-10-CM

## 2024-03-18 DIAGNOSIS — Y95 Nosocomial condition: Secondary | ICD-10-CM

## 2024-03-18 DIAGNOSIS — R6520 Severe sepsis without septic shock: Secondary | ICD-10-CM

## 2024-03-19 DIAGNOSIS — R6520 Severe sepsis without septic shock: Secondary | ICD-10-CM

## 2024-03-19 DIAGNOSIS — G931 Anoxic brain damage, not elsewhere classified: Secondary | ICD-10-CM

## 2024-03-19 DIAGNOSIS — Y95 Nosocomial condition: Secondary | ICD-10-CM

## 2024-03-19 DIAGNOSIS — J189 Pneumonia, unspecified organism: Secondary | ICD-10-CM

## 2024-03-19 DIAGNOSIS — J9621 Acute and chronic respiratory failure with hypoxia: Secondary | ICD-10-CM

## 2024-03-19 DIAGNOSIS — Z930 Tracheostomy status: Secondary | ICD-10-CM

## 2024-03-19 LAB — BASIC METABOLIC PANEL WITH GFR
Anion gap: 11 (ref 5–15)
BUN: 10 mg/dL (ref 8–23)
CO2: 24 mmol/L (ref 22–32)
Calcium: 9.3 mg/dL (ref 8.9–10.3)
Chloride: 98 mmol/L (ref 98–111)
Creatinine, Ser: 0.4 mg/dL — ABNORMAL LOW (ref 0.61–1.24)
GFR, Estimated: >60 mL/min (ref >60)
Glucose, Bld: 117 mg/dL — ABNORMAL HIGH (ref 70–99)
Potassium: 3.8 mmol/L (ref 3.5–5.1)
Sodium: 133 mmol/L — ABNORMAL LOW (ref 135–145)

## 2024-03-19 LAB — CBC
HCT: 26.7 % — ABNORMAL LOW (ref 39.0–52.0)
Hemoglobin: 8.5 g/dL — ABNORMAL LOW (ref 13.0–17.0)
MCH: 23.9 pg — ABNORMAL LOW (ref 26.0–34.0)
MCHC: 31.8 g/dL (ref 30.0–36.0)
MCV: 75.2 fL — ABNORMAL LOW (ref 80.0–100.0)
Platelets: 293 10*3/uL (ref 150–400)
RBC: 3.55 MIL/uL — ABNORMAL LOW (ref 4.22–5.81)
RDW: 15.4 % (ref 11.5–15.5)
WBC: 7.6 10*3/uL (ref 4.0–10.5)
nRBC: 0 % (ref 0.0–0.2)

## 2024-03-19 LAB — MAGNESIUM: Magnesium: 1.6 mg/dL — ABNORMAL LOW (ref 1.7–2.4)

## 2024-03-20 ENCOUNTER — Institutional Professional Consult (permissible substitution) (HOSPITAL_COMMUNITY): Payer: Self-pay

## 2024-03-20 DIAGNOSIS — J189 Pneumonia, unspecified organism: Secondary | ICD-10-CM

## 2024-03-20 DIAGNOSIS — G931 Anoxic brain damage, not elsewhere classified: Secondary | ICD-10-CM

## 2024-03-20 DIAGNOSIS — J9621 Acute and chronic respiratory failure with hypoxia: Secondary | ICD-10-CM

## 2024-03-20 DIAGNOSIS — Z930 Tracheostomy status: Secondary | ICD-10-CM

## 2024-03-20 DIAGNOSIS — R6520 Severe sepsis without septic shock: Secondary | ICD-10-CM

## 2024-03-20 DIAGNOSIS — Y95 Nosocomial condition: Secondary | ICD-10-CM

## 2024-03-20 LAB — MAGNESIUM: Magnesium: 1.8 mg/dL (ref 1.7–2.4)

## 2024-03-21 DIAGNOSIS — Y95 Nosocomial condition: Secondary | ICD-10-CM

## 2024-03-21 DIAGNOSIS — Z930 Tracheostomy status: Secondary | ICD-10-CM

## 2024-03-21 DIAGNOSIS — J9621 Acute and chronic respiratory failure with hypoxia: Secondary | ICD-10-CM

## 2024-03-21 DIAGNOSIS — G931 Anoxic brain damage, not elsewhere classified: Secondary | ICD-10-CM

## 2024-03-21 DIAGNOSIS — J189 Pneumonia, unspecified organism: Secondary | ICD-10-CM

## 2024-03-21 DIAGNOSIS — R6520 Severe sepsis without septic shock: Secondary | ICD-10-CM

## 2024-03-22 ENCOUNTER — Institutional Professional Consult (permissible substitution) (HOSPITAL_COMMUNITY): Payer: Self-pay

## 2024-03-22 DIAGNOSIS — Y95 Nosocomial condition: Secondary | ICD-10-CM

## 2024-03-22 DIAGNOSIS — J9621 Acute and chronic respiratory failure with hypoxia: Secondary | ICD-10-CM

## 2024-03-22 DIAGNOSIS — J189 Pneumonia, unspecified organism: Secondary | ICD-10-CM

## 2024-03-22 DIAGNOSIS — R6520 Severe sepsis without septic shock: Secondary | ICD-10-CM

## 2024-03-22 DIAGNOSIS — G931 Anoxic brain damage, not elsewhere classified: Secondary | ICD-10-CM

## 2024-03-22 DIAGNOSIS — Z930 Tracheostomy status: Secondary | ICD-10-CM

## 2024-03-23 ENCOUNTER — Institutional Professional Consult (permissible substitution) (HOSPITAL_COMMUNITY): Payer: Self-pay

## 2024-03-23 ENCOUNTER — Other Ambulatory Visit (HOSPITAL_COMMUNITY)

## 2024-03-23 DIAGNOSIS — J9621 Acute and chronic respiratory failure with hypoxia: Secondary | ICD-10-CM

## 2024-03-23 DIAGNOSIS — R6520 Severe sepsis without septic shock: Secondary | ICD-10-CM

## 2024-03-23 DIAGNOSIS — J189 Pneumonia, unspecified organism: Secondary | ICD-10-CM

## 2024-03-23 DIAGNOSIS — G931 Anoxic brain damage, not elsewhere classified: Secondary | ICD-10-CM

## 2024-03-23 DIAGNOSIS — Y95 Nosocomial condition: Secondary | ICD-10-CM

## 2024-03-23 DIAGNOSIS — Z930 Tracheostomy status: Secondary | ICD-10-CM

## 2024-03-23 LAB — CBC
HCT: 26 % — ABNORMAL LOW (ref 39.0–52.0)
Hemoglobin: 8.2 g/dL — ABNORMAL LOW (ref 13.0–17.0)
MCH: 24.1 pg — ABNORMAL LOW (ref 26.0–34.0)
MCHC: 31.5 g/dL (ref 30.0–36.0)
MCV: 76.5 fL — ABNORMAL LOW (ref 80.0–100.0)
Platelets: 252 10*3/uL (ref 150–400)
RBC: 3.4 MIL/uL — ABNORMAL LOW (ref 4.22–5.81)
RDW: 15.7 % — ABNORMAL HIGH (ref 11.5–15.5)
WBC: 5.8 10*3/uL (ref 4.0–10.5)
nRBC: 0 % (ref 0.0–0.2)

## 2024-03-23 LAB — BASIC METABOLIC PANEL WITH GFR
Anion gap: 11 (ref 5–15)
BUN: 14 mg/dL (ref 8–23)
CO2: 20 mmol/L — ABNORMAL LOW (ref 22–32)
Calcium: 9.1 mg/dL (ref 8.9–10.3)
Chloride: 101 mmol/L (ref 98–111)
Creatinine, Ser: 0.4 mg/dL — ABNORMAL LOW (ref 0.61–1.24)
GFR, Estimated: >60 mL/min (ref >60)
Glucose, Bld: 113 mg/dL — ABNORMAL HIGH (ref 70–99)
Potassium: 4.1 mmol/L (ref 3.5–5.1)
Sodium: 132 mmol/L — ABNORMAL LOW (ref 135–145)

## 2024-03-23 LAB — MAGNESIUM: Magnesium: 1.9 mg/dL (ref 1.7–2.4)

## 2024-03-24 ENCOUNTER — Institutional Professional Consult (permissible substitution) (HOSPITAL_COMMUNITY): Payer: Self-pay

## 2024-03-24 DIAGNOSIS — J9621 Acute and chronic respiratory failure with hypoxia: Secondary | ICD-10-CM

## 2024-03-24 DIAGNOSIS — Y95 Nosocomial condition: Secondary | ICD-10-CM

## 2024-03-24 DIAGNOSIS — J189 Pneumonia, unspecified organism: Secondary | ICD-10-CM

## 2024-03-24 DIAGNOSIS — G931 Anoxic brain damage, not elsewhere classified: Secondary | ICD-10-CM

## 2024-03-24 DIAGNOSIS — Z930 Tracheostomy status: Secondary | ICD-10-CM

## 2024-03-24 DIAGNOSIS — R6520 Severe sepsis without septic shock: Secondary | ICD-10-CM

## 2024-03-24 LAB — CBC WITH DIFFERENTIAL/PLATELET
Abs Immature Granulocytes: 0.07 10*3/uL (ref 0.00–0.07)
Basophils Absolute: 0 10*3/uL (ref 0.0–0.1)
Basophils Relative: 0 %
Eosinophils Absolute: 0.1 10*3/uL (ref 0.0–0.5)
Eosinophils Relative: 1 %
HCT: 29.2 % — ABNORMAL LOW (ref 39.0–52.0)
Hemoglobin: 9.1 g/dL — ABNORMAL LOW (ref 13.0–17.0)
Immature Granulocytes: 1 %
Lymphocytes Relative: 11 %
Lymphs Abs: 1.7 10*3/uL (ref 0.7–4.0)
MCH: 23.5 pg — ABNORMAL LOW (ref 26.0–34.0)
MCHC: 31.2 g/dL (ref 30.0–36.0)
MCV: 75.5 fL — ABNORMAL LOW (ref 80.0–100.0)
Monocytes Absolute: 0.9 10*3/uL (ref 0.1–1.0)
Monocytes Relative: 6 %
Neutro Abs: 12.3 10*3/uL — ABNORMAL HIGH (ref 1.7–7.7)
Neutrophils Relative %: 81 %
Platelets: 302 10*3/uL (ref 150–400)
RBC: 3.87 MIL/uL — ABNORMAL LOW (ref 4.22–5.81)
RDW: 15.8 % — ABNORMAL HIGH (ref 11.5–15.5)
WBC: 15.1 10*3/uL — ABNORMAL HIGH (ref 4.0–10.5)
nRBC: 0 % (ref 0.0–0.2)

## 2024-03-24 LAB — COMPREHENSIVE METABOLIC PANEL WITH GFR
ALT: 20 U/L (ref 0–44)
AST: 14 U/L — ABNORMAL LOW (ref 15–41)
Albumin: 3.6 g/dL (ref 3.5–5.0)
Alkaline Phosphatase: 77 U/L (ref 38–126)
Anion gap: 13 (ref 5–15)
BUN: 16 mg/dL (ref 8–23)
CO2: 22 mmol/L (ref 22–32)
Calcium: 9.6 mg/dL (ref 8.9–10.3)
Chloride: 97 mmol/L — ABNORMAL LOW (ref 98–111)
Creatinine, Ser: 0.64 mg/dL (ref 0.61–1.24)
GFR, Estimated: >60 mL/min (ref >60)
Glucose, Bld: 168 mg/dL — ABNORMAL HIGH (ref 70–99)
Potassium: 3.2 mmol/L — ABNORMAL LOW (ref 3.5–5.1)
Sodium: 132 mmol/L — ABNORMAL LOW (ref 135–145)
Total Bilirubin: 0.9 mg/dL (ref 0.0–1.2)
Total Protein: 8 g/dL (ref 6.5–8.1)

## 2024-03-24 LAB — LIPID PANEL
Cholesterol: 125 mg/dL (ref 0–200)
HDL: 51 mg/dL (ref >40)
LDL Cholesterol: 66 mg/dL (ref 0–99)
Total CHOL/HDL Ratio: 2.5 ratio
Triglycerides: 40 mg/dL (ref <150)
VLDL: 8 mg/dL (ref 0–40)

## 2024-03-24 LAB — IRON AND TIBC
Iron: 18 ug/dL — ABNORMAL LOW (ref 45–182)
Saturation Ratios: 5 % — ABNORMAL LOW (ref 17.9–39.5)
TIBC: 346 ug/dL (ref 250–450)
UIBC: 328 ug/dL

## 2024-03-24 LAB — LACTIC ACID, PLASMA: Lactic Acid, Venous: 1.2 mmol/L (ref 0.5–1.9)

## 2024-03-24 LAB — FOLATE: Folate: 36 ng/mL (ref >5.9)

## 2024-03-24 LAB — FERRITIN: Ferritin: 76 ng/mL (ref 24–336)

## 2024-03-25 ENCOUNTER — Institutional Professional Consult (permissible substitution) (HOSPITAL_COMMUNITY): Payer: Self-pay

## 2024-03-25 DIAGNOSIS — Z930 Tracheostomy status: Secondary | ICD-10-CM

## 2024-03-25 DIAGNOSIS — J189 Pneumonia, unspecified organism: Secondary | ICD-10-CM

## 2024-03-25 DIAGNOSIS — Y95 Nosocomial condition: Secondary | ICD-10-CM

## 2024-03-25 DIAGNOSIS — G931 Anoxic brain damage, not elsewhere classified: Secondary | ICD-10-CM

## 2024-03-25 DIAGNOSIS — J9621 Acute and chronic respiratory failure with hypoxia: Secondary | ICD-10-CM

## 2024-03-25 DIAGNOSIS — R6520 Severe sepsis without septic shock: Secondary | ICD-10-CM

## 2024-03-25 LAB — BASIC METABOLIC PANEL WITH GFR
Anion gap: 14 (ref 5–15)
BUN: 17 mg/dL (ref 8–23)
CO2: 18 mmol/L — ABNORMAL LOW (ref 22–32)
Calcium: 9.7 mg/dL (ref 8.9–10.3)
Chloride: 102 mmol/L (ref 98–111)
Creatinine, Ser: 0.5 mg/dL — ABNORMAL LOW (ref 0.61–1.24)
GFR, Estimated: >60 mL/min (ref >60)
Glucose, Bld: 107 mg/dL — ABNORMAL HIGH (ref 70–99)
Potassium: 5.1 mmol/L (ref 3.5–5.1)
Sodium: 134 mmol/L — ABNORMAL LOW (ref 135–145)

## 2024-03-25 LAB — MAGNESIUM: Magnesium: 2.1 mg/dL (ref 1.7–2.4)

## 2024-03-26 ENCOUNTER — Institutional Professional Consult (permissible substitution) (HOSPITAL_COMMUNITY): Payer: Self-pay

## 2024-03-26 DIAGNOSIS — J189 Pneumonia, unspecified organism: Secondary | ICD-10-CM

## 2024-03-26 DIAGNOSIS — Y95 Nosocomial condition: Secondary | ICD-10-CM

## 2024-03-26 DIAGNOSIS — Z930 Tracheostomy status: Secondary | ICD-10-CM

## 2024-03-26 DIAGNOSIS — J9621 Acute and chronic respiratory failure with hypoxia: Secondary | ICD-10-CM

## 2024-03-26 DIAGNOSIS — G931 Anoxic brain damage, not elsewhere classified: Secondary | ICD-10-CM

## 2024-03-26 DIAGNOSIS — R6520 Severe sepsis without septic shock: Secondary | ICD-10-CM

## 2024-03-26 LAB — FOLATE RBC
Folate, Hemolysate: 425 ng/mL
Folate, RBC: 1476 ng/mL (ref >498)
Hematocrit: 28.8 % — ABNORMAL LOW (ref 37.5–51.0)

## 2024-03-26 LAB — VANCOMYCIN, TROUGH: Vancomycin Tr: 12 ug/mL — ABNORMAL LOW (ref 15–20)

## 2024-03-27 DIAGNOSIS — R6520 Severe sepsis without septic shock: Secondary | ICD-10-CM

## 2024-03-27 DIAGNOSIS — G931 Anoxic brain damage, not elsewhere classified: Secondary | ICD-10-CM

## 2024-03-27 DIAGNOSIS — Y95 Nosocomial condition: Secondary | ICD-10-CM

## 2024-03-27 DIAGNOSIS — J9621 Acute and chronic respiratory failure with hypoxia: Secondary | ICD-10-CM

## 2024-03-27 DIAGNOSIS — J189 Pneumonia, unspecified organism: Secondary | ICD-10-CM

## 2024-03-27 DIAGNOSIS — Z930 Tracheostomy status: Secondary | ICD-10-CM

## 2024-03-27 LAB — CULTURE, RESPIRATORY W GRAM STAIN

## 2024-03-28 DIAGNOSIS — R6520 Severe sepsis without septic shock: Secondary | ICD-10-CM

## 2024-03-28 DIAGNOSIS — Y95 Nosocomial condition: Secondary | ICD-10-CM

## 2024-03-28 DIAGNOSIS — Z930 Tracheostomy status: Secondary | ICD-10-CM

## 2024-03-28 DIAGNOSIS — J9621 Acute and chronic respiratory failure with hypoxia: Secondary | ICD-10-CM

## 2024-03-28 DIAGNOSIS — G931 Anoxic brain damage, not elsewhere classified: Secondary | ICD-10-CM

## 2024-03-28 DIAGNOSIS — J189 Pneumonia, unspecified organism: Secondary | ICD-10-CM

## 2024-03-29 DIAGNOSIS — R6520 Severe sepsis without septic shock: Secondary | ICD-10-CM

## 2024-03-29 DIAGNOSIS — G931 Anoxic brain damage, not elsewhere classified: Secondary | ICD-10-CM

## 2024-03-29 DIAGNOSIS — Y95 Nosocomial condition: Secondary | ICD-10-CM

## 2024-03-29 DIAGNOSIS — Z930 Tracheostomy status: Secondary | ICD-10-CM

## 2024-03-29 DIAGNOSIS — J9621 Acute and chronic respiratory failure with hypoxia: Secondary | ICD-10-CM

## 2024-03-29 DIAGNOSIS — J189 Pneumonia, unspecified organism: Secondary | ICD-10-CM

## 2024-03-30 DIAGNOSIS — R6520 Severe sepsis without septic shock: Secondary | ICD-10-CM

## 2024-03-30 DIAGNOSIS — J189 Pneumonia, unspecified organism: Secondary | ICD-10-CM

## 2024-03-30 DIAGNOSIS — Y95 Nosocomial condition: Secondary | ICD-10-CM

## 2024-03-30 DIAGNOSIS — J9621 Acute and chronic respiratory failure with hypoxia: Secondary | ICD-10-CM

## 2024-03-30 DIAGNOSIS — Z930 Tracheostomy status: Secondary | ICD-10-CM

## 2024-03-30 DIAGNOSIS — G931 Anoxic brain damage, not elsewhere classified: Secondary | ICD-10-CM

## 2024-03-30 LAB — BASIC METABOLIC PANEL WITH GFR
Anion gap: 11 (ref 5–15)
BUN: 20 mg/dL (ref 8–23)
CO2: 20 mmol/L — ABNORMAL LOW (ref 22–32)
Calcium: 9.6 mg/dL (ref 8.9–10.3)
Chloride: 110 mmol/L (ref 98–111)
Creatinine, Ser: 1.42 mg/dL — ABNORMAL HIGH (ref 0.61–1.24)
GFR, Estimated: 55 mL/min — ABNORMAL LOW (ref >60)
Glucose, Bld: 108 mg/dL — ABNORMAL HIGH (ref 70–99)
Potassium: 3.9 mmol/L (ref 3.5–5.1)
Sodium: 141 mmol/L (ref 135–145)

## 2024-03-30 LAB — CBC
HCT: 24.3 % — ABNORMAL LOW (ref 39.0–52.0)
Hemoglobin: 7.6 g/dL — ABNORMAL LOW (ref 13.0–17.0)
MCH: 23.4 pg — ABNORMAL LOW (ref 26.0–34.0)
MCHC: 31.3 g/dL (ref 30.0–36.0)
MCV: 74.8 fL — ABNORMAL LOW (ref 80.0–100.0)
Platelets: 337 10*3/uL (ref 150–400)
RBC: 3.25 MIL/uL — ABNORMAL LOW (ref 4.22–5.81)
RDW: 16.3 % — ABNORMAL HIGH (ref 11.5–15.5)
WBC: 6.7 10*3/uL (ref 4.0–10.5)
nRBC: 0 % (ref 0.0–0.2)

## 2024-03-30 LAB — MAGNESIUM: Magnesium: 1.9 mg/dL (ref 1.7–2.4)

## 2024-03-31 DIAGNOSIS — R6520 Severe sepsis without septic shock: Secondary | ICD-10-CM

## 2024-03-31 DIAGNOSIS — J189 Pneumonia, unspecified organism: Secondary | ICD-10-CM

## 2024-03-31 DIAGNOSIS — Z930 Tracheostomy status: Secondary | ICD-10-CM

## 2024-03-31 DIAGNOSIS — G931 Anoxic brain damage, not elsewhere classified: Secondary | ICD-10-CM

## 2024-03-31 DIAGNOSIS — Y95 Nosocomial condition: Secondary | ICD-10-CM

## 2024-03-31 DIAGNOSIS — J9621 Acute and chronic respiratory failure with hypoxia: Secondary | ICD-10-CM

## 2024-04-01 DIAGNOSIS — R6520 Severe sepsis without septic shock: Secondary | ICD-10-CM

## 2024-04-01 DIAGNOSIS — J189 Pneumonia, unspecified organism: Secondary | ICD-10-CM

## 2024-04-01 DIAGNOSIS — G931 Anoxic brain damage, not elsewhere classified: Secondary | ICD-10-CM

## 2024-04-01 DIAGNOSIS — Z930 Tracheostomy status: Secondary | ICD-10-CM

## 2024-04-01 DIAGNOSIS — Y95 Nosocomial condition: Secondary | ICD-10-CM

## 2024-04-01 DIAGNOSIS — J9621 Acute and chronic respiratory failure with hypoxia: Secondary | ICD-10-CM

## 2024-04-03 LAB — CBC
HCT: 27.1 % — ABNORMAL LOW (ref 39.0–52.0)
Hemoglobin: 8.6 g/dL — ABNORMAL LOW (ref 13.0–17.0)
MCH: 23.4 pg — ABNORMAL LOW (ref 26.0–34.0)
MCHC: 31.7 g/dL (ref 30.0–36.0)
MCV: 73.8 fL — ABNORMAL LOW (ref 80.0–100.0)
Platelets: 346 K/uL (ref 150–400)
RBC: 3.67 MIL/uL — ABNORMAL LOW (ref 4.22–5.81)
RDW: 16.6 % — ABNORMAL HIGH (ref 11.5–15.5)
WBC: 9.2 K/uL (ref 4.0–10.5)
nRBC: 0 % (ref 0.0–0.2)

## 2024-04-03 LAB — COMPREHENSIVE METABOLIC PANEL WITH GFR
ALT: 16 U/L (ref 0–44)
AST: 16 U/L (ref 15–41)
Albumin: 3.4 g/dL — ABNORMAL LOW (ref 3.5–5.0)
Alkaline Phosphatase: 72 U/L (ref 38–126)
Anion gap: 9 (ref 5–15)
BUN: 30 mg/dL — ABNORMAL HIGH (ref 8–23)
CO2: 23 mmol/L (ref 22–32)
Calcium: 9.7 mg/dL (ref 8.9–10.3)
Chloride: 110 mmol/L (ref 98–111)
Creatinine, Ser: 0.87 mg/dL (ref 0.61–1.24)
GFR, Estimated: >60 mL/min (ref >60)
Glucose, Bld: 100 mg/dL — ABNORMAL HIGH (ref 70–99)
Potassium: 3.8 mmol/L (ref 3.5–5.1)
Sodium: 142 mmol/L (ref 135–145)
Total Bilirubin: 0.4 mg/dL (ref 0.0–1.2)
Total Protein: 7.1 g/dL (ref 6.5–8.1)

## 2024-04-06 DIAGNOSIS — G931 Anoxic brain damage, not elsewhere classified: Secondary | ICD-10-CM

## 2024-04-06 DIAGNOSIS — Z930 Tracheostomy status: Secondary | ICD-10-CM

## 2024-04-06 DIAGNOSIS — R6520 Severe sepsis without septic shock: Secondary | ICD-10-CM

## 2024-04-06 DIAGNOSIS — Y95 Nosocomial condition: Secondary | ICD-10-CM

## 2024-04-06 DIAGNOSIS — J189 Pneumonia, unspecified organism: Secondary | ICD-10-CM

## 2024-04-06 DIAGNOSIS — J9621 Acute and chronic respiratory failure with hypoxia: Secondary | ICD-10-CM

## 2024-04-07 ENCOUNTER — Institutional Professional Consult (permissible substitution) (HOSPITAL_COMMUNITY): Payer: Self-pay

## 2024-04-07 DIAGNOSIS — R6520 Severe sepsis without septic shock: Secondary | ICD-10-CM

## 2024-04-07 DIAGNOSIS — Y95 Nosocomial condition: Secondary | ICD-10-CM

## 2024-04-07 DIAGNOSIS — Z930 Tracheostomy status: Secondary | ICD-10-CM

## 2024-04-07 DIAGNOSIS — J189 Pneumonia, unspecified organism: Secondary | ICD-10-CM

## 2024-04-07 DIAGNOSIS — G931 Anoxic brain damage, not elsewhere classified: Secondary | ICD-10-CM

## 2024-04-07 DIAGNOSIS — J9621 Acute and chronic respiratory failure with hypoxia: Secondary | ICD-10-CM

## 2024-04-07 LAB — COMPREHENSIVE METABOLIC PANEL WITH GFR
ALT: 18 U/L (ref 0–44)
AST: 19 U/L (ref 15–41)
Albumin: 3.5 g/dL (ref 3.5–5.0)
Alkaline Phosphatase: 78 U/L (ref 38–126)
Anion gap: 13 (ref 5–15)
BUN: 37 mg/dL — ABNORMAL HIGH (ref 8–23)
CO2: 26 mmol/L (ref 22–32)
Calcium: 9.8 mg/dL (ref 8.9–10.3)
Chloride: 103 mmol/L (ref 98–111)
Creatinine, Ser: 0.93 mg/dL (ref 0.61–1.24)
GFR, Estimated: >60 mL/min (ref >60)
Glucose, Bld: 143 mg/dL — ABNORMAL HIGH (ref 70–99)
Potassium: 5.4 mmol/L — ABNORMAL HIGH (ref 3.5–5.1)
Sodium: 142 mmol/L (ref 135–145)
Total Bilirubin: 0.3 mg/dL (ref 0.0–1.2)
Total Protein: 7.5 g/dL (ref 6.5–8.1)

## 2024-04-07 LAB — CBC
HCT: 27.7 % — ABNORMAL LOW (ref 39.0–52.0)
Hemoglobin: 8.5 g/dL — ABNORMAL LOW (ref 13.0–17.0)
MCH: 23.5 pg — ABNORMAL LOW (ref 26.0–34.0)
MCHC: 30.7 g/dL (ref 30.0–36.0)
MCV: 76.5 fL — ABNORMAL LOW (ref 80.0–100.0)
Platelets: 357 K/uL (ref 150–400)
RBC: 3.62 MIL/uL — ABNORMAL LOW (ref 4.22–5.81)
RDW: 17 % — ABNORMAL HIGH (ref 11.5–15.5)
WBC: 11 K/uL — ABNORMAL HIGH (ref 4.0–10.5)
nRBC: 0 % (ref 0.0–0.2)

## 2024-04-08 ENCOUNTER — Institutional Professional Consult (permissible substitution) (HOSPITAL_COMMUNITY): Payer: Self-pay

## 2024-04-08 DIAGNOSIS — Y95 Nosocomial condition: Secondary | ICD-10-CM

## 2024-04-08 DIAGNOSIS — G931 Anoxic brain damage, not elsewhere classified: Secondary | ICD-10-CM

## 2024-04-08 DIAGNOSIS — R6520 Severe sepsis without septic shock: Secondary | ICD-10-CM

## 2024-04-08 DIAGNOSIS — Z930 Tracheostomy status: Secondary | ICD-10-CM

## 2024-04-08 DIAGNOSIS — J189 Pneumonia, unspecified organism: Secondary | ICD-10-CM

## 2024-04-08 DIAGNOSIS — J9621 Acute and chronic respiratory failure with hypoxia: Secondary | ICD-10-CM

## 2024-04-08 LAB — BASIC METABOLIC PANEL WITH GFR
Anion gap: 8 (ref 5–15)
Anion gap: 12 (ref 5–15)
BUN: 29 mg/dL — ABNORMAL HIGH (ref 8–23)
BUN: 30 mg/dL — ABNORMAL HIGH (ref 8–23)
CO2: 29 mmol/L (ref 22–32)
CO2: 26 mmol/L (ref 22–32)
Calcium: 9 mg/dL (ref 8.9–10.3)
Calcium: 9.7 mg/dL (ref 8.9–10.3)
Chloride: 105 mmol/L (ref 98–111)
Chloride: 106 mmol/L (ref 98–111)
Creatinine, Ser: 0.83 mg/dL (ref 0.61–1.24)
Creatinine, Ser: 0.72 mg/dL (ref 0.61–1.24)
GFR, Estimated: >60 mL/min (ref >60)
GFR, Estimated: >60 mL/min (ref >60)
Glucose, Bld: 110 mg/dL — ABNORMAL HIGH (ref 70–99)
Glucose, Bld: 150 mg/dL — ABNORMAL HIGH (ref 70–99)
Potassium: 4.1 mmol/L (ref 3.5–5.1)
Potassium: 4.1 mmol/L (ref 3.5–5.1)
Sodium: 142 mmol/L (ref 135–145)
Sodium: 144 mmol/L (ref 135–145)

## 2024-04-08 LAB — TRIGLYCERIDES: Triglycerides: 53 mg/dL (ref <150)

## 2024-04-09 DIAGNOSIS — Y95 Nosocomial condition: Secondary | ICD-10-CM

## 2024-04-09 DIAGNOSIS — R6520 Severe sepsis without septic shock: Secondary | ICD-10-CM

## 2024-04-09 DIAGNOSIS — J189 Pneumonia, unspecified organism: Secondary | ICD-10-CM

## 2024-04-09 DIAGNOSIS — J9621 Acute and chronic respiratory failure with hypoxia: Secondary | ICD-10-CM

## 2024-04-09 DIAGNOSIS — G931 Anoxic brain damage, not elsewhere classified: Secondary | ICD-10-CM

## 2024-04-09 DIAGNOSIS — Z930 Tracheostomy status: Secondary | ICD-10-CM

## 2024-04-10 DIAGNOSIS — Y95 Nosocomial condition: Secondary | ICD-10-CM

## 2024-04-10 DIAGNOSIS — J9621 Acute and chronic respiratory failure with hypoxia: Secondary | ICD-10-CM

## 2024-04-10 DIAGNOSIS — Z930 Tracheostomy status: Secondary | ICD-10-CM

## 2024-04-10 DIAGNOSIS — R6520 Severe sepsis without septic shock: Secondary | ICD-10-CM

## 2024-04-10 DIAGNOSIS — G931 Anoxic brain damage, not elsewhere classified: Secondary | ICD-10-CM

## 2024-04-10 DIAGNOSIS — J189 Pneumonia, unspecified organism: Secondary | ICD-10-CM

## 2024-04-11 DIAGNOSIS — Y95 Nosocomial condition: Secondary | ICD-10-CM

## 2024-04-11 DIAGNOSIS — R6520 Severe sepsis without septic shock: Secondary | ICD-10-CM

## 2024-04-11 DIAGNOSIS — G931 Anoxic brain damage, not elsewhere classified: Secondary | ICD-10-CM

## 2024-04-11 DIAGNOSIS — Z930 Tracheostomy status: Secondary | ICD-10-CM

## 2024-04-11 DIAGNOSIS — J9621 Acute and chronic respiratory failure with hypoxia: Secondary | ICD-10-CM

## 2024-04-11 DIAGNOSIS — J189 Pneumonia, unspecified organism: Secondary | ICD-10-CM

## 2024-04-12 DIAGNOSIS — Z930 Tracheostomy status: Secondary | ICD-10-CM

## 2024-04-12 DIAGNOSIS — J9621 Acute and chronic respiratory failure with hypoxia: Secondary | ICD-10-CM

## 2024-04-12 DIAGNOSIS — J189 Pneumonia, unspecified organism: Secondary | ICD-10-CM

## 2024-04-12 DIAGNOSIS — G931 Anoxic brain damage, not elsewhere classified: Secondary | ICD-10-CM

## 2024-04-12 DIAGNOSIS — Y95 Nosocomial condition: Secondary | ICD-10-CM

## 2024-04-12 DIAGNOSIS — R6520 Severe sepsis without septic shock: Secondary | ICD-10-CM

## 2024-04-13 ENCOUNTER — Institutional Professional Consult (permissible substitution) (HOSPITAL_COMMUNITY): Payer: Self-pay

## 2024-04-13 DIAGNOSIS — J189 Pneumonia, unspecified organism: Secondary | ICD-10-CM

## 2024-04-13 DIAGNOSIS — Y95 Nosocomial condition: Secondary | ICD-10-CM

## 2024-04-13 DIAGNOSIS — Z930 Tracheostomy status: Secondary | ICD-10-CM

## 2024-04-13 DIAGNOSIS — G931 Anoxic brain damage, not elsewhere classified: Secondary | ICD-10-CM

## 2024-04-13 DIAGNOSIS — J9621 Acute and chronic respiratory failure with hypoxia: Secondary | ICD-10-CM

## 2024-04-13 DIAGNOSIS — R6520 Severe sepsis without septic shock: Secondary | ICD-10-CM

## 2024-04-13 LAB — CBC
HCT: 28.3 % — ABNORMAL LOW (ref 39.0–52.0)
Hemoglobin: 8.9 g/dL — ABNORMAL LOW (ref 13.0–17.0)
MCH: 23.5 pg — ABNORMAL LOW (ref 26.0–34.0)
MCHC: 31.4 g/dL (ref 30.0–36.0)
MCV: 74.9 fL — ABNORMAL LOW (ref 80.0–100.0)
Platelets: 333 K/uL (ref 150–400)
RBC: 3.78 MIL/uL — ABNORMAL LOW (ref 4.22–5.81)
RDW: 17.2 % — ABNORMAL HIGH (ref 11.5–15.5)
WBC: 13.1 K/uL — ABNORMAL HIGH (ref 4.0–10.5)
nRBC: 0 % (ref 0.0–0.2)

## 2024-04-13 LAB — BASIC METABOLIC PANEL WITH GFR
Anion gap: 10 (ref 5–15)
BUN: 19 mg/dL (ref 8–23)
CO2: 25 mmol/L (ref 22–32)
Calcium: 9.3 mg/dL (ref 8.9–10.3)
Chloride: 109 mmol/L (ref 98–111)
Creatinine, Ser: 0.56 mg/dL — ABNORMAL LOW (ref 0.61–1.24)
GFR, Estimated: >60 mL/min (ref >60)
Glucose, Bld: 100 mg/dL — ABNORMAL HIGH (ref 70–99)
Potassium: 3.3 mmol/L — ABNORMAL LOW (ref 3.5–5.1)
Sodium: 144 mmol/L (ref 135–145)

## 2024-04-13 LAB — MAGNESIUM: Magnesium: 2 mg/dL (ref 1.7–2.4)

## 2024-04-14 DIAGNOSIS — Z930 Tracheostomy status: Secondary | ICD-10-CM

## 2024-04-14 DIAGNOSIS — G931 Anoxic brain damage, not elsewhere classified: Secondary | ICD-10-CM

## 2024-04-14 DIAGNOSIS — R6520 Severe sepsis without septic shock: Secondary | ICD-10-CM

## 2024-04-14 DIAGNOSIS — J9621 Acute and chronic respiratory failure with hypoxia: Secondary | ICD-10-CM

## 2024-04-14 DIAGNOSIS — Y95 Nosocomial condition: Secondary | ICD-10-CM

## 2024-04-14 DIAGNOSIS — J189 Pneumonia, unspecified organism: Secondary | ICD-10-CM

## 2024-04-14 LAB — POTASSIUM: Potassium: 3.7 mmol/L (ref 3.5–5.1)

## 2024-04-15 ENCOUNTER — Institutional Professional Consult (permissible substitution) (HOSPITAL_COMMUNITY)

## 2024-04-15 DIAGNOSIS — J9621 Acute and chronic respiratory failure with hypoxia: Secondary | ICD-10-CM

## 2024-04-15 DIAGNOSIS — G931 Anoxic brain damage, not elsewhere classified: Secondary | ICD-10-CM

## 2024-04-15 DIAGNOSIS — Z930 Tracheostomy status: Secondary | ICD-10-CM

## 2024-04-15 DIAGNOSIS — Y95 Nosocomial condition: Secondary | ICD-10-CM

## 2024-04-15 DIAGNOSIS — R6520 Severe sepsis without septic shock: Secondary | ICD-10-CM

## 2024-04-15 DIAGNOSIS — J189 Pneumonia, unspecified organism: Secondary | ICD-10-CM

## 2024-04-15 LAB — COMPREHENSIVE METABOLIC PANEL WITH GFR
ALT: 44 U/L (ref 0–44)
AST: 24 U/L (ref 15–41)
Albumin: 3.6 g/dL (ref 3.5–5.0)
Alkaline Phosphatase: 88 U/L (ref 38–126)
Anion gap: 11 (ref 5–15)
BUN: 26 mg/dL — ABNORMAL HIGH (ref 8–23)
CO2: 24 mmol/L (ref 22–32)
Calcium: 9.1 mg/dL (ref 8.9–10.3)
Chloride: 108 mmol/L (ref 98–111)
Creatinine, Ser: 0.63 mg/dL (ref 0.61–1.24)
GFR, Estimated: >60 mL/min (ref >60)
Glucose, Bld: 112 mg/dL — ABNORMAL HIGH (ref 70–99)
Potassium: 4.3 mmol/L (ref 3.5–5.1)
Sodium: 143 mmol/L (ref 135–145)
Total Bilirubin: 0.3 mg/dL (ref 0.0–1.2)
Total Protein: 7.2 g/dL (ref 6.5–8.1)

## 2024-04-15 LAB — CBC
HCT: 30.6 % — ABNORMAL LOW (ref 39.0–52.0)
Hemoglobin: 9.3 g/dL — ABNORMAL LOW (ref 13.0–17.0)
MCH: 23.4 pg — ABNORMAL LOW (ref 26.0–34.0)
MCHC: 30.4 g/dL (ref 30.0–36.0)
MCV: 76.9 fL — ABNORMAL LOW (ref 80.0–100.0)
Platelets: 266 K/uL (ref 150–400)
RBC: 3.98 MIL/uL — ABNORMAL LOW (ref 4.22–5.81)
RDW: 17.8 % — ABNORMAL HIGH (ref 11.5–15.5)
WBC: 13.8 K/uL — ABNORMAL HIGH (ref 4.0–10.5)
nRBC: 0 % (ref 0.0–0.2)

## 2024-04-15 LAB — TRIGLYCERIDES: Triglycerides: 49 mg/dL (ref <150)

## 2024-04-15 LAB — BRAIN NATRIURETIC PEPTIDE: B Natriuretic Peptide: 41.1 pg/mL (ref 0.0–100.0)

## 2024-04-16 DIAGNOSIS — Y95 Nosocomial condition: Secondary | ICD-10-CM

## 2024-04-16 DIAGNOSIS — Z930 Tracheostomy status: Secondary | ICD-10-CM

## 2024-04-16 DIAGNOSIS — R6520 Severe sepsis without septic shock: Secondary | ICD-10-CM

## 2024-04-16 DIAGNOSIS — G931 Anoxic brain damage, not elsewhere classified: Secondary | ICD-10-CM

## 2024-04-16 DIAGNOSIS — J9621 Acute and chronic respiratory failure with hypoxia: Secondary | ICD-10-CM

## 2024-04-16 DIAGNOSIS — J189 Pneumonia, unspecified organism: Secondary | ICD-10-CM

## 2024-04-17 ENCOUNTER — Institutional Professional Consult (permissible substitution) (HOSPITAL_COMMUNITY)

## 2024-04-17 DIAGNOSIS — R6520 Severe sepsis without septic shock: Secondary | ICD-10-CM

## 2024-04-17 DIAGNOSIS — Y95 Nosocomial condition: Secondary | ICD-10-CM

## 2024-04-17 DIAGNOSIS — J189 Pneumonia, unspecified organism: Secondary | ICD-10-CM

## 2024-04-17 DIAGNOSIS — Z930 Tracheostomy status: Secondary | ICD-10-CM

## 2024-04-17 DIAGNOSIS — G931 Anoxic brain damage, not elsewhere classified: Secondary | ICD-10-CM

## 2024-04-17 DIAGNOSIS — J9621 Acute and chronic respiratory failure with hypoxia: Secondary | ICD-10-CM

## 2024-04-18 DIAGNOSIS — Z930 Tracheostomy status: Secondary | ICD-10-CM

## 2024-04-18 DIAGNOSIS — Y95 Nosocomial condition: Secondary | ICD-10-CM

## 2024-04-18 DIAGNOSIS — G931 Anoxic brain damage, not elsewhere classified: Secondary | ICD-10-CM

## 2024-04-18 DIAGNOSIS — J189 Pneumonia, unspecified organism: Secondary | ICD-10-CM

## 2024-04-18 DIAGNOSIS — R6520 Severe sepsis without septic shock: Secondary | ICD-10-CM

## 2024-04-18 DIAGNOSIS — J9621 Acute and chronic respiratory failure with hypoxia: Secondary | ICD-10-CM

## 2024-04-19 DIAGNOSIS — R6520 Severe sepsis without septic shock: Secondary | ICD-10-CM

## 2024-04-19 DIAGNOSIS — J9621 Acute and chronic respiratory failure with hypoxia: Secondary | ICD-10-CM

## 2024-04-19 DIAGNOSIS — Y95 Nosocomial condition: Secondary | ICD-10-CM

## 2024-04-19 DIAGNOSIS — Z930 Tracheostomy status: Secondary | ICD-10-CM

## 2024-04-19 DIAGNOSIS — G931 Anoxic brain damage, not elsewhere classified: Secondary | ICD-10-CM

## 2024-04-19 DIAGNOSIS — J189 Pneumonia, unspecified organism: Secondary | ICD-10-CM

## 2024-04-20 ENCOUNTER — Institutional Professional Consult (permissible substitution) (HOSPITAL_COMMUNITY)

## 2024-04-20 DIAGNOSIS — R6520 Severe sepsis without septic shock: Secondary | ICD-10-CM

## 2024-04-20 DIAGNOSIS — Y95 Nosocomial condition: Secondary | ICD-10-CM

## 2024-04-20 DIAGNOSIS — J189 Pneumonia, unspecified organism: Secondary | ICD-10-CM

## 2024-04-20 DIAGNOSIS — G931 Anoxic brain damage, not elsewhere classified: Secondary | ICD-10-CM

## 2024-04-20 DIAGNOSIS — J9621 Acute and chronic respiratory failure with hypoxia: Secondary | ICD-10-CM

## 2024-04-20 DIAGNOSIS — Z930 Tracheostomy status: Secondary | ICD-10-CM

## 2024-04-20 LAB — BLOOD GAS, ARTERIAL
Acid-base deficit: 1 mmol/L (ref 0.0–2.0)
Bicarbonate: 22.2 mmol/L (ref 20.0–28.0)
O2 Saturation: 98.5 %
Patient temperature: 36.8
pCO2 arterial: 32 mmHg (ref 32–48)
pH, Arterial: 7.45 (ref 7.35–7.45)
pO2, Arterial: 84 mmHg (ref 83–108)

## 2024-04-21 DIAGNOSIS — Z930 Tracheostomy status: Secondary | ICD-10-CM

## 2024-04-21 DIAGNOSIS — R6520 Severe sepsis without septic shock: Secondary | ICD-10-CM

## 2024-04-21 DIAGNOSIS — J189 Pneumonia, unspecified organism: Secondary | ICD-10-CM

## 2024-04-21 DIAGNOSIS — G931 Anoxic brain damage, not elsewhere classified: Secondary | ICD-10-CM

## 2024-04-21 DIAGNOSIS — Y95 Nosocomial condition: Secondary | ICD-10-CM

## 2024-04-21 DIAGNOSIS — J9621 Acute and chronic respiratory failure with hypoxia: Secondary | ICD-10-CM

## 2024-04-22 DIAGNOSIS — R6520 Severe sepsis without septic shock: Secondary | ICD-10-CM

## 2024-04-22 DIAGNOSIS — Y95 Nosocomial condition: Secondary | ICD-10-CM

## 2024-04-22 DIAGNOSIS — G931 Anoxic brain damage, not elsewhere classified: Secondary | ICD-10-CM

## 2024-04-22 DIAGNOSIS — Z930 Tracheostomy status: Secondary | ICD-10-CM

## 2024-04-22 DIAGNOSIS — J189 Pneumonia, unspecified organism: Secondary | ICD-10-CM

## 2024-04-22 DIAGNOSIS — J9621 Acute and chronic respiratory failure with hypoxia: Secondary | ICD-10-CM

## 2024-04-23 ENCOUNTER — Institutional Professional Consult (permissible substitution) (HOSPITAL_COMMUNITY)

## 2024-04-23 DIAGNOSIS — J9621 Acute and chronic respiratory failure with hypoxia: Secondary | ICD-10-CM

## 2024-04-23 DIAGNOSIS — G931 Anoxic brain damage, not elsewhere classified: Secondary | ICD-10-CM

## 2024-04-23 DIAGNOSIS — R6520 Severe sepsis without septic shock: Secondary | ICD-10-CM

## 2024-04-23 DIAGNOSIS — Z930 Tracheostomy status: Secondary | ICD-10-CM

## 2024-04-23 DIAGNOSIS — Y95 Nosocomial condition: Secondary | ICD-10-CM

## 2024-04-23 DIAGNOSIS — J189 Pneumonia, unspecified organism: Secondary | ICD-10-CM

## 2024-04-24 ENCOUNTER — Institutional Professional Consult (permissible substitution) (HOSPITAL_COMMUNITY)

## 2024-04-24 DIAGNOSIS — Y95 Nosocomial condition: Secondary | ICD-10-CM

## 2024-04-24 DIAGNOSIS — R6520 Severe sepsis without septic shock: Secondary | ICD-10-CM

## 2024-04-24 DIAGNOSIS — J9621 Acute and chronic respiratory failure with hypoxia: Secondary | ICD-10-CM

## 2024-04-24 DIAGNOSIS — G931 Anoxic brain damage, not elsewhere classified: Secondary | ICD-10-CM

## 2024-04-24 DIAGNOSIS — J189 Pneumonia, unspecified organism: Secondary | ICD-10-CM

## 2024-04-24 DIAGNOSIS — Z930 Tracheostomy status: Secondary | ICD-10-CM

## 2024-04-24 LAB — BASIC METABOLIC PANEL WITH GFR
Anion gap: 11 (ref 5–15)
BUN: <5 mg/dL — ABNORMAL LOW (ref 8–23)
CO2: 26 mmol/L (ref 22–32)
Calcium: 9.1 mg/dL (ref 8.9–10.3)
Chloride: 102 mmol/L (ref 98–111)
Creatinine, Ser: 0.6 mg/dL — ABNORMAL LOW (ref 0.61–1.24)
GFR, Estimated: >60 mL/min (ref >60)
Glucose, Bld: 115 mg/dL — ABNORMAL HIGH (ref 70–99)
Potassium: 2.5 mmol/L — CL (ref 3.5–5.1)
Sodium: 139 mmol/L (ref 135–145)

## 2024-04-24 LAB — CBC
HCT: 28.4 % — ABNORMAL LOW (ref 39.0–52.0)
Hemoglobin: 9.3 g/dL — ABNORMAL LOW (ref 13.0–17.0)
MCH: 23.1 pg — ABNORMAL LOW (ref 26.0–34.0)
MCHC: 32.7 g/dL (ref 30.0–36.0)
MCV: 70.5 fL — ABNORMAL LOW (ref 80.0–100.0)
Platelets: 247 K/uL (ref 150–400)
RBC: 4.03 MIL/uL — ABNORMAL LOW (ref 4.22–5.81)
RDW: 18.8 % — ABNORMAL HIGH (ref 11.5–15.5)
WBC: 7.9 K/uL (ref 4.0–10.5)
nRBC: 0 % (ref 0.0–0.2)

## 2024-04-24 LAB — MAGNESIUM: Magnesium: 1.2 mg/dL — ABNORMAL LOW (ref 1.7–2.4)

## 2024-04-25 DIAGNOSIS — J189 Pneumonia, unspecified organism: Secondary | ICD-10-CM

## 2024-04-25 DIAGNOSIS — Z930 Tracheostomy status: Secondary | ICD-10-CM

## 2024-04-25 DIAGNOSIS — G931 Anoxic brain damage, not elsewhere classified: Secondary | ICD-10-CM

## 2024-04-25 DIAGNOSIS — J9621 Acute and chronic respiratory failure with hypoxia: Secondary | ICD-10-CM

## 2024-04-25 DIAGNOSIS — R6520 Severe sepsis without septic shock: Secondary | ICD-10-CM

## 2024-04-25 DIAGNOSIS — Y95 Nosocomial condition: Secondary | ICD-10-CM

## 2024-04-25 LAB — POTASSIUM: Potassium: 3.8 mmol/L (ref 3.5–5.1)

## 2024-04-25 LAB — MAGNESIUM: Magnesium: 1.9 mg/dL (ref 1.7–2.4)

## 2024-04-26 DIAGNOSIS — Z930 Tracheostomy status: Secondary | ICD-10-CM

## 2024-04-26 DIAGNOSIS — J9621 Acute and chronic respiratory failure with hypoxia: Secondary | ICD-10-CM

## 2024-04-26 DIAGNOSIS — G931 Anoxic brain damage, not elsewhere classified: Secondary | ICD-10-CM

## 2024-04-26 DIAGNOSIS — R6520 Severe sepsis without septic shock: Secondary | ICD-10-CM

## 2024-04-26 DIAGNOSIS — J189 Pneumonia, unspecified organism: Secondary | ICD-10-CM

## 2024-04-26 DIAGNOSIS — Y95 Nosocomial condition: Secondary | ICD-10-CM

## 2024-04-27 DIAGNOSIS — J9621 Acute and chronic respiratory failure with hypoxia: Secondary | ICD-10-CM

## 2024-04-27 DIAGNOSIS — R6520 Severe sepsis without septic shock: Secondary | ICD-10-CM

## 2024-04-27 DIAGNOSIS — J189 Pneumonia, unspecified organism: Secondary | ICD-10-CM

## 2024-04-27 DIAGNOSIS — G931 Anoxic brain damage, not elsewhere classified: Secondary | ICD-10-CM

## 2024-04-27 DIAGNOSIS — Y95 Nosocomial condition: Secondary | ICD-10-CM

## 2024-04-27 DIAGNOSIS — Z930 Tracheostomy status: Secondary | ICD-10-CM

## 2024-04-29 LAB — BASIC METABOLIC PANEL WITH GFR
Anion gap: 8 (ref 5–15)
BUN: 11 mg/dL (ref 8–23)
CO2: 24 mmol/L (ref 22–32)
Calcium: 9.2 mg/dL (ref 8.9–10.3)
Chloride: 101 mmol/L (ref 98–111)
Creatinine, Ser: 0.55 mg/dL — ABNORMAL LOW (ref 0.61–1.24)
GFR, Estimated: >60 mL/min (ref >60)
Glucose, Bld: 107 mg/dL — ABNORMAL HIGH (ref 70–99)
Potassium: 4.3 mmol/L (ref 3.5–5.1)
Sodium: 133 mmol/L — ABNORMAL LOW (ref 135–145)

## 2024-04-29 LAB — CBC
HCT: 25.7 % — ABNORMAL LOW (ref 39.0–52.0)
Hemoglobin: 8.1 g/dL — ABNORMAL LOW (ref 13.0–17.0)
MCH: 23 pg — ABNORMAL LOW (ref 26.0–34.0)
MCHC: 31.5 g/dL (ref 30.0–36.0)
MCV: 73 fL — ABNORMAL LOW (ref 80.0–100.0)
Platelets: 242 K/uL (ref 150–400)
RBC: 3.52 MIL/uL — ABNORMAL LOW (ref 4.22–5.81)
RDW: 19.9 % — ABNORMAL HIGH (ref 11.5–15.5)
WBC: 6.2 K/uL (ref 4.0–10.5)
nRBC: 0 % (ref 0.0–0.2)

## 2024-04-29 LAB — MAGNESIUM: Magnesium: 1.6 mg/dL — ABNORMAL LOW (ref 1.7–2.4)
